# Patient Record
Sex: Male | Born: 1935 | Race: White | Hispanic: No | Smoking: Former smoker
Health system: Southern US, Community
[De-identification: ages and names within clinical notes are randomized; demographics above are authoritative.]

## PROBLEM LIST (undated history)

## (undated) DIAGNOSIS — I313 Pericardial effusion (noninflammatory): Secondary | ICD-10-CM

## (undated) DIAGNOSIS — J309 Allergic rhinitis, unspecified: Secondary | ICD-10-CM

## (undated) DIAGNOSIS — F3289 Other specified depressive episodes: Secondary | ICD-10-CM

## (undated) DIAGNOSIS — E785 Hyperlipidemia, unspecified: Secondary | ICD-10-CM

## (undated) DIAGNOSIS — K5730 Diverticulosis of large intestine without perforation or abscess without bleeding: Secondary | ICD-10-CM

## (undated) DIAGNOSIS — F411 Generalized anxiety disorder: Secondary | ICD-10-CM

## (undated) DIAGNOSIS — I3139 Other pericardial effusion (noninflammatory): Secondary | ICD-10-CM

## (undated) DIAGNOSIS — F329 Major depressive disorder, single episode, unspecified: Secondary | ICD-10-CM

## (undated) DIAGNOSIS — I739 Peripheral vascular disease, unspecified: Secondary | ICD-10-CM

## (undated) DIAGNOSIS — N400 Enlarged prostate without lower urinary tract symptoms: Secondary | ICD-10-CM

## (undated) DIAGNOSIS — M109 Gout, unspecified: Secondary | ICD-10-CM

## (undated) DIAGNOSIS — H269 Unspecified cataract: Secondary | ICD-10-CM

## (undated) DIAGNOSIS — M1990 Unspecified osteoarthritis, unspecified site: Secondary | ICD-10-CM

## (undated) DIAGNOSIS — E119 Type 2 diabetes mellitus without complications: Secondary | ICD-10-CM

## (undated) DIAGNOSIS — I10 Essential (primary) hypertension: Secondary | ICD-10-CM

## (undated) DIAGNOSIS — K219 Gastro-esophageal reflux disease without esophagitis: Secondary | ICD-10-CM

## (undated) HISTORY — DX: Unspecified osteoarthritis, unspecified site: M19.90

## (undated) HISTORY — DX: Type 2 diabetes mellitus without complications: E11.9

## (undated) HISTORY — DX: Generalized anxiety disorder: F41.1

## (undated) HISTORY — DX: Other specified depressive episodes: F32.89

## (undated) HISTORY — DX: Allergic rhinitis, unspecified: J30.9

## (undated) HISTORY — DX: Major depressive disorder, single episode, unspecified: F32.9

## (undated) HISTORY — DX: Gout, unspecified: M10.9

## (undated) HISTORY — DX: Essential (primary) hypertension: I10

## (undated) HISTORY — DX: Hyperlipidemia, unspecified: E78.5

## (undated) HISTORY — DX: Unspecified cataract: H26.9

## (undated) HISTORY — DX: Gastro-esophageal reflux disease without esophagitis: K21.9

## (undated) HISTORY — DX: Benign prostatic hyperplasia without lower urinary tract symptoms: N40.0

## (undated) HISTORY — DX: Diverticulosis of large intestine without perforation or abscess without bleeding: K57.30

## (undated) HISTORY — DX: Peripheral vascular disease, unspecified: I73.9

---
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
## 1998-06-06 ENCOUNTER — Encounter: Payer: Self-pay | Admitting: *Deleted

## 1998-06-06 ENCOUNTER — Ambulatory Visit (HOSPITAL_COMMUNITY): Admission: RE | Admit: 1998-06-06 | Discharge: 1998-06-06 | Payer: Self-pay | Admitting: *Deleted

## 1998-07-09 ENCOUNTER — Encounter: Admission: RE | Admit: 1998-07-09 | Discharge: 1998-09-09 | Payer: Self-pay | Admitting: *Deleted

## 2000-06-10 ENCOUNTER — Encounter: Payer: Self-pay | Admitting: *Deleted

## 2000-06-10 ENCOUNTER — Ambulatory Visit (HOSPITAL_COMMUNITY): Admission: RE | Admit: 2000-06-10 | Discharge: 2000-06-10 | Payer: Self-pay | Admitting: *Deleted

## 2002-11-23 DIAGNOSIS — H269 Unspecified cataract: Secondary | ICD-10-CM

## 2003-06-12 DIAGNOSIS — I739 Peripheral vascular disease, unspecified: Secondary | ICD-10-CM

## 2003-10-12 ENCOUNTER — Encounter: Payer: Self-pay | Admitting: Internal Medicine

## 2003-10-18 ENCOUNTER — Ambulatory Visit (HOSPITAL_COMMUNITY): Admission: RE | Admit: 2003-10-18 | Discharge: 2003-10-18 | Payer: Self-pay | Admitting: Internal Medicine

## 2004-02-07 ENCOUNTER — Emergency Department (HOSPITAL_COMMUNITY): Admission: EM | Admit: 2004-02-07 | Discharge: 2004-02-07 | Payer: Self-pay | Admitting: Emergency Medicine

## 2004-06-13 DIAGNOSIS — F419 Anxiety disorder, unspecified: Secondary | ICD-10-CM

## 2005-10-16 ENCOUNTER — Encounter: Payer: Self-pay | Admitting: Internal Medicine

## 2005-10-16 LAB — CONVERTED CEMR LAB
Albumin: 3.9 g/dL
CO2: 25 meq/L
Chloride: 105 meq/L
Cholesterol: 168 mg/dL
Creatinine, Ser: 1.3 mg/dL
HDL: 50 mg/dL
LDL Cholesterol: 92 mg/dL
Potassium: 4.2 meq/L
Sodium: 143 meq/L
Total Protein: 6.1 g/dL
Triglyceride fasting, serum: 130 mg/dL

## 2006-01-20 DIAGNOSIS — R5381 Other malaise: Secondary | ICD-10-CM | POA: Insufficient documentation

## 2006-01-20 DIAGNOSIS — R5383 Other fatigue: Secondary | ICD-10-CM

## 2007-01-10 ENCOUNTER — Encounter: Payer: Self-pay | Admitting: Internal Medicine

## 2007-01-10 LAB — CONVERTED CEMR LAB
ALT: 18 units/L
AST: 22 units/L
CO2: 22 meq/L
Calcium: 9.3 mg/dL
Glucose, Bld: 128 mg/dL
HDL: 50 mg/dL
Hgb A1c MFr Bld: 5.6 %
LDL Cholesterol: 88 mg/dL
Potassium: 4.1 meq/L
Triglyceride fasting, serum: 119 mg/dL

## 2007-07-11 ENCOUNTER — Encounter: Payer: Self-pay | Admitting: Internal Medicine

## 2007-07-11 LAB — CONVERTED CEMR LAB
Platelets: 229 10*3/uL
WBC: 6.2 10*3/uL

## 2008-01-10 ENCOUNTER — Encounter: Payer: Self-pay | Admitting: Internal Medicine

## 2008-01-10 LAB — CONVERTED CEMR LAB
BUN: 28 mg/dL
Chloride: 109 meq/L
HDL: 54 mg/dL
LDL Cholesterol: 77 mg/dL
Potassium: 4.5 meq/L

## 2008-04-27 ENCOUNTER — Emergency Department (HOSPITAL_COMMUNITY): Admission: EM | Admit: 2008-04-27 | Discharge: 2008-04-27 | Payer: Self-pay | Admitting: Emergency Medicine

## 2008-06-03 ENCOUNTER — Encounter: Payer: Self-pay | Admitting: Internal Medicine

## 2009-01-07 ENCOUNTER — Encounter: Payer: Self-pay | Admitting: Internal Medicine

## 2009-01-07 LAB — CONVERTED CEMR LAB
BUN: 22 mg/dL
CO2: 26 meq/L
Calcium: 9.5 mg/dL
Chloride: 104 meq/L
Creatinine, Ser: 1.43 mg/dL
Glucose, Bld: 102 mg/dL
Hgb A1c MFr Bld: 5.9 %
Potassium: 4 meq/L
Sodium: 144 meq/L

## 2009-02-07 DIAGNOSIS — E119 Type 2 diabetes mellitus without complications: Secondary | ICD-10-CM | POA: Insufficient documentation

## 2009-02-07 DIAGNOSIS — M109 Gout, unspecified: Secondary | ICD-10-CM

## 2009-02-07 DIAGNOSIS — I10 Essential (primary) hypertension: Secondary | ICD-10-CM

## 2009-02-11 ENCOUNTER — Ambulatory Visit: Payer: Self-pay | Admitting: Internal Medicine

## 2009-02-11 DIAGNOSIS — J309 Allergic rhinitis, unspecified: Secondary | ICD-10-CM | POA: Insufficient documentation

## 2009-02-11 DIAGNOSIS — K219 Gastro-esophageal reflux disease without esophagitis: Secondary | ICD-10-CM | POA: Insufficient documentation

## 2009-02-11 DIAGNOSIS — N400 Enlarged prostate without lower urinary tract symptoms: Secondary | ICD-10-CM | POA: Insufficient documentation

## 2009-02-11 DIAGNOSIS — E785 Hyperlipidemia, unspecified: Secondary | ICD-10-CM

## 2009-02-11 DIAGNOSIS — M25519 Pain in unspecified shoulder: Secondary | ICD-10-CM

## 2009-03-19 ENCOUNTER — Encounter: Payer: Self-pay | Admitting: Internal Medicine

## 2009-03-19 DIAGNOSIS — K5730 Diverticulosis of large intestine without perforation or abscess without bleeding: Secondary | ICD-10-CM | POA: Insufficient documentation

## 2009-03-19 DIAGNOSIS — F329 Major depressive disorder, single episode, unspecified: Secondary | ICD-10-CM | POA: Insufficient documentation

## 2009-03-19 DIAGNOSIS — M1990 Unspecified osteoarthritis, unspecified site: Secondary | ICD-10-CM | POA: Insufficient documentation

## 2009-04-18 LAB — HM DIABETES EYE EXAM: HM Diabetic Eye Exam: NORMAL

## 2009-06-11 ENCOUNTER — Ambulatory Visit: Payer: Self-pay | Admitting: Internal Medicine

## 2009-09-17 ENCOUNTER — Ambulatory Visit: Payer: Self-pay | Admitting: Internal Medicine

## 2009-09-17 LAB — CONVERTED CEMR LAB
ALT: 13 units/L (ref 0–53)
BUN: 22 mg/dL (ref 6–23)
Basophils Relative: 0.2 % (ref 0.0–3.0)
Bilirubin, Direct: 0.2 mg/dL (ref 0.0–0.3)
CO2: 27 meq/L (ref 19–32)
Eosinophils Absolute: 0.3 10*3/uL (ref 0.0–0.7)
Eosinophils Relative: 3.2 % (ref 0.0–5.0)
GFR calc non Af Amer: 58.48 mL/min (ref >60)
Glucose, Bld: 107 mg/dL — ABNORMAL HIGH (ref 70–99)
HDL: 47.8 mg/dL (ref >39.00)
LDL Cholesterol: 75 mg/dL (ref 0–99)
Lymphocytes Relative: 16.9 % (ref 12.0–46.0)
Neutrophils Relative %: 72.8 % (ref 43.0–77.0)
Platelets: 202 10*3/uL (ref 150.0–400.0)
Potassium: 4.2 meq/L (ref 3.5–5.1)
RBC: 4.35 M/uL (ref 4.22–5.81)
Sodium: 146 meq/L — ABNORMAL HIGH (ref 135–145)
TSH: 1.47 microintl units/mL (ref 0.35–5.50)
Total Bilirubin: 0.6 mg/dL (ref 0.3–1.2)
VLDL: 14.8 mg/dL (ref 0.0–40.0)
WBC: 8.2 10*3/uL (ref 4.5–10.5)

## 2009-11-05 ENCOUNTER — Ambulatory Visit: Payer: Self-pay | Admitting: Internal Medicine

## 2010-01-06 ENCOUNTER — Telehealth: Payer: Self-pay | Admitting: Internal Medicine

## 2010-01-21 ENCOUNTER — Ambulatory Visit: Payer: Self-pay | Admitting: Internal Medicine

## 2010-03-04 ENCOUNTER — Ambulatory Visit
Admission: RE | Admit: 2010-03-04 | Discharge: 2010-03-04 | Payer: Self-pay | Source: Home / Self Care | Attending: Internal Medicine | Admitting: Internal Medicine

## 2010-03-13 NOTE — Assessment & Plan Note (Signed)
Summary: FLU SHOT Luis Glenn Natale Milch  Nurse Visit   Vitals Entered By: Orlan Leavens RMA (November 05, 2009 1:30 PM)  Allergies: 1)  ! Penicillin  Orders Added: 1)  Flu Vaccine 25yrs + MEDICARE PATIENTS [Q2039] 2)  Administration Flu vaccine - MCR [G0008] Flu Vaccine Consent Questions     Do you have a history of severe allergic reactions to this vaccine? no    Any prior history of allergic reactions to egg and/or gelatin? no    Do you have a sensitivity to the preservative Thimersol? no    Do you have a past history of Guillan-Barre Syndrome? no    Do you currently have an acute febrile illness? no    Have you ever had a severe reaction to latex? no    Vaccine information given and explained to patient? yes    Are you currently pregnant? no    Lot Number:[id number]   Exp Date:08/09/2010   Site Given  Left Deltoid IM  .lbmedflu

## 2010-03-13 NOTE — Letter (Signed)
Summary: [facility]  [facility]   Imported By: Lester Clarcona 03/21/2009 07:52:09  _____________________________________________________________________  External Attachment:    Type:   Image     Comment:   External Document

## 2010-03-13 NOTE — Assessment & Plan Note (Signed)
Summary: FU Natale Milch   Vital Signs:  Patient profile:   77 year old male Height:      74 inches (187.96 cm) Weight:      236.12 pounds (107.33 kg) O2 Sat:      95 % on Room air Temp:     98.0 degrees F (36.67 degrees C) oral Pulse rate:   81 / minute BP sitting:   120 / 62  (left arm) Cuff size:   large  Vitals Entered By: Orlan Leavens (Jun 11, 2009 2:38 PM)  O2 Flow:  Room air CC: cough/ nasal drainage Is Patient Diabetic? No Pain Assessment Patient in pain? no        Primary Care Provider:  Newt Lukes MD  CC:  cough/ nasal drainage.  History of Present Illness: here for followup -   1) c/o nasal congestion and dry cough symptoms worse at night - seasonal with spring allegies - has taken OTC afrin with good relief intially - now seems worse congestion than before - no fever, no sinus pain or pressure  2) DM2 - 100% compliant with meds as rx'd does not regularly check sugar at home - no symptoms hyper or hypo -glycemia -  3) HTN - reports compliance with ongoing medical treatment and no changes in medication dose or frequency. denies adverse side effects related to current therapy.  no CP, HA or vision changes - chroinc swellling in ankles without recent changes-   4) dyslipidemia - reports compliance with ongoing medical treatment and no changes in medication dose or frequency. denies adverse side effects related to current therapy.  not fasting today   Clinical Review Panels:  Immunizations   Last Tetanus Booster:  Historical (04/27/2008)   Last Flu Vaccine:  Historical (11/09/2008)   Last Pneumovax:  Historical (02/09/2002)  Lipid Management   Cholesterol:  147 (01/10/2008)   LDL (bad choesterol):  77 (01/10/2008)   HDL (good cholesterol):  54 (01/10/2008)   Triglycerides:  81 (01/10/2008)  Diabetes Management   HgBA1C:  5.9 (01/07/2009)   Creatinine:  1.43 (01/07/2009)   Last Dilated Eye Exam:  normal (04/18/2009)   Last Foot Exam:  yes  (06/11/2009)   Last Flu Vaccine:  Historical (11/09/2008)   Last Pneumovax:  Historical (02/09/2002)  CBC   WBC:  6.2 (07/11/2007)   Hgb:  13.3 (07/11/2007)   Hct:  38.7 (07/11/2007)   Platelets:  229 (07/11/2007)   MCV  89 (07/11/2007)  Complete Metabolic Panel   Glucose:  102 (01/07/2009)   Sodium:  144 (01/07/2009)   Potassium:  4.0 (01/07/2009)   Chloride:  104 (01/07/2009)   CO2:  26 (01/07/2009)   BUN:  22 (01/07/2009)   Creatinine:  1.43 (01/07/2009)   Albumin:  4.0 (01/10/2007)   Total Protein:  6.3 (01/10/2007)   Calcium:  9.5 (01/07/2009)   Total Bili:  0.5 (01/10/2007)   Alk Phos:  74 (01/10/2007)   SGPT (ALT):  18 (01/10/2007)   SGOT (AST):  22 (01/10/2007)   Current Medications (verified): 1)  Metformin Hcl 500 Mg Tabs (Metformin Hcl) .... Take 1 By Mouth Once Daily 2)  Lotrel 10-20 Mg Caps (Amlodipine Besy-Benazepril Hcl) .... Take 1 By Mouth Qd 3)  Furosemide 40 Mg Tabs (Furosemide) .... Take 1 By Mouth Qd 4)  Allopurinol 300 Mg Tabs (Allopurinol) .... Take 1 By Mouth Qd 5)  Simvastatin 40 Mg Tabs (Simvastatin) .... Take 1 By Mouth Qd 6)  Terazosin Hcl 10 Mg Caps (Terazosin  Hcl) .... Take 1 By Mouth Once Daily 7)  Sb Naproxen Sodium 220 Mg Tabs (Naproxen Sodium) .... Take 2 By Mouth Qd 8)  Lunesta 2 Mg Tabs (Eszopiclone) .... Take 1 At Bedtime As Needed 9)  Multivitamins  Tabs (Multiple Vitamin) .... Once Daily  Allergies (verified): 1)  ! Penicillin  Past History:  Past Medical History: Diabetes mellitus, type II Gout Hypertension Allergic rhinitis GERD Hyperlipidemia Benign prostatic hypertrophy Diverticulosis, colon Depression Osteoarthritis  Md rooster: - optho - hecker  Review of Systems  The patient denies weight loss, vision loss, decreased hearing, hoarseness, headaches, and abdominal pain.    Physical Exam  General:  alert, well-developed, well-nourished, and cooperative to examination.   overweight-appearing.   Eyes:  vision  grossly intact; pupils equal, round and reactive to light.  conjunctiva and lids normal.    Ears:  normal pinnae bilaterally, without erythema, swelling, or tenderness to palpation. TMs clear, without effusion, or cerumen impaction. Hearing grossly normal bilaterally  Mouth:  teeth and gums in good repair; mucous membranes moist, without lesions or ulcers. oropharynx clear without exudate, no erythema. +PND, clear Lungs:  normal respiratory effort, no intercostal retractions or use of accessory muscles; normal breath sounds bilaterally - no crackles and no wheezes.    Heart:  normal rate, regular rhythm, no murmur, and no rub. BLE with 1+ chroinc edema to mid-shins.  Neurologic:  alert & oriented X3 and cranial nerves II-XII symetrically intact.  strength normal in all extremities, sensation intact to light touch, and gait normal. speech fluent without dysarthria or aphasia; follows commands with good comprehension.   Diabetes Management Exam:    Foot Exam (with socks and/or shoes not present):       Sensory-Pinprick/Light touch:          Left medial foot (L-4): normal          Left dorsal foot (L-5): normal          Left lateral foot (S-1): normal          Right medial foot (L-4): normal          Right dorsal foot (L-5): normal          Right lateral foot (S-1): normal       Sensory-Monofilament:          Left foot: normal          Right foot: normal       Inspection:          Left foot: normal          Right foot: normal       Nails:          Left foot: normal          Right foot: normal   Impression & Recommendations:  Problem # 1:  DIABETES MELLITUS, TYPE II (ICD-250.00)  His updated medication list for this problem includes:    Metformin Hcl 500 Mg Tabs (Metformin hcl) .Marland Kitchen... Take 1 by mouth once daily    Lotrel 10-20 Mg Caps (Amlodipine besy-benazepril hcl) .Marland Kitchen... Take 1 by mouth qd  Orders: TLB-A1C / Hgb A1C (Glycohemoglobin) (83036-A1C) TLB-Creatinine, Blood  (82565-CREA)  Labs Reviewed: Creat: 1.43 (01/07/2009)     Last Eye Exam: normal (04/18/2009) Reviewed HgBA1c results: 5.9 (01/07/2009)  5.9 (01/10/2008)  Problem # 2:  HYPERTENSION (ICD-401.9)  His updated medication list for this problem includes:    Lotrel 10-20 Mg Caps (Amlodipine besy-benazepril hcl) .Marland Kitchen... Take 1 by mouth qd  Furosemide 40 Mg Tabs (Furosemide) .Marland Kitchen... Take 1 by mouth qd    Terazosin Hcl 10 Mg Caps (Terazosin hcl) .Marland Kitchen... Take 1 by mouth once daily  Orders: TLB-Creatinine, Blood (82565-CREA)  BP today: 120/62 Prior BP: 148/64 (02/11/2009)  Labs Reviewed: K+: 4.0 (01/07/2009) Creat: : 1.43 (01/07/2009)   Chol: 147 (01/10/2008)   HDL: 54 (01/10/2008)   LDL: 77 (01/10/2008)   TG: 81 (01/10/2008)  Problem # 3:  HYPERLIPIDEMIA (ICD-272.4)  not fasting now - will plan AM apt to check next vist -  prev values 2009 good - cont same  His updated medication list for this problem includes:    Simvastatin 40 Mg Tabs (Simvastatin) .Marland Kitchen... Take 1 by mouth qd  Labs Reviewed: SGOT: 22 (01/10/2007)   SGPT: 18 (01/10/2007)   HDL:54 (01/10/2008), 50 (01/10/2007)  LDL:77 (01/10/2008), 88 (01/10/2007)  Chol:147 (01/10/2008), 162 (01/10/2007)  Trig:81 (01/10/2008), 119 (01/10/2007)  Problem # 4:  ALLERGIC RHINITIS (ICD-477.9)  add meds for same and stop Afrin due to rebound and worsening of congestion His updated medication list for this problem includes:    Fluticasone Propionate 50 Mcg/act Susp (Fluticasone propionate) .Marland Kitchen... 2 sprays each nostril  every morning    Claritin 10 Mg Tabs (Loratadine) .Marland Kitchen... 1 by mouth once daily x 7 days, tehn as needed for allergy symptoms  Discussed use of allergy medications and environmental measures.   Orders: Prescription Created Electronically [phone])  Complete Medication List: 1)  Metformin Hcl 500 Mg Tabs (Metformin hcl) .... Take 1 by mouth once daily 2)  Lotrel 10-20 Mg Caps (Amlodipine besy-benazepril hcl) .... Take 1 by mouth  qd 3)  Furosemide 40 Mg Tabs (Furosemide) .... Take 1 by mouth qd 4)  Allopurinol 300 Mg Tabs (Allopurinol) .... Take 1 by mouth qd 5)  Simvastatin 40 Mg Tabs (Simvastatin) .... Take 1 by mouth qd 6)  Terazosin Hcl 10 Mg Caps (Terazosin hcl) .... Take 1 by mouth once daily 7)  Sb Naproxen Sodium 220 Mg Tabs (Naproxen sodium) .... Take 2 by mouth qd 8)  Zolpidem Tartrate 6.25 Mg Cr-tabs (Zolpidem tartrate) .Marland Kitchen.. 1 by mouth at bedtime as needed for sleep 9)  Multivitamins Tabs (Multiple vitamin) .... Once daily 10)  Fluticasone Propionate 50 Mcg/act Susp (Fluticasone propionate) .... 2 sprays each nostril  every morning 11)  Claritin 10 Mg Tabs (Loratadine) .Marland Kitchen.. 1 by mouth once daily x 7 days, tehn as needed for allergy symptoms  Patient Instructions: 1)  it was good to see you today. 2)  test(s) ordered today - your results will be posted on the phone tree for review in 48-72 hours from the time of test completion; call [phone] and enter your 9 digit MRN (listed above on this page, just below your name); if any changes need to be made or there are abnormal results, you will be contacted directly.  3)  add nasal steroids (generic flonase) and claritin for allergy symptoms - your prescriptions have been electronically submitted to your pharmacy. Please take as directed. Contact our office if you believe you're having problems with the medication(s). 4)  stop afrin 5)  change lunesta to generic extended release ambein - prescription provided 6)  Please schedule a follow-up appointment in 3-4 months, sooner if problems. come in AM fasting so we can check cholesterol next visit Prescriptions: CLARITIN 10 MG TABS (LORATADINE) 1 by mouth once daily x 7 days, tehn as needed for allergy symptoms  #30 x 2   Entered and Authorized by:  Newt Lukes MD   Signed by:   Newt Lukes MD on 06/11/2009   Method used:   Electronically to        [facility] [address] (retail)        [address]       [place], [place]  [address]       Ph: [phone]       Fax: [phone]   RxID:   [id number] FLUTICASONE PROPIONATE 50 MCG/ACT SUSP (FLUTICASONE PROPIONATE) 2 sprays each nostril  every morning  #1 x 5   Entered and Authorized by:   Newt Lukes MD   Signed by:   Newt Lukes MD on 06/11/2009   Method used:   Electronically to        [facility] [id number] (retail)       [address]       [place], [place]  [address]       Ph: [phone]       Fax: [phone]   RxID:   [id number] ZOLPIDEM TARTRATE 6.25 MG CR-TABS (ZOLPIDEM TARTRATE) 1 by mouth at bedtime as needed for sleep  #30 x 5   Entered and Authorized by:   Newt Lukes MD   Signed by:   Newt Lukes MD on 06/11/2009   Method used:   Print then Give to Patient   RxID:   [id number]

## 2010-03-13 NOTE — Letter (Signed)
Summary: [facility]   Imported By: Lester  03/21/2009 07:50:31  _____________________________________________________________________  External Attachment:    Type:   Image     Comment:   External Document

## 2010-03-13 NOTE — Assessment & Plan Note (Signed)
Summary: NEW MEDICARE PT --#--PKG--STC   Vital Signs:  Patient profile:   77 year old male Height:      74 inches (187.96 cm) Weight:      234.4 pounds (106.55 kg) BMI:     30.20 O2 Sat:      95 % on Room air Temp:     97.9 degrees F (36.61 degrees C) oral Pulse rate:   86 / minute BP sitting:   148 / 64  (left arm) Cuff size:   large  Vitals Entered By: Orlan Leavens (February 11, 2009 9:35 AM)  O2 Flow:  Room air CC: New patient Is Patient Diabetic? Yes Did you bring your meter with you today? No Pain Assessment Patient in pain? no        Primary Care Provider:  Newt Lukes MD  CC:  New patient.  History of Present Illness: new pt to me and to our divison - here to est care prev follwed with dr. Rondel Baton  1) DM2 - reports recent a1c 5.7 last month, never over 6 - does not regularly check sugar at home - no symptoms hyperglycemia - ?if he needs meds for this dx  2) HTN - reports compliance with ongoing medical treatment and no changes in medication dose or frequency. denies adverse side effects related to current therapy.  no CP, HA or vision changes - chroinc swellling in ankles without recent changes-   3) dyslipidemia - reports compliance with ongoing medical treatment and no changes in medication dose or frequency. denies adverse side effects related to current therapy.   4) c/o left shoulder pain - onset 04/2008 - pain initially precipiated by fall last spring -  pain is intermittently ptresent, induced by movement but not any consistent activity or motiotn - pain 3/10 when present "bugs me but not severe" - no radiation to chest, arm or hand -  no weakness not improved with ice, heat, position change -  not consitstent enough to try tylenol  Preventive Screening-Counseling & Management  Alcohol-Tobacco     Alcohol drinks/day: 1     Alcohol Counseling: not indicated; use of alcohol is not excessive or problematic     Smoking Status: never  Tobacco Counseling: not indicated; no tobacco use  Caffeine-Diet-Exercise     Caffeine Counseling: not indicated; caffeine use is not excessive or problematic     Does Patient Exercise: no     Exercise Counseling: to improve exercise regimen     Depression Counseling: not indicated; screening negative for depression  Clinical Review Panels:  Prevention   Last Colonoscopy:  Location:  [facility].  Finding: Diverticulosis of sigmoid colon otherwise unremarkable examination (06/10/2000)  Immunizations   Last Tetanus Booster:  Historical (04/27/2008)   Last Flu Vaccine:  Historical (11/09/2008)   Last Pneumovax:  Historical (02/09/2002)   Current Medications (verified): 1)  Metformin Hcl 500 Mg Tabs (Metformin Hcl) .... Take 1 Two Times A Day 2)  Lotrel 10-20 Mg Caps (Amlodipine Besy-Benazepril Hcl) .... Take 1 By Mouth Qd 3)  Furosemide 40 Mg Tabs (Furosemide) .... Take 1 By Mouth Qd 4)  Allopurinol 300 Mg Tabs (Allopurinol) .... Take 1 By Mouth Qd 5)  Simvastatin 40 Mg Tabs (Simvastatin) .... Take 1 By Mouth Qd 6)  Terazosin Hcl 10 Mg Caps (Terazosin Hcl) .... Take 1 By Mouth Once Daily 7)  Sb Naproxen Sodium 220 Mg Tabs (Naproxen Sodium) .... Take 2 By Mouth Qd 8)  Lunesta 2  Mg Tabs (Eszopiclone) .... Take 1 At Bedtime As Needed  Allergies (verified): 1)  ! Penicillin  Past History:  Past Medical History: Diabetes mellitus, type II Gout Hypertension Allergic rhinitis GERD Hyperlipidemia Benign prostatic hypertrophy  Past Surgical History: Tonsillectomy [phone])  Family History: Family History of Alcoholism/Addiction (other relative) Family History Breast cancer 1st degree relative <50 (other relativ) Family History Diabetes 1st degree relative ( parent) Family History Hypertension (parent)  Social History: Never Smoked married, lives with wife - occ alcohol use retiredSmoking Status:  never Does Patient Exercise:  no  Review of Systems       see  HPI above. I have reviewed all other systems and they were negative.   Physical Exam  General:  alert, well-developed, well-nourished, and cooperative to examination.   overweight-appearing.   Eyes:  vision grossly intact; pupils equal, round and reactive to light.  conjunctiva and lids normal.    Ears:  normal pinnae bilaterally, without erythema, swelling, or tenderness to palpation. TMs clear, without effusion, or cerumen impaction. Hearing grossly normal bilaterally  Mouth:  teeth and gums in good repair; mucous membranes moist, without lesions or ulcers. oropharynx clear without exudate, no erythema.  Lungs:  normal respiratory effort, no intercostal retractions or use of accessory muscles; normal breath sounds bilaterally - no crackles and no wheezes.    Heart:  normal rate, regular rhythm, no murmur, and no rub. BLE with 1+ chroinc edema to mid-shins. normal DP pulses and normal cap refill in all 4 extremities    Abdomen:  obese, soft, non-tender, normal bowel sounds, no distention; no masses and no appreciable hepatomegaly or splenomegaly.   Msk:  left shoulder: Full range of motion. full strength with testing rotator cuff. Negative impingement signs. Nontender to palpation but priominate anterior location of humeral head as compared to right. Neurovascularly intact Neurologic:  alert & oriented X3 and cranial nerves II-XII symetrically intact.  strength normal in all extremities, sensation intact to light touch, and gait normal. speech fluent without dysarthria or aphasia; follows commands with good comprehension.  Skin:  no rashes, vesicles, ulcers, or erythema. No nodules or irregularity to palpation.  Psych:  Oriented X3, memory intact for recent and remote, normally interactive, good eye contact, not anxious appearing, not depressed appearing, and not agitated.      Impression & Recommendations:  Problem # 1:  SHOULDER PAIN, LEFT, CHRONIC (ICD-719.41) ?remote fx or injury from fall  3/10 - ER record reviewed - shoulder not imaged at time of fall but other ct head/neck ok - check xray now -  FROM and not reproducible pain today so doubt benefit ot PT or NSAIDs/Tylenol on regular scheduled basis (other than as ongoing) further eval to depend on xray and course of symptoms - reassurance provided today His updated medication list for this problem includes:    Sb Naproxen Sodium 220 Mg Tabs (Naproxen sodium) .Marland Kitchen... Take 2 by mouth qd  Orders: T-Shoulder Left Min 2 Views (73030TC)  Problem # 2:  DIABETES MELLITUS, TYPE II (ICD-250.00) reports well controlled by a1c last mo - ok to reduce metformin to once daily dosing and recheck a1c next vuisit send for records to review from prior PCP His updated medication list for this problem includes:    Metformin Hcl 500 Mg Tabs (Metformin hcl) .Marland Kitchen... Take 1 by mouth once daily    Lotrel 10-20 Mg Caps (Amlodipine besy-benazepril hcl) .Marland Kitchen... Take 1 by mouth qd  Problem # 3:  HYPERTENSION (ICD-401.9) subop BP today  but reports generally well contorlled - no med change for now -  send for and review prior reciods - encouraged compliance and rech next OV 3-6 mos His updated medication list for this problem includes:    Lotrel 10-20 Mg Caps (Amlodipine besy-benazepril hcl) .Marland Kitchen... Take 1 by mouth qd    Furosemide 40 Mg Tabs (Furosemide) .Marland Kitchen... Take 1 by mouth qd    Terazosin Hcl 10 Mg Caps (Terazosin hcl) .Marland Kitchen... Take 1 by mouth once daily  BP today: 148/64  Problem # 4:  HYPERLIPIDEMIA (ICD-272.4) send for and review labs/old records - no med change rec for now His updated medication list for this problem includes:    Simvastatin 40 Mg Tabs (Simvastatin) .Marland Kitchen... Take 1 by mouth qd  Complete Medication List: 1)  Metformin Hcl 500 Mg Tabs (Metformin hcl) .... Take 1 by mouth once daily 2)  Lotrel 10-20 Mg Caps (Amlodipine besy-benazepril hcl) .... Take 1 by mouth qd 3)  Furosemide 40 Mg Tabs (Furosemide) .... Take 1 by mouth qd 4)   Allopurinol 300 Mg Tabs (Allopurinol) .... Take 1 by mouth qd 5)  Simvastatin 40 Mg Tabs (Simvastatin) .... Take 1 by mouth qd 6)  Terazosin Hcl 10 Mg Caps (Terazosin hcl) .... Take 1 by mouth once daily 7)  Sb Naproxen Sodium 220 Mg Tabs (Naproxen sodium) .... Take 2 by mouth qd 8)  Lunesta 2 Mg Tabs (Eszopiclone) .... Take 1 at bedtime as needed  Patient Instructions: 1)  it was good to see you today.  2)  xray of left shoulder ordered today - your results will be called to you in 48-72 hours from the time of test completion 3)  will send for old medical records (labs, notes, etc) from dr. Rondel Baton office to update our files - 4)  reduce your metformin to once daily as discussed  5)  Please schedule a follow-up appointment in 3-6 months, sooner if problems. well recheck labs at that time   Immunization History:  Tetanus/Td Immunization History:    Tetanus/Td:  historical (04/27/2008)  Influenza Immunization History:    Influenza:  historical (11/09/2008)  Pneumovax Immunization History:    Pneumovax:  historical (02/09/2002)

## 2010-03-13 NOTE — Assessment & Plan Note (Signed)
Summary: 6 weeks follow up-lb   Vital Signs:  Patient profile:   77 year old male Height:      74 inches (187.96 cm) Weight:      239.4 pounds (108.82 kg) O2 Sat:      95 % on Room air Temp:     97.4 degrees F (36.33 degrees C) oral Pulse rate:   73 / minute BP sitting:   148 / 70  (left arm) Cuff size:   large  Vitals Entered By: Orlan Leavens RMA (March 04, 2010 1:36 PM)  O2 Flow:  Room air CC: 6 week follow-up Is Patient Diabetic? Yes Did you bring your meter with you today? No Pain Assessment Patient in pain? no        Primary Care Provider:  Newt Lukes MD  CC:  6 week follow-up.  History of Present Illness: here for followup -   1) depression - manifest as irritability and fatigue complicated by exhaustion with care for wife -  started wellbutrin 01/2010 for same -  better sleep, no tearfulness, less overwhelming/unexplained sadness  2) DM2 - advised to stop metformin 09/2009 due to well controlled a1c - but has continued on same with diet mgmt- does not regularly check sugar at home - no symptoms hyper or hypo -glycemia -  3) HTN - reports compliance with ongoing medical treatment and no changes in medication dose or frequency. denies adverse side effects related to current therapy.  no CP, HA or vision changes - chronic swellling in ankles without recent changes-   4) dyslipidemia - reports compliance with ongoing medical treatment and no changes in medication dose or frequency. denies adverse side effects related to current therapy.  fasting today   Clinical Review Panels:  Diabetes Management   HgBA1C:  5.6 (09/17/2009)   Creatinine:  1.3 (09/17/2009)   Last Dilated Eye Exam:  normal (04/18/2009)   Last Foot Exam:  yes (06/11/2009)   Last Flu Vaccine:  Fluvax 3+ (11/05/2009)   Last Pneumovax:  Historical (02/09/2002)  CBC   WBC:  8.2 (09/17/2009)   RBC:  4.35 (09/17/2009)   Hgb:  13.6 (09/17/2009)   Hct:  40.1 (09/17/2009)   Platelets:   202.0 (09/17/2009)   MCV  92.2 (09/17/2009)   MCHC  33.9 (09/17/2009)   RDW  15.1 (09/17/2009)   PMN:  72.8 (09/17/2009)   Lymphs:  16.9 (09/17/2009)   Monos:  6.9 (09/17/2009)   Eosinophils:  3.2 (09/17/2009)   Basophil:  0.2 (09/17/2009)  Complete Metabolic Panel   Glucose:  107 (09/17/2009)   Sodium:  146 (09/17/2009)   Potassium:  4.2 (09/17/2009)   Chloride:  109 (09/17/2009)   CO2:  27 (09/17/2009)   BUN:  22 (09/17/2009)   Creatinine:  1.3 (09/17/2009)   Albumin:  3.9 (09/17/2009)   Total Protein:  6.3 (09/17/2009)   Calcium:  9.5 (09/17/2009)   Total Bili:  0.6 (09/17/2009)   Alk Phos:  84 (09/17/2009)   SGPT (ALT):  13 (09/17/2009)   SGOT (AST):  15 (09/17/2009)   Current Medications (verified): 1)  Lotrel 10-20 Mg Caps (Amlodipine Besy-Benazepril Hcl) .... Take 1 By Mouth Qd 2)  Furosemide 40 Mg Tabs (Furosemide) .... Take 1 By Mouth Qd 3)  Allopurinol 300 Mg Tabs (Allopurinol) .... Take 1 By Mouth Qd 4)  Simvastatin 40 Mg Tabs (Simvastatin) .... Take 1 By Mouth Qd 5)  Terazosin Hcl 10 Mg Caps (Terazosin Hcl) .... Take 1 By Mouth Once Daily 6)  Sb Naproxen Sodium 220 Mg Tabs (Naproxen Sodium) .... Take 2 By Mouth Qd 7)  Zolpidem Tartrate 6.25 Mg Cr-Tabs (Zolpidem Tartrate) .Marland Kitchen.. 1 By Mouth At Bedtime As Needed For Sleep 8)  Multivitamins  Tabs (Multiple Vitamin) .... Once Daily 9)  Metformin Hcl 500 Mg Tabs (Metformin Hcl) .Marland Kitchen.. 1 By Mouth Once Daily 10)  Wellbutrin Xl 150 Mg Xr24h-Tab (Bupropion Hcl) .Marland Kitchen.. 1 By Mouth Once Daily  Allergies (verified): 1)  ! Penicillin  Past History:  Past Medical History: Diabetes mellitus, type II Gout  Hypertension Allergic rhinitis GERD Hyperlipidemia Benign prostatic hypertrophy Diverticulosis, colon Depression Osteoarthritis    Md roster:  optho - hecker  Review of Systems  The patient denies anorexia, weight loss, chest pain, headaches, and abdominal pain.    Physical Exam  General:  alert, well-developed,  well-nourished, and cooperative to examination.   overweight-appearing.   Lungs:  normal respiratory effort, no intercostal retractions or use of accessory muscles; normal breath sounds bilaterally - no crackles and no wheezes.    Heart:  normal rate, regular rhythm, no murmur, and no rub. BLE with 1+ chroinc edema to mid-shins.  Psych:  Oriented X3, memory intact for recent and remote, normally interactive, good eye contact, not anxious appearing, not depressed appearing, and not agitated.      Impression & Recommendations:  Problem # 1:  DEPRESSION (ICD-311)  His updated medication list for this problem includes:    Wellbutrin Xl 150 Mg Xr24h-tab (Bupropion hcl) .Marland Kitchen... 1 by mouth once daily  situational and hx same - remotely on zoloft, started wellbutrin 01/2010 mild constipation SE but mood and irritability much improved - cont same  Complete Medication List: 1)  Lotrel 10-20 Mg Caps (Amlodipine besy-benazepril hcl) .... Take 1 by mouth qd 2)  Furosemide 40 Mg Tabs (Furosemide) .... Take 1 by mouth qd 3)  Allopurinol 300 Mg Tabs (Allopurinol) .... Take 1 by mouth qd 4)  Simvastatin 40 Mg Tabs (Simvastatin) .... Take 1 by mouth qd 5)  Terazosin Hcl 10 Mg Caps (Terazosin hcl) .... Take 1 by mouth once daily 6)  Sb Naproxen Sodium 220 Mg Tabs (Naproxen sodium) .... Take 2 by mouth qd 7)  Zolpidem Tartrate 6.25 Mg Cr-tabs (Zolpidem tartrate) .Marland Kitchen.. 1 by mouth at bedtime as needed for sleep 8)  Multivitamins Tabs (Multiple vitamin) .... Once daily 9)  Metformin Hcl 500 Mg Tabs (Metformin hcl) .Marland Kitchen.. 1 by mouth once daily 10)  Wellbutrin Xl 150 Mg Xr24h-tab (Bupropion hcl) .Marland Kitchen.. 1 by mouth once daily  Patient Instructions: 1)  it was good to see you today. 2)  continue wellbutrin for irritability - use miralax in AM and ducolax in PM to help counterbalance constipation symptoms as needed  3)  Please schedule a follow-up appointment in 3 months to review depression, diabetes and medications,  call sooner if problems.    Orders Added: 1)  Est. Patient Level III [29528]

## 2010-03-13 NOTE — Assessment & Plan Note (Signed)
Summary: 3-4 MO ROV /NWS  #   Vital Signs:  Patient profile:   77 year old male Height:      74 inches (187.96 cm) Weight:      245.8 pounds (111.73 kg) BMI:     31.67 O2 Sat:      97 % on Room air Temp:     96.0 degrees F (35.56 degrees C) oral Pulse rate:   71 / minute BP sitting:   130 / 60  (left arm) Cuff size:   large  Vitals Entered By: Orlan Leavens RMA (January 21, 2010 1:21 PM)  O2 Flow:  Room air CC: 3 month follow-up Is Patient Diabetic? Yes Did you bring your meter with you today? No Pain Assessment Patient in pain? no        Primary Care Provider:  Newt Lukes MD  CC:  3 month follow-up.  History of Present Illness: here for followup -   1) c/o fatigue- onset >6 months ago exhausted with care for wife -  admits to poor sleep but no tearfulness of overwhelming/unexplained sadness no prior hx depression but admits "i've wondered if that could be the problem sometimes" now ready to start med for same due to increase irritability -  2) DM2 - advised to stop metformin 09/2009 due to well controlled a1c - but has continued on same with diet mgmt- does not regularly check sugar at home - no symptoms hyper or hypo -glycemia -  3) HTN - reports compliance with ongoing medical treatment and no changes in medication dose or frequency. denies adverse side effects related to current therapy.  no CP, HA or vision changes - chronic swellling in ankles without recent changes-   4) dyslipidemia - reports compliance with ongoing medical treatment and no changes in medication dose or frequency. denies adverse side effects related to current therapy.  fasting today   Clinical Review Panels:  Lipid Management   Cholesterol:  138 (09/17/2009)   LDL (bad choesterol):  75 (09/17/2009)   HDL (good cholesterol):  47.80 (09/17/2009)   Triglycerides:  81 (01/10/2008)  Diabetes Management   HgBA1C:  5.6 (09/17/2009)   Creatinine:  1.3 (09/17/2009)   Last Dilated  Eye Exam:  normal (04/18/2009)   Last Foot Exam:  yes (06/11/2009)   Last Flu Vaccine:  Fluvax 3+ (11/05/2009)   Last Pneumovax:  Historical (02/09/2002)  CBC   WBC:  8.2 (09/17/2009)   RBC:  4.35 (09/17/2009)   Hgb:  13.6 (09/17/2009)   Hct:  40.1 (09/17/2009)   Platelets:  202.0 (09/17/2009)   MCV  92.2 (09/17/2009)   MCHC  33.9 (09/17/2009)   RDW  15.1 (09/17/2009)   PMN:  72.8 (09/17/2009)   Lymphs:  16.9 (09/17/2009)   Monos:  6.9 (09/17/2009)   Eosinophils:  3.2 (09/17/2009)   Basophil:  0.2 (09/17/2009)  Complete Metabolic Panel   Glucose:  107 (09/17/2009)   Sodium:  146 (09/17/2009)   Potassium:  4.2 (09/17/2009)   Chloride:  109 (09/17/2009)   CO2:  27 (09/17/2009)   BUN:  22 (09/17/2009)   Creatinine:  1.3 (09/17/2009)   Albumin:  3.9 (09/17/2009)   Total Protein:  6.3 (09/17/2009)   Calcium:  9.5 (09/17/2009)   Total Bili:  0.6 (09/17/2009)   Alk Phos:  84 (09/17/2009)   SGPT (ALT):  13 (09/17/2009)   SGOT (AST):  15 (09/17/2009)   Current Medications (verified): 1)  Lotrel 10-20 Mg Caps (Amlodipine Besy-Benazepril Hcl) .... Take 1  By Mouth Qd 2)  Furosemide 40 Mg Tabs (Furosemide) .... Take 1 By Mouth Qd 3)  Allopurinol 300 Mg Tabs (Allopurinol) .... Take 1 By Mouth Qd 4)  Simvastatin 40 Mg Tabs (Simvastatin) .... Take 1 By Mouth Qd 5)  Terazosin Hcl 10 Mg Caps (Terazosin Hcl) .... Take 1 By Mouth Once Daily 6)  Sb Naproxen Sodium 220 Mg Tabs (Naproxen Sodium) .... Take 2 By Mouth Qd 7)  Zolpidem Tartrate 6.25 Mg Cr-Tabs (Zolpidem Tartrate) .Marland Kitchen.. 1 By Mouth At Bedtime As Needed For Sleep 8)  Multivitamins  Tabs (Multiple Vitamin) .... Once Daily  Allergies (verified): 1)  ! Penicillin  Past History:  Past Medical History: Diabetes mellitus, type II Gout  Hypertension Allergic rhinitis GERD Hyperlipidemia Benign prostatic hypertrophy Diverticulosis, colon Depression Osteoarthritis   Md roster:  optho - hecker  Review of Systems  The  patient denies fever, weight loss, chest pain, and headaches.    Physical Exam  General:  alert, well-developed, well-nourished, and cooperative to examination.   overweight-appearing.   Lungs:  normal respiratory effort, no intercostal retractions or use of accessory muscles; normal breath sounds bilaterally - no crackles and no wheezes.    Heart:  normal rate, regular rhythm, no murmur, and no rub. BLE with 1+ chroinc edema to mid-shins.  Psych:  Oriented X3, memory intact for recent and remote, normally interactive, good eye contact, not anxious appearing, mildly depressed appearing, and not agitated.      Impression & Recommendations:  Problem # 1:  DIABETES MELLITUS, TYPE II (ICD-250.00)  His updated medication list for this problem includes:    Lotrel 10-20 Mg Caps (Amlodipine besy-benazepril hcl) .Marland Kitchen... Take 1 by mouth qd    Metformin Hcl 500 Mg Tabs (Metformin hcl) .Marland Kitchen... 1 by mouth once daily  last a1c great - continue same  Labs Reviewed: Creat: 1.3 (09/17/2009)     Last Eye Exam: normal (04/18/2009) Reviewed HgBA1c results: 5.6 (09/17/2009)  5.6 (06/11/2009)  Problem # 2:  DEPRESSION (ICD-311)  situational and hx same - remotely on zoloft start wellbutrin - potential and anticipated risks and benefits discussed - pt understands and agees His updated medication list for this problem includes:    Wellbutrin Xl 150 Mg Xr24h-tab (Bupropion hcl) .Marland Kitchen... 1 by mouth once daily  Orders: Prescription Created Electronically [id number])  Complete Medication List: 1)  Lotrel 10-20 Mg Caps (Amlodipine besy-benazepril hcl) .... Take 1 by mouth qd 2)  Furosemide 40 Mg Tabs (Furosemide) .... Take 1 by mouth qd 3)  Allopurinol 300 Mg Tabs (Allopurinol) .... Take 1 by mouth qd 4)  Simvastatin 40 Mg Tabs (Simvastatin) .... Take 1 by mouth qd 5)  Terazosin Hcl 10 Mg Caps (Terazosin hcl) .... Take 1 by mouth once daily 6)  Sb Naproxen Sodium 220 Mg Tabs (Naproxen sodium) .... Take 2 by mouth  qd 7)  Zolpidem Tartrate 6.25 Mg Cr-tabs (Zolpidem tartrate) .Marland Kitchen.. 1 by mouth at bedtime as needed for sleep 8)  Multivitamins Tabs (Multiple vitamin) .... Once daily 9)  Metformin Hcl 500 Mg Tabs (Metformin hcl) .Marland Kitchen.. 1 by mouth once daily 10)  Wellbutrin Xl 150 Mg Xr24h-tab (Bupropion hcl) .Marland Kitchen.. 1 by mouth once daily  Patient Instructions: 1)  it was good to see you today. 2)  continue metformin 3)  start wellbutrin for irritability 4)  your prescriptions and refills have been electronically submitted to your pharmacy. Please take as directed. Contact our office if you believe you're having problems with the medication(s).  5)  Please schedule a follow-up appointment in 6 weeks to review depression and medictions, call sooner if problems.  Prescriptions: WELLBUTRIN XL 150 MG XR24H-TAB (BUPROPION HCL) 1 by mouth once daily  #30 x 3   Entered and Authorized by:   Newt Lukes MD   Signed by:   Newt Lukes MD on 01/21/2010   Method used:   Electronically to        [facility] # [phone]* (retail)       [address]       [place], [place]  [address]       Ph: [phone]       Fax: [phone]   RxID:   [id number] METFORMIN HCL 500 MG TABS (METFORMIN HCL) 1 by mouth once daily  #30 x 6   Entered and Authorized by:   Newt Lukes MD   Signed by:   Newt Lukes MD on 01/21/2010   Method used:   Electronically to        [facility] # [phone]* (retail)       [address]       [place], [place]  [address]       Ph: [phone]       Fax: [phone]   RxID:   [id number]    Orders Added: 1)  Est. Patient Level IV [83151] 2)  Prescription Created Electronically [phone] [phone]

## 2010-03-13 NOTE — Assessment & Plan Note (Signed)
Summary: 3 mos f/u # / cd   Vital Signs:  Patient profile:   77 year old male Height:      74 inches (187.96 cm) Weight:      236.4 pounds (107.45 kg) O2 Sat:      94 % on Room air Temp:     97.6 degrees F (36.44 degrees C) oral Pulse rate:   68 / minute BP sitting:   158 / 62  (left arm) Cuff size:   regular  Vitals Entered By: Orlan Leavens RMA (September 17, 2009 8:46 AM)  O2 Flow:  Room air CC: 3 month follow-up Is Patient Diabetic? Yes Did you bring your meter with you today? No Pain Assessment Patient in pain? no        Primary Care Freeda Spivey:  Newt Lukes MD  CC:  3 month follow-up.  History of Present Illness: here for followup -   1) c/o fatigue- onset >6 months ago exhausted with care for wife -  admits to poor sleep but no tearfulness of overwhelming/unexplained sadness no prior hx depression but admits "i've wondered if that could be the problem sometimes"  2) DM2 - 100% compliant with meds as rx'd does not regularly check sugar at home - no symptoms hyper or hypo -glycemia - ?if might stop meds since last a1c so good?  3) HTN - reports compliance with ongoing medical treatment and no changes in medication dose or frequency. denies adverse side effects related to current therapy.  no CP, HA or vision changes - chronic swellling in ankles without recent changes-   4) dyslipidemia - reports compliance with ongoing medical treatment and no changes in medication dose or frequency. denies adverse side effects related to current therapy.  fasting today   Clinical Review Panels:  Prevention   Last Colonoscopy:  Location:  [facility].  Finding: Diverticulosis of sigmoid colon otherwise unremarkable examination (06/10/2000)  Lipid Management   Cholesterol:  147 (01/10/2008)   LDL (bad choesterol):  77 (01/10/2008)   HDL (good cholesterol):  54 (01/10/2008)   Triglycerides:  81 (01/10/2008)  Diabetes Management   HgBA1C:  5.6  (06/11/2009)   Creatinine:  1.4 (06/11/2009)   Last Dilated Eye Exam:  normal (04/18/2009)   Last Foot Exam:  yes (06/11/2009)   Last Flu Vaccine:  Historical (11/09/2008)   Last Pneumovax:  Historical (02/09/2002)  Complete Metabolic Panel   Glucose:  102 (01/07/2009)   Sodium:  144 (01/07/2009)   Potassium:  4.0 (01/07/2009)   Chloride:  104 (01/07/2009)   CO2:  26 (01/07/2009)   BUN:  22 (01/07/2009)   Creatinine:  1.4 (06/11/2009)   Albumin:  4.0 (01/10/2007)   Total Protein:  6.3 (01/10/2007)   Calcium:  9.5 (01/07/2009)   Total Bili:  0.5 (01/10/2007)   Alk Phos:  74 (01/10/2007)   SGPT (ALT):  18 (01/10/2007)   SGOT (AST):  22 (01/10/2007)   Current Medications (verified): 1)  Metformin Hcl 500 Mg Tabs (Metformin Hcl) .... Take 1 By Mouth Once Daily 2)  Lotrel 10-20 Mg Caps (Amlodipine Besy-Benazepril Hcl) .... Take 1 By Mouth Qd 3)  Furosemide 40 Mg Tabs (Furosemide) .... Take 1 By Mouth Qd 4)  Allopurinol 300 Mg Tabs (Allopurinol) .... Take 1 By Mouth Qd 5)  Simvastatin 40 Mg Tabs (Simvastatin) .... Take 1 By Mouth Qd 6)  Terazosin Hcl 10 Mg Caps (Terazosin Hcl) .... Take 1 By Mouth Once Daily 7)  Sb Naproxen Sodium 220  Mg Tabs (Naproxen Sodium) .... Take 2 By Mouth Qd 8)  Zolpidem Tartrate 6.25 Mg Cr-Tabs (Zolpidem Tartrate) .Marland Kitchen.. 1 By Mouth At Bedtime As Needed For Sleep 9)  Multivitamins  Tabs (Multiple Vitamin) .... Once Daily  Allergies (verified): 1)  ! Penicillin  Past History:  Past Medical History: Diabetes mellitus, type II Gout  Hypertension Allergic rhinitis GERD Hyperlipidemia Benign prostatic hypertrophy Diverticulosis, colon Depression Osteoarthritis  Md roster:  optho - hecker  Review of Systems  The patient denies weight loss, chest pain, syncope, headaches, and abdominal pain.    Physical Exam  General:  alert, well-developed, well-nourished, and cooperative to examination.   overweight-appearing.   Lungs:  normal respiratory  effort, no intercostal retractions or use of accessory muscles; normal breath sounds bilaterally - no crackles and no wheezes.    Heart:  normal rate, regular rhythm, no murmur, and no rub. BLE with 1+ chroinc edema to mid-shins.  Psych:  Oriented X3, memory intact for recent and remote, normally interactive, good eye contact, not anxious appearing, not depressed appearing, and not agitated.      Impression & Recommendations:  Problem # 1:  FATIGUE (ICD-780.79) situation depression likely with wofe's medical fraility and caring for her needs clinically, no depression requiring med tx at thsi time support offered - screening for poss underlying med illness with labs Orders: TLB-CBC Platelet - w/Differential (85025-CBCD) TLB-TSH (Thyroid Stimulating Hormone) (84443-TSH) TLB-Hepatic/Liver Function Pnl (80076-HEPATIC) TLB-BMP (Basic Metabolic Panel-BMET) (80048-METABOL)  Problem # 2:  DIABETES MELLITUS, TYPE II (ICD-250.00)  last a1c great - if still <6, will stop low dose metformin and try diet control His updated medication list for this problem includes:    Metformin Hcl 500 Mg Tabs (Metformin hcl) .Marland Kitchen... Take 1 by mouth once daily    Lotrel 10-20 Mg Caps (Amlodipine besy-benazepril hcl) .Marland Kitchen... Take 1 by mouth qd  Orders: TLB-BMP (Basic Metabolic Panel-BMET) (80048-METABOL) TLB-A1C / Hgb A1C (Glycohemoglobin) (83036-A1C)  Labs Reviewed: Creat: 1.4 (06/11/2009)     Last Eye Exam: normal (04/18/2009) Reviewed HgBA1c results: 5.6 (06/11/2009)  5.9 (01/07/2009)  Problem # 3:  HYPERTENSION (ICD-401.9)  elevated today but generally well controlled prev - asked pt to monitor BP at home and advise if running high - no med changes rec today His updated medication list for this problem includes:    Lotrel 10-20 Mg Caps (Amlodipine besy-benazepril hcl) .Marland Kitchen... Take 1 by mouth qd    Furosemide 40 Mg Tabs (Furosemide) .Marland Kitchen... Take 1 by mouth qd    Terazosin Hcl 10 Mg Caps (Terazosin hcl)  .Marland Kitchen... Take 1 by mouth once daily  BP today: 158/62 Prior BP: 120/62 (06/11/2009)  Labs Reviewed: K+: 4.0 (01/07/2009) Creat: : 1.4 (06/11/2009)   Chol: 147 (01/10/2008)   HDL: 54 (01/10/2008)   LDL: 77 (01/10/2008)   TG: 81 (01/10/2008)  Problem # 4:  HYPERLIPIDEMIA (ICD-272.4)  His updated medication list for this problem includes:    Simvastatin 40 Mg Tabs (Simvastatin) .Marland Kitchen... Take 1 by mouth qd  Orders: TLB-Lipid Panel (80061-LIPID)  Labs Reviewed: SGOT: 22 (01/10/2007)   SGPT: 18 (01/10/2007)   HDL:54 (01/10/2008), 50 (01/10/2007)  LDL:77 (01/10/2008), 88 (01/10/2007)  Chol:147 (01/10/2008), 162 (01/10/2007)  Trig:81 (01/10/2008), 119 (01/10/2007)  Complete Medication List: 1)  Metformin Hcl 500 Mg Tabs (Metformin hcl) .... Take 1 by mouth once daily 2)  Lotrel 10-20 Mg Caps (Amlodipine besy-benazepril hcl) .... Take 1 by mouth qd 3)  Furosemide 40 Mg Tabs (Furosemide) .... Take 1 by mouth qd 4)  Allopurinol 300 Mg Tabs (Allopurinol) .... Take 1 by mouth qd 5)  Simvastatin 40 Mg Tabs (Simvastatin) .... Take 1 by mouth qd 6)  Terazosin Hcl 10 Mg Caps (Terazosin hcl) .... Take 1 by mouth once daily 7)  Sb Naproxen Sodium 220 Mg Tabs (Naproxen sodium) .... Take 2 by mouth qd 8)  Zolpidem Tartrate 6.25 Mg Cr-tabs (Zolpidem tartrate) .Marland Kitchen.. 1 by mouth at bedtime as needed for sleep 9)  Multivitamins Tabs (Multiple vitamin) .... Once daily  Patient Instructions: 1)  it was good to see you today. 2)  test(s) ordered today - your results will be posted on the phone tree for review in 48-72 hours from the time of test completion; call [phone] and enter your 9 digit MRN (listed above on this page, just below your name); if any medicine changes need to be made or there are abnormal results, you will be contacted directly. 3)  no medication changes at this time - but will consider stopping metformin based on the lab results 4)  let us know if your fatigue and possible depression gets  any worse, please call for discussion on possible med treatment 5)  it is important that you work on losing weight, especially if we stop metformin - monitor your diet and consume fewer calories such as less carbohydrates (sugar) and less fat. you also need to increase your physical activity level - start by walking for 10-20 minutes 3 times per week and work up to 30 minutes 4-5 times each week.  6)  Please schedule a follow-up appointment in 3-4 months, sooner if problems.

## 2010-03-13 NOTE — Progress Notes (Signed)
Summary: Luis Glenn  Phone Note Refill Request Message from:  Fax from Pharmacy on January 06, 2010 11:10 AM  Refills Requested: Medication #1:  ZOLPIDEM TARTRATE 6.25 MG CR-TABS 1 by mouth at bedtime as needed for sleep # 30   Last Refilled: 12/02/2009 [phone] Last ov 10/23/09 Is this ok to refill?  Next Appointment Scheduled: 01/20/10 Initial call taken by: Orlan Leavens RMA,  January 06, 2010 11:11 AM  Follow-up for Phone Call        ok to fill as prev rx'd - thanks Follow-up by: Newt Lukes MD,  January 06, 2010 1:06 PM  Additional Follow-up for Phone Call Additional follow up Details #1::        [phone]. Updated EMR Additional Follow-up by: Orlan Leavens RMA,  January 06, 2010 1:11 PM    Prescriptions: ZOLPIDEM TARTRATE 6.25 MG CR-TABS (ZOLPIDEM TARTRATE) 1 by mouth at bedtime as needed for sleep  #30 x 5   Entered by:   Orlan Leavens RMA   Authorized by:   Newt Lukes MD   Signed by:   Orlan Leavens RMA on 01/06/2010   Method used:   Historical   RxID:   [id number]

## 2010-04-30 ENCOUNTER — Encounter: Payer: Self-pay | Admitting: *Deleted

## 2010-05-05 ENCOUNTER — Other Ambulatory Visit: Payer: Self-pay | Admitting: *Deleted

## 2010-05-05 ENCOUNTER — Telehealth: Payer: Self-pay | Admitting: *Deleted

## 2010-05-05 MED ORDER — SIMVASTATIN 40 MG PO TABS: 40.0000 mg | ORAL_TABLET | Freq: Every day | ORAL | Status: DC

## 2010-05-05 MED ORDER — TERAZOSIN HCL 10 MG PO CAPS: 10.0000 mg | ORAL_CAPSULE | Freq: Every day | ORAL | Status: DC

## 2010-05-05 NOTE — Telephone Encounter (Signed)
Faxed script to [phone] on his Simvastatin...05/05/10@1 :09pm/LMB

## 2010-05-05 NOTE — Telephone Encounter (Signed)
Sent 90 day to [facility]...05/05/10@10 :16am/LMB

## 2010-05-14 ENCOUNTER — Other Ambulatory Visit: Payer: Self-pay | Admitting: *Deleted

## 2010-05-14 MED ORDER — FUROSEMIDE 40 MG PO TABS: 40.0000 mg | ORAL_TABLET | Freq: Every day | ORAL | Status: DC

## 2010-05-14 MED ORDER — ALLOPURINOL 300 MG PO TABS: 300.0000 mg | ORAL_TABLET | Freq: Every day | ORAL | Status: DC

## 2010-05-14 NOTE — Telephone Encounter (Signed)
Sent refill to [facility]. EPIC updated..05/14/10@1 :19pm

## 2010-05-22 ENCOUNTER — Other Ambulatory Visit: Payer: Self-pay | Admitting: Internal Medicine

## 2010-06-06 ENCOUNTER — Encounter: Payer: Self-pay | Admitting: Internal Medicine

## 2010-06-06 ENCOUNTER — Ambulatory Visit (INDEPENDENT_AMBULATORY_CARE_PROVIDER_SITE_OTHER): Payer: Medicare Other | Admitting: Internal Medicine

## 2010-06-06 ENCOUNTER — Other Ambulatory Visit (INDEPENDENT_AMBULATORY_CARE_PROVIDER_SITE_OTHER): Payer: Medicare Other

## 2010-06-06 DIAGNOSIS — E119 Type 2 diabetes mellitus without complications: Secondary | ICD-10-CM

## 2010-06-06 DIAGNOSIS — F329 Major depressive disorder, single episode, unspecified: Secondary | ICD-10-CM

## 2010-06-06 DIAGNOSIS — F3289 Other specified depressive episodes: Secondary | ICD-10-CM

## 2010-06-06 DIAGNOSIS — I10 Essential (primary) hypertension: Secondary | ICD-10-CM

## 2010-06-06 DIAGNOSIS — E785 Hyperlipidemia, unspecified: Secondary | ICD-10-CM

## 2010-06-06 LAB — HEMOGLOBIN A1C: Hgb A1c MFr Bld: 5.5 % (ref 4.6–6.5)

## 2010-06-06 NOTE — Assessment & Plan Note (Signed)
Does not regularly check sugars - remains on low dose metformin  Check a1c today

## 2010-06-06 NOTE — Assessment & Plan Note (Signed)
Improved symptoms - ready to wean off wellbutrin - instructed on taper process Pt will call id recurrent depression or irritability symptoms

## 2010-06-06 NOTE — Patient Instructions (Signed)
It was good to see you today. Test(s) ordered today. Your results will be called to you after review (48-72hours after test completion). If any changes need to be made, you will be notified at that time. Taper off wellbutrin as discussed - call if recurrent depression symptoms  Medications reviewed, no other changes at this time.

## 2010-06-06 NOTE — Progress Notes (Signed)
  Subjective:    Patient ID: Luis Glenn, male    DOB: 02/19/1927, 77 y.o.   MRN: [id number]  HPI   here for followup - reviewed chronic medical issues:  depression - manifest as irritability and fatigue complicated by exhaustion with care for wife -  Remote tx zoloft not well tolerated started wellbutrin 01/2010 for same -  better sleep, no tearfulness, less overwhelming/unexplained sadness Ready to stop same due to constipation Se and feeling better  DM2 - advised to stop metformin 09/2009 due to well controlled a1c - but has continued on same with diet mgmt-  does not regularly check sugar at home - no symptoms hyper or hypo -glycemia -  HTN - reports compliance with ongoing medical treatment and no changes in medication dose or frequency. denies adverse side effects related to current therapy.  no CP, HA or vision changes - chronic swellling in ankles without recent changes-   dyslipidemia - reports compliance with ongoing medical treatment and no changes in medication dose or frequency. denies adverse side effects related to current therapy.  fasting today  Past Medical History  Diagnosis Date  . GOUT   . PERIPHERAL VASCULAR DISEASE   . DIVERTICULOSIS, COLON   . BENIGN PROSTATIC HYPERTROPHY   . DEPRESSION   . DIABETES MELLITUS, TYPE II   . GERD   . HYPERLIPIDEMIA   . HYPERTENSION   . CATARACTS   . OSTEOARTHRITIS   . ANXIETY   . ALLERGIC RHINITIS      Review of Systems  Constitutional: Negative for fever.  Respiratory: Negative for cough and shortness of breath.   Cardiovascular: Negative for chest pain and palpitations.  Neurological: Negative for syncope and headaches.       Objective:   Physical Exam  Constitutional: He is oriented to person, place, and time. He appears well-developed and well-nourished. No distress.  Cardiovascular: Normal rate, regular rhythm and normal heart sounds.   Pulmonary/Chest: Effort normal and breath sounds normal. No  respiratory distress. He has no wheezes.  Neurological: He is alert and oriented to person, place, and time. No cranial nerve deficit. Coordination normal.  Psychiatric: He has a normal mood and affect. His behavior is normal. Thought content normal.  BP 142/70  Pulse 95  Temp(Src) 97.9 F (36.6 C) (Oral)  Wt 234 lb 1.9 oz (106.196 kg)  SpO2 95% Lab Results  Component Value Date   WBC 8.2 09/17/2009   HGB 13.6 09/17/2009   HCT 40.1 09/17/2009   PLT 202.0 09/17/2009   CHOL 138 09/17/2009   TRIG 74.0 09/17/2009   HDL 47.80 09/17/2009   ALT 13 09/17/2009   AST 15 09/17/2009   NA 146* 09/17/2009   K 4.2 09/17/2009   CL 109 09/17/2009   CREATININE 1.3 09/17/2009   BUN 22 09/17/2009   CO2 27 09/17/2009   TSH 1.47 09/17/2009   HGBA1C 5.6 09/17/2009        Assessment & Plan:  See problem list. Medications and labs reviewed today.

## 2010-06-08 ENCOUNTER — Telehealth: Payer: Self-pay | Admitting: Internal Medicine

## 2010-06-08 NOTE — Telephone Encounter (Signed)
Lab Results  Component Value Date   HGBA1C 5.5 06/06/2010   Please call patient - normal or stable test results. No medication changes recommended. Thanks.

## 2010-06-08 NOTE — Assessment & Plan Note (Signed)
On statin - The current medical regimen is effective;  continue present plan and medications.  

## 2010-06-08 NOTE — Assessment & Plan Note (Signed)
The current medical regimen is effective;  continue present plan and medications. BP Readings from Last 3 Encounters:  06/06/10 142/70  03/04/10 148/70  01/21/10 130/60

## 2010-06-09 NOTE — Telephone Encounter (Signed)
Pt Notified with  Lab results.Marland KitchenMarland Kitchen4/30/12@12 :04pm/LMB

## 2010-06-27 NOTE — Procedures (Signed)
[facility]  Patient:    Luis Glenn, Luis Glenn                       MRN: [id number] Proc. Date: 06/10/00 Adm. Date:  [id number] Attending:  Sabino Gasser                           Procedure Report  PROCEDURE:  Colonoscopy.  INDICATION FOR PROCEDURE:  Rectal bleeding.  ANESTHESIA:  Demerol 120 mg, Versed 10 mg.  DESCRIPTION OF PROCEDURE:  With the patient mildly sedated in the left lateral decubitus position, the Olympus videoscopic colonoscope was inserted in the rectum and passed under direct vision to the hepatic flexure; however, once we got this distance we could not advance further without causing excessive discomfort of the patient and therefore it was elected to withdraw the colonoscope taking circumferential views of the colonic mucosa as best we could. The patients vital signs and pulse oximeter remained stable. The patient tolerated the procedure well without apparent complications.  FINDINGS:  Diverticulosis of sigmoid colon otherwise unremarkable examination.  PLAN:   Barium enema to complete this examination. DD:  06/10/00 TD:  06/10/00 Job: [id number]

## 2010-07-08 ENCOUNTER — Other Ambulatory Visit: Payer: Self-pay | Admitting: *Deleted

## 2010-07-08 MED ORDER — ZOLPIDEM TARTRATE ER 6.25 MG PO TBCR: 6.2500 mg | EXTENDED_RELEASE_TABLET | Freq: Every evening | ORAL | Status: DC | PRN

## 2010-07-08 NOTE — Telephone Encounter (Signed)
Faxed script back to [facility] @ [phone].Marland KitchenMarland Kitchen5/29/12@3 :49pm/LMB

## 2010-07-17 ENCOUNTER — Other Ambulatory Visit: Payer: Self-pay | Admitting: *Deleted

## 2010-07-17 MED ORDER — AMLODIPINE BESY-BENAZEPRIL HCL 10-20 MG PO CAPS: 1.0000 | ORAL_CAPSULE | Freq: Every day | ORAL | Status: DC

## 2010-07-17 NOTE — Telephone Encounter (Signed)
R'cd fax from [facility] for refill of Lotrel

## 2010-09-02 ENCOUNTER — Other Ambulatory Visit: Payer: Self-pay | Admitting: Internal Medicine

## 2010-09-02 ENCOUNTER — Other Ambulatory Visit: Payer: Self-pay | Admitting: *Deleted

## 2010-09-02 MED ORDER — ZOLPIDEM TARTRATE ER 6.25 MG PO TBCR: 6.2500 mg | EXTENDED_RELEASE_TABLET | Freq: Every evening | ORAL | Status: DC | PRN

## 2010-09-02 NOTE — Telephone Encounter (Signed)
Faxed script back to [facility] @ [phone],,,,09/02/10@1 :57pm/LMB

## 2010-09-03 ENCOUNTER — Encounter: Payer: Self-pay | Admitting: Internal Medicine

## 2010-09-03 ENCOUNTER — Ambulatory Visit (INDEPENDENT_AMBULATORY_CARE_PROVIDER_SITE_OTHER): Payer: Medicare Other | Admitting: Internal Medicine

## 2010-09-03 ENCOUNTER — Other Ambulatory Visit (INDEPENDENT_AMBULATORY_CARE_PROVIDER_SITE_OTHER): Payer: Medicare Other

## 2010-09-03 VITALS — BP 142/62 | HR 75 | Temp 98.3°F | Ht 74.0 in | Wt 237.0 lb

## 2010-09-03 DIAGNOSIS — E119 Type 2 diabetes mellitus without complications: Secondary | ICD-10-CM

## 2010-09-03 DIAGNOSIS — F3289 Other specified depressive episodes: Secondary | ICD-10-CM

## 2010-09-03 DIAGNOSIS — F329 Major depressive disorder, single episode, unspecified: Secondary | ICD-10-CM

## 2010-09-03 DIAGNOSIS — I10 Essential (primary) hypertension: Secondary | ICD-10-CM

## 2010-09-03 DIAGNOSIS — E785 Hyperlipidemia, unspecified: Secondary | ICD-10-CM

## 2010-09-03 LAB — HEMOGLOBIN A1C: Hgb A1c MFr Bld: 5.5 % (ref 4.6–6.5)

## 2010-09-03 NOTE — Patient Instructions (Addendum)
It was good to see you today. Test(s) ordered today. Your results will be called to you after review (48-72hours after test completion). If any changes need to be made, you will be notified at that time. Consider new mattress as discussed - call if worsening back arthritis or other pain symptoms  Medications reviewed, no other changes at this time. Please schedule followup in 4 months, call sooner if problems. Will check a1c for diabetes as well as cholesterol next visit

## 2010-09-03 NOTE — Assessment & Plan Note (Signed)
Does not regularly check sugars - remains on low dose metformin  Check a1c today

## 2010-09-03 NOTE — Progress Notes (Signed)
Subjective:    Patient ID: Luis Glenn, male    DOB: 23-Oct-1927, 77 y.o.   MRN: [id number]  HPI here for followup - reviewed chronic medical issues:  Depression hx - manifest as irritability and fatigue complicated by exhaustion with care for wife -  Remote tx zoloft not well tolerated; tx\ wellbutrin 01/2010-05/2010 for same -  better sleep, no tearfulness, less overwhelming/unexplained sadness D/c'd same due to constipation Se and feeling better - no recurrence of depression at this time off med  DM2 - advised to stop metformin 09/2009 due to well controlled a1c - but has continued on same with diet mgmt-  does not regularly check sugar at home - no symptoms hyper or hypo -glycemia -  HTN - reports compliance with ongoing medical treatment and no changes in medication dose or frequency. denies adverse side effects related to current therapy.  no CP, HA or vision changes - chronic swellling in ankles without recent changes-   dyslipidemia - on simva for years -reports compliance with ongoing medical treatment and no changes in medication dose or frequency. denies adverse side effects related to current therapy.     Past Medical History  Diagnosis Date  . GOUT   . PERIPHERAL VASCULAR DISEASE   . DIVERTICULOSIS, COLON   . BENIGN PROSTATIC HYPERTROPHY   . DEPRESSION   . DIABETES MELLITUS, TYPE II   . GERD   . HYPERLIPIDEMIA   . HYPERTENSION   . CATARACTS   . OSTEOARTHRITIS   . ANXIETY   . ALLERGIC RHINITIS      Review of Systems  Constitutional: Negative for fever.  Respiratory: Negative for cough and shortness of breath.   Cardiovascular: Negative for chest pain and palpitations.  Neurological: Negative for syncope and headaches.       Objective:   Physical Exam  BP 142/62  Pulse 75  Temp(Src) 98.3 F (36.8 C) (Oral)  Ht 6\' 2"  (1.88 m)  Wt 237 lb (107.502 kg)  BMI 30.43 kg/m2  SpO2 97% Wt Readings from Last 3 Encounters:  09/03/10 237 lb (107.502 kg)    06/06/10 234 lb 1.9 oz (106.196 kg)  03/04/10 239 lb 6.4 oz (108.591 kg)   Constitutional: overweight; oriented to person, place, and time. appears well-developed and well-nourished. No distress.  Wife at side Neck: Normal range of motion. Neck supple. No JVD present. No thyromegaly present.  Cardiovascular: Normal rate, regular rhythm and normal heart sounds.  No murmur heard. 1+ BLE edema, R>L (chronic) Pulmonary/Chest: Effort normal and breath sounds normal. No respiratory distress. no wheezes.  Neurological: he is alert and oriented x3. No cranial nerve deficit. Coordination and gait normal; MAE well, no dysarthria and language fluent Skin: Skin is warm and dry.  No erythema or ulceration. SKs present Psychiatric: he has a normal mood and affect. behavior is normal. Judgment and thought content normal.     Lab Results  Component Value Date   WBC 8.2 09/17/2009   HGB 13.6 09/17/2009   HCT 40.1 09/17/2009   PLT 202.0 09/17/2009   CHOL 138 09/17/2009   TRIG 74.0 09/17/2009   HDL 47.80 09/17/2009   ALT 13 09/17/2009   AST 15 09/17/2009   NA 146* 09/17/2009   K 4.2 09/17/2009   CL 109 09/17/2009   CREATININE 1.3 09/17/2009   BUN 22 09/17/2009   CO2 27 09/17/2009   TSH 1.47 09/17/2009   HGBA1C 5.5 06/06/2010        Assessment & Plan:  See  problem list. Medications and labs reviewed today.

## 2010-09-04 NOTE — Assessment & Plan Note (Signed)
Stable symptoms off wellbutrin - tx 01/2010-05/2010 with same Remote tx sertraline trial not well tol Pt will call if recurrent depression or irritability symptoms

## 2010-09-04 NOTE — Assessment & Plan Note (Signed)
On simvastatin - last lipids at goal The current medical regimen is effective;  continue present plan and medications. 

## 2010-09-04 NOTE — Assessment & Plan Note (Signed)
The current medical regimen is reasonably effective;  continue present plan and medications.  BP Readings from Last 3 Encounters:  09/03/10 142/62  06/06/10 142/70  03/04/10 148/70

## 2010-11-01 ENCOUNTER — Other Ambulatory Visit: Payer: Self-pay | Admitting: Internal Medicine

## 2010-11-03 ENCOUNTER — Other Ambulatory Visit: Payer: Self-pay | Admitting: *Deleted

## 2010-11-03 MED ORDER — ZOLPIDEM TARTRATE ER 6.25 MG PO TBCR: 6.2500 mg | EXTENDED_RELEASE_TABLET | Freq: Every evening | ORAL | Status: DC | PRN

## 2010-11-03 NOTE — Telephone Encounter (Signed)
Faxed script back to [facility] @ [phone].Marland KitchenMarland Kitchen9/24/12@1 :09pm/LMB

## 2010-11-11 ENCOUNTER — Other Ambulatory Visit: Payer: Self-pay | Admitting: Internal Medicine

## 2010-11-12 ENCOUNTER — Ambulatory Visit (INDEPENDENT_AMBULATORY_CARE_PROVIDER_SITE_OTHER): Payer: Medicare Other | Admitting: Internal Medicine

## 2010-11-12 ENCOUNTER — Encounter: Payer: Self-pay | Admitting: Internal Medicine

## 2010-11-12 VITALS — BP 152/60 | HR 79 | Temp 98.6°F | Wt 238.8 lb

## 2010-11-12 DIAGNOSIS — Z23 Encounter for immunization: Secondary | ICD-10-CM

## 2010-11-12 DIAGNOSIS — F329 Major depressive disorder, single episode, unspecified: Secondary | ICD-10-CM

## 2010-11-12 MED ORDER — SERTRALINE HCL 25 MG PO TABS: 25.0000 mg | ORAL_TABLET | Freq: Every day | ORAL | Status: DC

## 2010-11-12 NOTE — Assessment & Plan Note (Signed)
Does not regularly check sugars - remains on low dose metformin due to pt pref though suspect could be "diet controlled" Check a1c today Lab Results  Component Value Date   HGBA1C 5.5 09/03/2010    

## 2010-11-12 NOTE — Assessment & Plan Note (Signed)
On simvastatin - last lipids at goal The current medical regimen is effective;  continue present plan and medications. Check lipids now

## 2010-11-12 NOTE — Progress Notes (Signed)
Subjective:    Patient ID: Luis Glenn, male    DOB: [birth year], 77 y.o.   MRN: [id number]  HPI here for depression: Depression hx - manifest as irritability and fatigue complicated by exhaustion with care for wife -  Remote tx zoloft; tx wellbutrin 01/2010-05/2010 for same -  better sleep, no tearfulness, less overwhelming/unexplained sadness D/c'd wellbutrin due to constipation side effects and feeling better - Now recurrence of depression as noted by family (dtr who is nurse) -  "short fuse", tearful - no HI or SI  Also reviewed chronic medical issues: DM2 - advised to stop metformin 09/2009 due to well controlled a1c - but has continued on same with diet mgmt-  does not regularly check sugar at home - no symptoms hyper or hypo -glycemia -  HTN - reports compliance with ongoing medical treatment and no changes in medication dose or frequency. denies adverse side effects related to current therapy.  no CP, HA or vision changes - chronic swellling in ankles without recent changes-   dyslipidemia - on simva for years -reports compliance with ongoing medical treatment and no changes in medication dose or frequency. denies adverse side effects related to current therapy.     Past Medical History  Diagnosis Date  . GOUT   . PERIPHERAL VASCULAR DISEASE   . DIVERTICULOSIS, COLON   . BENIGN PROSTATIC HYPERTROPHY   . DEPRESSION   . DIABETES MELLITUS, TYPE II   . GERD   . HYPERLIPIDEMIA   . HYPERTENSION   . CATARACTS   . OSTEOARTHRITIS   . ANXIETY   . ALLERGIC RHINITIS      Review of Systems  Constitutional: Negative for fever.  Respiratory: Negative for cough and shortness of breath.   Cardiovascular: Negative for chest pain and palpitations.  Neurological: Negative for syncope and headaches.       Objective:   Physical Exam BP 152/60  Pulse 79  Temp(Src) 98.6 F (37 C) (Oral)  Wt 238 lb 12.8 oz (108.319 kg)  SpO2 93% Wt Readings from Last 3 Encounters:  11/12/10  238 lb 12.8 oz (108.319 kg)  09/03/10 237 lb (107.502 kg)  06/06/10 234 lb 1.9 oz (106.196 kg)   Constitutional: overweight; oriented to person, place, and time. appears well-developed and well-nourished. No distress.   Neck: Normal range of motion. Neck supple. No JVD present. No thyromegaly present.  Cardiovascular: Normal rate, regular rhythm and normal heart sounds.  No murmur heard. 1+ BLE edema, R>L (chronic) Pulmonary/Chest: Effort normal and breath sounds normal. No respiratory distress. no wheezes.  Neurological: he is alert and oriented x3. No cranial nerve deficit. Coordination and gait normal; MAE well, no dysarthria and language fluent Skin: Skin is warm and dry.  No erythema or ulceration. SKs present Psychiatric: he has depressed mood and affect. behavior is normal. Judgment and thought content normal.    Lab Results  Component Value Date   WBC 8.2 09/17/2009   HGB 13.6 09/17/2009   HCT 40.1 09/17/2009   PLT 202.0 09/17/2009   CHOL 138 09/17/2009   TRIG 74.0 09/17/2009   HDL 47.80 09/17/2009   ALT 13 09/17/2009   AST 15 09/17/2009   NA 146* 09/17/2009   K 4.2 09/17/2009   CL 109 09/17/2009   CREATININE 1.3 09/17/2009   BUN 22 09/17/2009   CO2 27 09/17/2009   TSH 1.47 09/17/2009   HGBA1C 5.5 09/03/2010        Assessment & Plan:  See problem list. Medications and  labs reviewed today.

## 2010-11-12 NOTE — Assessment & Plan Note (Signed)
Hx same wellbutrin - tx 01/2010-05/2010 with same Remote tx sertraline - would like to try again now - erx done Pt will call if worsening depression or irritability symptoms or new SI/SI follow up 4-6 wk

## 2010-11-12 NOTE — Assessment & Plan Note (Signed)
The current medical regimen is reasonably effective;  continue present plan and medications.  BP Readings from Last 3 Encounters:  11/12/10 152/60  09/03/10 142/62  06/06/10 142/70

## 2010-11-12 NOTE — Patient Instructions (Addendum)
It was good to see you today. Start sertraline for depression symptoms - Your prescription(s) have been submitted to your pharmacy. Please take as directed and contact our office if you believe you are having problem(s) with the medication(s). Call if symptoms worse after starting this medication as we discussed Keep appt Nov as sched - will check diabetes mellitus and cholesterol labs that visit

## 2010-12-22 ENCOUNTER — Other Ambulatory Visit (INDEPENDENT_AMBULATORY_CARE_PROVIDER_SITE_OTHER): Payer: Medicare Other

## 2010-12-22 ENCOUNTER — Ambulatory Visit (INDEPENDENT_AMBULATORY_CARE_PROVIDER_SITE_OTHER): Payer: Medicare Other | Admitting: Internal Medicine

## 2010-12-22 ENCOUNTER — Encounter: Payer: Self-pay | Admitting: Internal Medicine

## 2010-12-22 VITALS — BP 142/72 | HR 75 | Temp 97.7°F | Resp 16 | Wt 229.5 lb

## 2010-12-22 DIAGNOSIS — I10 Essential (primary) hypertension: Secondary | ICD-10-CM

## 2010-12-22 DIAGNOSIS — F329 Major depressive disorder, single episode, unspecified: Secondary | ICD-10-CM

## 2010-12-22 DIAGNOSIS — E785 Hyperlipidemia, unspecified: Secondary | ICD-10-CM

## 2010-12-22 DIAGNOSIS — E119 Type 2 diabetes mellitus without complications: Secondary | ICD-10-CM

## 2010-12-22 MED ORDER — SERTRALINE HCL 50 MG PO TABS: 50.0000 mg | ORAL_TABLET | Freq: Every day | ORAL | Status: DC

## 2010-12-22 NOTE — Assessment & Plan Note (Signed)
On simvastatin - last lipids at goal The current medical regimen is effective;  continue present plan and medications.

## 2010-12-22 NOTE — Assessment & Plan Note (Signed)
Hx same wellbutrin - tx 01/2010-05/2010 with same Remote tx sertraline - resumed same 11/2010 - increase dose now Pt will call if worsening depression or irritability symptoms or new SI/SI

## 2010-12-22 NOTE — Patient Instructions (Signed)
It was good to see you today. Increase dose sertraline for depression symptoms - Your prescription(s) have been submitted to your pharmacy. Please take as directed and contact our office if you believe you are having problem(s) with the medication(s). Test(s) ordered today. Your results will be called to you after review (48-72hours after test completion). If any changes need to be made, you will be notified at that time. Please schedule followup in 4-6 months for diabetes mellitus check, call sooner if problems.

## 2010-12-22 NOTE — Assessment & Plan Note (Signed)
The current medical regimen is reasonably effective;  continue present plan and medications.  BP Readings from Last 3 Encounters:  12/22/10 142/72  11/12/10 152/60  09/03/10 142/62

## 2010-12-22 NOTE — Assessment & Plan Note (Signed)
Does not regularly check sugars - remains on low dose metformin due to pt pref though suspect could be "diet controlled" Check a1c today Lab Results  Component Value Date   HGBA1C 5.5 09/03/2010

## 2010-12-22 NOTE — Progress Notes (Signed)
Subjective:    Patient ID: Luis Glenn, male    DOB: 1927-07-04, 77 y.o.   MRN: [id number]  HPI here for followup- reviewed chronic medical issues today:  depression: Depression hx - manifest as irritability and fatigue complicated by exhaustion with care for wife -  Remote tx zoloft; tx wellbutrin 01/2010-05/2010 for same -  better sleep, no tearfulness, less overwhelming/unexplained sadness D/c'd wellbutrin due to constipation side effects and feeling better - recurrence of depression 11/2010 as noted by family (dtr who is nurse) -  "short fuse", tearful - no HI or SI - resumed sertraline low dose 11/2010: symptoms   DM2 - advised to stop metformin 09/2009 due to well controlled a1c - but has continued on same with diet mgmt-  does not regularly check sugar at home - no symptoms hyper or hypo -glycemia -  HTN - reports compliance with ongoing medical treatment and no changes in medication dose or frequency. denies adverse side effects related to current therapy.  no CP, HA or vision changes - chronic swellling in ankles without recent changes-   dyslipidemia - on simva for years -reports compliance with ongoing medical treatment and no changes in medication dose or frequency. denies adverse side effects related to current therapy.     Past Medical History  Diagnosis Date  . GOUT   . PERIPHERAL VASCULAR DISEASE   . DIVERTICULOSIS, COLON   . BENIGN PROSTATIC HYPERTROPHY   . DEPRESSION   . DIABETES MELLITUS, TYPE II   . GERD   . HYPERLIPIDEMIA   . HYPERTENSION   . CATARACTS   . OSTEOARTHRITIS   . ANXIETY   . ALLERGIC RHINITIS      Review of Systems  Constitutional: Negative for fever.  Respiratory: Negative for cough and shortness of breath.   Cardiovascular: Negative for chest pain and palpitations.  Neurological: Negative for syncope and headaches.       Objective:   Physical Exam BP 142/72  Pulse 75  Temp(Src) 97.7 F (36.5 C) (Oral)  Resp 16  Wt 229 lb 8  oz (104.101 kg)  SpO2 98% Wt Readings from Last 3 Encounters:  12/22/10 229 lb 8 oz (104.101 kg)  11/12/10 238 lb 12.8 oz (108.319 kg)  09/03/10 237 lb (107.502 kg)   Constitutional: overweight; appears well-developed and well-nourished. No distress.  Spouse at side Neck: Normal range of motion. Neck supple. No JVD present. No thyromegaly present.  Cardiovascular: Normal rate, regular rhythm and normal heart sounds.  No murmur heard. 1+ BLE edema, R>L (chronic) Pulmonary/Chest: Effort normal and breath sounds normal. No respiratory distress. no wheezes.  Neurological: he is alert and oriented x3. No cranial nerve deficit. Coordination and gait normal; MAE well, no dysarthria and language fluent Skin: Skin is warm and dry.  No erythema or ulceration. SKs present Psychiatric: he has minimally depressed mood and affect. behavior is normal. Judgment and thought content normal.    Lab Results  Component Value Date   WBC 8.2 09/17/2009   HGB 13.6 09/17/2009   HCT 40.1 09/17/2009   PLT 202.0 09/17/2009   CHOL 138 09/17/2009   TRIG 74.0 09/17/2009   HDL 47.80 09/17/2009   ALT 13 09/17/2009   AST 15 09/17/2009   NA 146* 09/17/2009   K 4.2 09/17/2009   CL 109 09/17/2009   CREATININE 1.3 09/17/2009   BUN 22 09/17/2009   CO2 27 09/17/2009   TSH 1.47 09/17/2009   HGBA1C 5.5 09/03/2010  Assessment & Plan:  See problem list. Medications and labs reviewed today.

## 2010-12-23 LAB — LIPID PANEL
Cholesterol: 127 mg/dL (ref 0–200)
HDL: 61.4 mg/dL (ref >39.00)
LDL Cholesterol: 48 mg/dL (ref 0–99)
Triglycerides: 86 mg/dL (ref 0.0–149.0)
VLDL: 17.2 mg/dL (ref 0.0–40.0)

## 2010-12-23 LAB — HEMOGLOBIN A1C: Hgb A1c MFr Bld: 5.3 % (ref 4.6–6.5)

## 2011-01-05 ENCOUNTER — Other Ambulatory Visit: Payer: Self-pay | Admitting: *Deleted

## 2011-01-05 MED ORDER — ZOLPIDEM TARTRATE ER 6.25 MG PO TBCR: 6.2500 mg | EXTENDED_RELEASE_TABLET | Freq: Every evening | ORAL | Status: DC | PRN

## 2011-01-05 NOTE — Telephone Encounter (Signed)
Faxed script back to [phone]...01/05/11@4 :47pm/LMB

## 2011-01-12 ENCOUNTER — Other Ambulatory Visit: Payer: Self-pay | Admitting: Internal Medicine

## 2011-01-29 ENCOUNTER — Other Ambulatory Visit: Payer: Self-pay | Admitting: *Deleted

## 2011-01-29 MED ORDER — ZOLPIDEM TARTRATE ER 6.25 MG PO TBCR: 6.2500 mg | EXTENDED_RELEASE_TABLET | Freq: Every evening | ORAL | Status: DC | PRN

## 2011-01-29 NOTE — Telephone Encounter (Signed)
[facility]....01/29/11@2 :56pm/LMB

## 2011-03-02 ENCOUNTER — Other Ambulatory Visit: Payer: Self-pay | Admitting: Internal Medicine

## 2011-04-01 ENCOUNTER — Other Ambulatory Visit: Payer: Self-pay | Admitting: *Deleted

## 2011-04-01 MED ORDER — ZOLPIDEM TARTRATE ER 6.25 MG PO TBCR: 6.2500 mg | EXTENDED_RELEASE_TABLET | Freq: Every evening | ORAL | Status: DC | PRN

## 2011-04-01 NOTE — Telephone Encounter (Signed)
[facility]... 04/01/11@1 :34pm/LMB

## 2011-04-17 ENCOUNTER — Encounter: Payer: Self-pay | Admitting: Internal Medicine

## 2011-04-17 ENCOUNTER — Ambulatory Visit (INDEPENDENT_AMBULATORY_CARE_PROVIDER_SITE_OTHER)
Admission: RE | Admit: 2011-04-17 | Discharge: 2011-04-17 | Disposition: A | Discharge status: Home / Self Care | Payer: Medicare Other | Source: Ambulatory Visit | Attending: Internal Medicine | Admitting: Internal Medicine

## 2011-04-17 ENCOUNTER — Ambulatory Visit (INDEPENDENT_AMBULATORY_CARE_PROVIDER_SITE_OTHER): Payer: Medicare Other | Admitting: Internal Medicine

## 2011-04-17 VITALS — BP 144/72 | HR 80 | Temp 97.0°F | Wt 230.6 lb

## 2011-04-17 DIAGNOSIS — M545 Low back pain, unspecified: Secondary | ICD-10-CM

## 2011-04-17 DIAGNOSIS — M47817 Spondylosis without myelopathy or radiculopathy, lumbosacral region: Secondary | ICD-10-CM | POA: Diagnosis not present

## 2011-04-17 DIAGNOSIS — M5137 Other intervertebral disc degeneration, lumbosacral region: Secondary | ICD-10-CM

## 2011-04-17 DIAGNOSIS — M5136 Other intervertebral disc degeneration, lumbar region: Secondary | ICD-10-CM

## 2011-04-17 DIAGNOSIS — M51379 Other intervertebral disc degeneration, lumbosacral region without mention of lumbar back pain or lower extremity pain: Secondary | ICD-10-CM

## 2011-04-17 DIAGNOSIS — M4000 Postural kyphosis, site unspecified: Secondary | ICD-10-CM | POA: Diagnosis not present

## 2011-04-17 MED ORDER — BACLOFEN 10 MG PO TABS: 5.0000 mg | ORAL_TABLET | Freq: Three times a day (TID) | ORAL | Status: AC | PRN

## 2011-04-17 MED ORDER — PREDNISONE (PAK) 10 MG PO TABS: 10.0000 mg | ORAL_TABLET | ORAL | Status: AC

## 2011-04-17 NOTE — Progress Notes (Signed)
Subjective:    Patient ID: Luis Glenn, male    DOB: [birth year], 78 y.o.   MRN: [id number]  Back Pain This is a recurrent problem. The current episode started yesterday. The problem occurs constantly. The problem has been gradually improving since onset. The pain is present in the lumbar spine. The quality of the pain is described as aching. The pain does not radiate. The pain is moderate. The pain is worse during the day. The symptoms are aggravated by bending, standing, twisting and sitting. Stiffness is present all day. Pertinent negatives include no abdominal pain, bladder incontinence, bowel incontinence, chest pain, dysuria, fever, headaches, leg pain, numbness, pelvic pain, tingling, weakness or weight loss. Risk factors include sedentary lifestyle. He has tried NSAIDs, muscle relaxant and heat for the symptoms. The treatment provided moderate relief.    Past Medical History  Diagnosis Date  . GOUT   . PERIPHERAL VASCULAR DISEASE   . DIVERTICULOSIS, COLON   . BENIGN PROSTATIC HYPERTROPHY   . DEPRESSION   . DIABETES MELLITUS, TYPE II   . GERD   . HYPERLIPIDEMIA   . HYPERTENSION   . CATARACTS   . OSTEOARTHRITIS   . ANXIETY   . ALLERGIC RHINITIS     Review of Systems  Constitutional: Negative for fever and weight loss.  Cardiovascular: Negative for chest pain.  Gastrointestinal: Negative for abdominal pain and bowel incontinence.  Genitourinary: Negative for bladder incontinence, dysuria and pelvic pain.  Musculoskeletal: Positive for back pain.  Neurological: Negative for tingling, weakness, numbness and headaches.       Objective:   Physical Exam BP 144/72  Pulse 80  Temp(Src) 97 F (36.1 C) (Oral)  Wt 230 lb 9.6 oz (104.599 kg)  SpO2 95% Wt Readings from Last 3 Encounters:  04/17/11 230 lb 9.6 oz (104.599 kg)  12/22/10 229 lb 8 oz (104.101 kg)  11/12/10 238 lb 12.8 oz (108.319 kg)   Constitutional:  He appears well-developed and well-nourished. No distress.    Neck: Normal range of motion. Neck supple. No JVD present. No thyromegaly present.  Cardiovascular: Normal rate, regular rhythm and normal heart sounds.  No murmur heard. no BLE edema Pulmonary/Chest: Effort normal and breath sounds normal. No respiratory distress. no wheezes.  Musculoskeletal: Back: full range of motion of thoracic and lumbar spine. Non tender to palpation. Negative straight leg raise. DTR's are symmetrically intact. Sensation intact in all dermatomes of the lower extremities. Full strength to manual muscle testing. patient is able to heel toe walk without difficulty and ambulates with antalgic gait. Skin: Skin is warm and dry.  No erythema or ulceration.  Psychiatric: he has a normal mood and affect. behavior is normal. Judgment and thought content normal.   Lab Results  Component Value Date   WBC 8.2 09/17/2009   HGB 13.6 09/17/2009   HCT 40.1 09/17/2009   PLT 202.0 09/17/2009   GLUCOSE 107* 09/17/2009   CHOL 127 12/22/2010   TRIG 86.0 12/22/2010   HDL 61.40 12/22/2010   LDLCALC 48 12/22/2010   ALT 13 09/17/2009   AST 15 09/17/2009   NA 146* 09/17/2009   K 4.2 09/17/2009   CL 109 09/17/2009   CREATININE 1.3 09/17/2009   BUN 22 09/17/2009   CO2 27 09/17/2009   TSH 1.47 09/17/2009   HGBA1C 5.3 12/22/2010      Assessment & Plan:  low back pain - suspect lumbar DDD with acute exac/muscle strain No radiculopathy or deficit - Slightly improved in last 24h with conservative  self care (NSAIDs) Check xray rule out lytic lesion or slip tx with pred pak x 6d and muscle relaxer as needed

## 2011-04-17 NOTE — Patient Instructions (Signed)
It was good to see you today. Test(s) ordered today. Your results will be called to you after review (48-72hours after test completion). If any changes need to be made, you will be notified at that time. Use prednisone taper x 6 days and baclofen muscle relaxer at night as needed for spasm - Your prescription(s) have been submitted to your pharmacy. Please take as directed and contact our office if you believe you are having problem(s) with the medication(s). Back Pain, Adult Back pain is very common. The pain often gets better over time. The cause of back pain is usually not dangerous. Most people can learn to manage their back pain on their own.   HOME CARE    Stay active. Start with short walks on flat ground if you can. Try to walk farther each day.   Do not sit, drive, or stand in one place for more than 30 minutes. Do not stay in bed.   Do not avoid exercise or work. Activity can help your back heal faster.   Be careful when you bend or lift an object. Bend at your knees, keep the object close to you, and do not twist.   Sleep on a firm mattress. Lie on your side, and bend your knees. If you lie on your back, put a pillow under your knees.   Only take medicines as told by your doctor.   Put ice on the injured area.   Put ice in a plastic bag.   Place a towel between your skin and the bag.   Leave the ice on for 15 to 20 minutes, 3 to 4 times a day for the first 2 to 3 days. After that, you can switch between ice and heat packs.   Ask your doctor about back exercises or massage.   Avoid feeling anxious or stressed. Find good ways to deal with stress, such as exercise.  GET HELP RIGHT AWAY IF:    Your pain does not go away with rest or medicine.   Your pain does not go away in 1 week.   You have new problems.   You do not feel well.   The pain spreads into your legs.   You cannot control when you poop (bowel movement) or pee (urinate).   Your arms or legs feel weak or  lose feeling (numbness).   You feel sick to your stomach (nauseous) or throw up (vomit).   You have belly (abdominal) pain.   You feel like you may pass out (faint).  MAKE SURE YOU:    Understand these instructions.   Will watch your condition.   Will get help right away if you are not doing well or get worse.  Document Released: 07/15/2007 Document Revised: 01/15/2011 Document Reviewed: 06/16/2010 [facility] Patient Information 2012 [place], [place].

## 2011-04-28 ENCOUNTER — Other Ambulatory Visit: Payer: Self-pay | Admitting: Internal Medicine

## 2011-05-08 ENCOUNTER — Other Ambulatory Visit: Payer: Self-pay | Admitting: Internal Medicine

## 2011-06-02 ENCOUNTER — Other Ambulatory Visit: Payer: Self-pay | Admitting: *Deleted

## 2011-06-03 MED ORDER — ZOLPIDEM TARTRATE ER 6.25 MG PO TBCR: 6.2500 mg | EXTENDED_RELEASE_TABLET | Freq: Every evening | ORAL | Status: DC | PRN

## 2011-06-03 NOTE — Telephone Encounter (Signed)
[facility] spoke [facility]/pharmacist ok # [phone] 1with 1 additional..Marland Kitchen4/24/13@10 :29am/LMB

## 2011-06-24 ENCOUNTER — Encounter: Payer: Self-pay | Admitting: Internal Medicine

## 2011-06-24 ENCOUNTER — Other Ambulatory Visit (INDEPENDENT_AMBULATORY_CARE_PROVIDER_SITE_OTHER): Payer: Medicare Other

## 2011-06-24 ENCOUNTER — Ambulatory Visit (INDEPENDENT_AMBULATORY_CARE_PROVIDER_SITE_OTHER): Payer: Medicare Other | Admitting: Internal Medicine

## 2011-06-24 VITALS — BP 148/60 | HR 76 | Temp 97.7°F | Ht 74.0 in | Wt 215.0 lb

## 2011-06-24 DIAGNOSIS — E119 Type 2 diabetes mellitus without complications: Secondary | ICD-10-CM

## 2011-06-24 DIAGNOSIS — K589 Irritable bowel syndrome without diarrhea: Secondary | ICD-10-CM | POA: Diagnosis not present

## 2011-06-24 DIAGNOSIS — N400 Enlarged prostate without lower urinary tract symptoms: Secondary | ICD-10-CM

## 2011-06-24 DIAGNOSIS — I10 Essential (primary) hypertension: Secondary | ICD-10-CM | POA: Diagnosis not present

## 2011-06-24 MED ORDER — TAMSULOSIN HCL 0.4 MG PO CAPS: 0.4000 mg | ORAL_CAPSULE | Freq: Every day | ORAL | Status: DC

## 2011-06-24 NOTE — Assessment & Plan Note (Signed)
Add flomax for symptoms

## 2011-06-24 NOTE — Assessment & Plan Note (Signed)
Does not regularly check sugars - remains on low dose metformin due to pt pref though suspect could be "diet controlled" Check a1c today Lab Results  Component Value Date   HGBA1C 5.3 12/22/2010

## 2011-06-24 NOTE — Assessment & Plan Note (Signed)
The current medical regimen is reasonably effective;  continue present plan and medications.  BP Readings from Last 3 Encounters:  06/24/11 148/60  04/17/11 144/72  12/22/10 142/72

## 2011-06-24 NOTE — Assessment & Plan Note (Signed)
Chronic symptoms - worse with stress Ok to try probiotics - pt to call if worse of unimproved

## 2011-06-24 NOTE — Progress Notes (Signed)
Subjective:    Patient ID: Luis Glenn, male    DOB: April 30, 1927, 78 y.o.   MRN: [id number]  HPI here for followup- reviewed chronic medical issues today:  BPH - increase hesitation, no dysuria or blood, no incontinence or pain  depression hx - manifest as irritability and fatigue complicated by exhaustion with care for wife -  Remote tx zoloft; tx wellbutrin 01/2010-05/2010 for same -  better sleep, no tearfulness, less overwhelming/unexplained sadness D/c'd wellbutrin due to constipation side effects and feeling better - recurrence of depression 11/2010 as noted by family (dtr who is nurse) -  "short fuse", tearful - no HI or SI - resumed sertraline low dose 11/2010: symptoms improved  DM2 - advised to stop metformin 09/2009 due to well controlled a1c - but has continued on same with diet mgmt-  does not regularly check sugar at home - no symptoms hyper or hypo -glycemia -  HTN - reports compliance with ongoing medical treatment and no changes in medication dose or frequency. denies adverse side effects related to current therapy.  no chest pain, headache or vision changes - chronic swellling in ankles without recent changes-   dyslipidemia - on simva for years -reports compliance with ongoing medical treatment and no changes in medication dose or frequency. denies adverse side effects related to current therapy.     Past Medical History  Diagnosis Date  . GOUT   . PERIPHERAL VASCULAR DISEASE   . DIVERTICULOSIS, COLON   . BENIGN PROSTATIC HYPERTROPHY   . DEPRESSION   . DIABETES MELLITUS, TYPE II   . GERD   . HYPERLIPIDEMIA   . HYPERTENSION   . CATARACTS   . OSTEOARTHRITIS   . ANXIETY   . ALLERGIC RHINITIS      Review of Systems  Constitutional: Negative for fever.  Respiratory: Negative for cough and shortness of breath.   Cardiovascular: Negative for chest pain and palpitations.  Neurological: Negative for syncope and headaches.       Objective:   Physical  Exam BP 148/60  Pulse 76  Temp(Src) 97.7 F (36.5 C) (Oral)  Ht 6\' 2"  (1.88 m)  Wt 215 lb (97.523 kg)  BMI 27.60 kg/m2  SpO2 97% Wt Readings from Last 3 Encounters:  06/24/11 215 lb (97.523 kg)  04/17/11 230 lb 9.6 oz (104.599 kg)  12/22/10 229 lb 8 oz (104.101 kg)   Constitutional: overweight; appears well-developed and well-nourished. No distress.  Spouse at side Neck: Normal range of motion. Neck supple. No JVD present. No thyromegaly present.  Cardiovascular: Normal rate, regular rhythm and normal heart sounds.  No murmur heard. 1+ BLE edema, R>L (chronic) Pulmonary/Chest: Effort normal and breath sounds normal. No respiratory distress. no wheezes. Skin: Skin is warm and dry.  No erythema or ulceration. SKs present Psychiatric: he has dysphoric mood and affect. behavior is normal. Judgment and thought content normal.    Lab Results  Component Value Date   WBC 8.2 09/17/2009   HGB 13.6 09/17/2009   HCT 40.1 09/17/2009   PLT 202.0 09/17/2009   CHOL 127 12/22/2010   TRIG 86.0 12/22/2010   HDL 61.40 12/22/2010   ALT 13 09/17/2009   AST 15 09/17/2009   NA 146* 09/17/2009   K 4.2 09/17/2009   CL 109 09/17/2009   CREATININE 1.3 09/17/2009   BUN 22 09/17/2009   CO2 27 09/17/2009   TSH 1.47 09/17/2009   HGBA1C 5.3 12/22/2010        Assessment & Plan:  See  problem list. Medications and labs reviewed today.

## 2011-06-24 NOTE — Patient Instructions (Signed)
It was good to see you today. Test(s) ordered today. Your results will be called to you after review (48-72hours after test completion). If any changes need to be made, you will be notified at that time. Ok to try probiotic such as Libr[employer], academic (any brand ok) Also start flomax for prostate - Your prescription(s) have been submitted to your pharmacy. Please take as directed and contact our office if you believe you are having problem(s) with the medication(s). Other Medications reviewed, no changes at this time.  Please schedule followup in 3-4 months, call sooner if problems.

## 2011-06-30 DIAGNOSIS — I739 Peripheral vascular disease, unspecified: Secondary | ICD-10-CM | POA: Diagnosis not present

## 2011-06-30 DIAGNOSIS — D2370 Other benign neoplasm of skin of unspecified lower limb, including hip: Secondary | ICD-10-CM | POA: Diagnosis not present

## 2011-06-30 DIAGNOSIS — E1159 Type 2 diabetes mellitus with other circulatory complications: Secondary | ICD-10-CM | POA: Diagnosis not present

## 2011-06-30 DIAGNOSIS — L608 Other nail disorders: Secondary | ICD-10-CM | POA: Diagnosis not present

## 2011-07-14 ENCOUNTER — Other Ambulatory Visit: Payer: Self-pay | Admitting: Internal Medicine

## 2011-07-14 NOTE — Telephone Encounter (Signed)
Done erx 

## 2011-07-31 ENCOUNTER — Other Ambulatory Visit: Payer: Self-pay | Admitting: Internal Medicine

## 2011-08-07 ENCOUNTER — Other Ambulatory Visit: Payer: Self-pay | Admitting: *Deleted

## 2011-08-07 MED ORDER — ZOLPIDEM TARTRATE ER 6.25 MG PO TBCR: 6.2500 mg | EXTENDED_RELEASE_TABLET | Freq: Every evening | ORAL | Status: DC | PRN

## 2011-08-07 NOTE — Telephone Encounter (Signed)
Rx faxed to pharmacy  

## 2011-09-07 ENCOUNTER — Other Ambulatory Visit: Payer: Self-pay | Admitting: Internal Medicine

## 2011-09-23 ENCOUNTER — Ambulatory Visit (INDEPENDENT_AMBULATORY_CARE_PROVIDER_SITE_OTHER): Payer: Medicare Other | Admitting: Internal Medicine

## 2011-09-23 ENCOUNTER — Encounter: Payer: Self-pay | Admitting: Internal Medicine

## 2011-09-23 ENCOUNTER — Other Ambulatory Visit (INDEPENDENT_AMBULATORY_CARE_PROVIDER_SITE_OTHER): Payer: Medicare Other

## 2011-09-23 VITALS — BP 142/60 | HR 64 | Temp 97.1°F | Ht 74.0 in | Wt 215.0 lb

## 2011-09-23 DIAGNOSIS — E119 Type 2 diabetes mellitus without complications: Secondary | ICD-10-CM

## 2011-09-23 DIAGNOSIS — R0789 Other chest pain: Secondary | ICD-10-CM | POA: Diagnosis not present

## 2011-09-23 DIAGNOSIS — I10 Essential (primary) hypertension: Secondary | ICD-10-CM | POA: Diagnosis not present

## 2011-09-23 DIAGNOSIS — I4891 Unspecified atrial fibrillation: Secondary | ICD-10-CM

## 2011-09-23 DIAGNOSIS — E785 Hyperlipidemia, unspecified: Secondary | ICD-10-CM | POA: Diagnosis not present

## 2011-09-23 LAB — BASIC METABOLIC PANEL
CO2: 24 mEq/L (ref 19–32)
Calcium: 9.5 mg/dL (ref 8.4–10.5)
Chloride: 106 mEq/L (ref 96–112)
Creatinine, Ser: 1.2 mg/dL (ref 0.4–1.5)
Sodium: 138 mEq/L (ref 135–145)

## 2011-09-23 LAB — CBC WITH DIFFERENTIAL/PLATELET
Basophils Relative: 0.3 % (ref 0.0–3.0)
Eosinophils Relative: 3 % (ref 0.0–5.0)
Hemoglobin: 12.9 g/dL — ABNORMAL LOW (ref 13.0–17.0)
Lymphocytes Relative: 22.2 % (ref 12.0–46.0)
MCHC: 33.1 g/dL (ref 30.0–36.0)
Neutro Abs: 4.6 10*3/uL (ref 1.4–7.7)
Neutrophils Relative %: 66 % (ref 43.0–77.0)
RBC: 4.24 Mil/uL (ref 4.22–5.81)
WBC: 7 10*3/uL (ref 4.5–10.5)

## 2011-09-23 LAB — HEPATIC FUNCTION PANEL
ALT: 18 U/L (ref 0–53)
AST: 25 U/L (ref 0–37)
Bilirubin, Direct: 0.2 mg/dL (ref 0.0–0.3)
Total Bilirubin: 0.7 mg/dL (ref 0.3–1.2)
Total Protein: 6.7 g/dL (ref 6.0–8.3)

## 2011-09-23 LAB — LIPID PANEL: Cholesterol: 129 mg/dL (ref 0–200)

## 2011-09-23 NOTE — Patient Instructions (Signed)
It was good to see you today. Test(s) ordered today. Your results will be called to you after review (48-72hours after test completion). If any changes need to be made, you will be notified at that time. we'll make referral for nuclear stress test. Our office will contact you regarding appointment(s) once made. Medications reviewed, no changes at this time. Please schedule followup in 4-6 months, call sooner if problems.

## 2011-09-23 NOTE — Assessment & Plan Note (Signed)
On simvastatin - last lipids at goal The current medical regimen is effective;  continue present plan and medications. 

## 2011-09-23 NOTE — Assessment & Plan Note (Signed)
Does not regularly check sugars - remains on low dose metformin due to pt pref though suspect could be "diet controlled" Check a1c today Lab Results  Component Value Date   HGBA1C 5.3 06/24/2011

## 2011-09-23 NOTE — Assessment & Plan Note (Signed)
The current medical regimen is reasonably effective;  continue present plan and medications.  BP Readings from Last 3 Encounters:  09/23/11 142/60  06/24/11 148/60  04/17/11 144/72

## 2011-09-23 NOTE — Progress Notes (Signed)
Subjective:    Patient ID: Luis Glenn, male    DOB: 06/05/1927, 78 y.o.   MRN: [id number]  HPI here for followup- reviewed chronic medical issues today:  BPH - increase hesitation, no dysuria or blood, no incontinence or pain  depression - manifest as irritability and fatigue complicated by exhaustion with care for wife -  Remote tx zoloft and tx wellbutrin 01/2010-05/2010 for same -  better sleep, no tearfulness, less overwhelming/unexplained sadness D/c'd wellbutrin due to constipation side effects and feeling better - recurrence of depression 11/2010 as noted by family (dtr who is nurse) -  "short fuse", tearful - no HI or SI - resumed sertraline low dose 11/2010: symptoms improved/stable  DM2 - advised to stop metformin 09/2009 due to well controlled a1c - but has continued on same with diet mgmt-  does not regularly check sugar at home - no symptoms hyper or hypo -glycemia -  HTN - reports compliance with ongoing medical treatment and no changes in medication dose or frequency. denies adverse side effects related to current therapy.  no chest pain, headache or vision changes - chronic swellling in ankles without recent changes-   dyslipidemia - on simva for years -reports compliance with ongoing medical treatment and no changes in medication dose or frequency. denies adverse side effects related to current therapy.     Past Medical History  Diagnosis Date  . GOUT   . PERIPHERAL VASCULAR DISEASE   . DIVERTICULOSIS, COLON   . BENIGN PROSTATIC HYPERTROPHY   . DEPRESSION   . DIABETES MELLITUS, TYPE II   . GERD   . HYPERLIPIDEMIA   . HYPERTENSION   . CATARACTS   . OSTEOARTHRITIS   . ANXIETY   . ALLERGIC RHINITIS      Review of Systems  Constitutional: Negative for fever.  Respiratory: Negative for cough and shortness of breath.   Cardiovascular: Positive for chest pain (R ant chest and shoulder, none at this time but sharp and exertional, lasted 1 month before spont  resolution). Negative for palpitations.  Neurological: Negative for syncope and headaches.       Objective:   Physical Exam BP 142/60  Pulse 64  Temp 97.1 F (36.2 C) (Oral)  Ht 6\' 2"  (1.88 m)  Wt 215 lb (97.523 kg)  BMI 27.60 kg/m2  SpO2 98% Wt Readings from Last 3 Encounters:  09/23/11 215 lb (97.523 kg)  06/24/11 215 lb (97.523 kg)  04/17/11 230 lb 9.6 oz (104.599 kg)   Constitutional: overweight; appears well-developed and well-nourished. No distress.  Neck: Normal range of motion. Neck supple. No JVD present. No thyromegaly present.  Cardiovascular: Normal rate, irregular rhythm and normal heart sounds.  No murmur heard. 1+ BLE edema, R>L (chronic) Pulmonary/Chest: Effort normal and breath sounds normal. No respiratory distress. no wheezes. MSkel - R shoulder with FROM, good strength to manual testing -  Skin: Skin is warm and dry.  No erythema or ulceration. SKs present Psychiatric: he has dysphoric mood and affect. behavior is normal. Judgment and thought content normal.    Lab Results  Component Value Date   WBC 8.2 09/17/2009   HGB 13.6 09/17/2009   HCT 40.1 09/17/2009   PLT 202.0 09/17/2009   CHOL 127 12/22/2010   TRIG 86.0 12/22/2010   HDL 61.40 12/22/2010   ALT 13 09/17/2009   AST 15 09/17/2009   NA 146* 09/17/2009   K 4.2 09/17/2009   CL 109 09/17/2009   CREATININE 1.3 09/17/2009   BUN 22  09/17/2009   CO2 27 09/17/2009   TSH 1.47 09/17/2009   HGBA1C 5.3 06/24/2011        Assessment & Plan:  See problem list. Medications and labs reviewed today.  chest pain,  atypocal - R side but nuerous CRF - ECG now>> new A fib (pt reports not new though i have no prior comparison on this EMR) Refer for stress test, non treadmill due to osteoarthritis  Check labs

## 2011-09-24 DIAGNOSIS — I4891 Unspecified atrial fibrillation: Secondary | ICD-10-CM | POA: Insufficient documentation

## 2011-09-28 ENCOUNTER — Telehealth: Payer: Self-pay | Admitting: *Deleted

## 2011-09-28 NOTE — Telephone Encounter (Signed)
Patient canceled myoview schedule for 09/30/11 @ 8:15. Per patient (has a scheduling conflict, patient cannot come in for morning appointments. Patient did not reschedule at that time. Dr. Felicity Coyer has been notified.

## 2011-09-30 ENCOUNTER — Encounter (HOSPITAL_COMMUNITY): Payer: Medicare Other

## 2011-10-07 ENCOUNTER — Other Ambulatory Visit: Payer: Self-pay | Admitting: *Deleted

## 2011-10-07 MED ORDER — ZOLPIDEM TARTRATE ER 6.25 MG PO TBCR: 6.2500 mg | EXTENDED_RELEASE_TABLET | Freq: Every evening | ORAL | Status: DC | PRN

## 2011-10-07 NOTE — Telephone Encounter (Signed)
[facility].../LMB 

## 2011-10-26 ENCOUNTER — Other Ambulatory Visit: Payer: Self-pay | Admitting: Internal Medicine

## 2011-11-02 ENCOUNTER — Ambulatory Visit (INDEPENDENT_AMBULATORY_CARE_PROVIDER_SITE_OTHER): Payer: Medicare Other | Admitting: General Practice

## 2011-11-02 DIAGNOSIS — Z23 Encounter for immunization: Secondary | ICD-10-CM | POA: Diagnosis not present

## 2011-11-06 ENCOUNTER — Other Ambulatory Visit: Payer: Self-pay | Admitting: Internal Medicine

## 2011-11-19 ENCOUNTER — Other Ambulatory Visit: Payer: Self-pay | Admitting: Internal Medicine

## 2011-12-07 ENCOUNTER — Other Ambulatory Visit: Payer: Self-pay | Admitting: *Deleted

## 2011-12-07 MED ORDER — ZOLPIDEM TARTRATE ER 6.25 MG PO TBCR: 6.2500 mg | EXTENDED_RELEASE_TABLET | Freq: Every evening | ORAL | Status: DC | PRN

## 2011-12-07 NOTE — Telephone Encounter (Signed)
Request for zolpidem er 6.25mg . Last filled 11/06/11. Is this ok?...Raechel Chute

## 2011-12-07 NOTE — Telephone Encounter (Signed)
[facility].../lmb 

## 2011-12-10 DIAGNOSIS — L84 Corns and callosities: Secondary | ICD-10-CM | POA: Diagnosis not present

## 2011-12-10 DIAGNOSIS — E1159 Type 2 diabetes mellitus with other circulatory complications: Secondary | ICD-10-CM | POA: Diagnosis not present

## 2011-12-10 DIAGNOSIS — I739 Peripheral vascular disease, unspecified: Secondary | ICD-10-CM | POA: Diagnosis not present

## 2011-12-10 DIAGNOSIS — L608 Other nail disorders: Secondary | ICD-10-CM | POA: Diagnosis not present

## 2012-01-22 ENCOUNTER — Other Ambulatory Visit: Payer: Self-pay | Admitting: Internal Medicine

## 2012-02-08 ENCOUNTER — Other Ambulatory Visit: Payer: Self-pay | Admitting: *Deleted

## 2012-02-08 MED ORDER — ZOLPIDEM TARTRATE ER 6.25 MG PO TBCR: 6.2500 mg | EXTENDED_RELEASE_TABLET | Freq: Every evening | ORAL | Status: DC | PRN

## 2012-02-08 NOTE — Telephone Encounter (Signed)
R'cd fax from [facility] for refill of Zolpidem-last written 12/07/2011 #30 with 1 refill.-Please advise in VAL's absence.

## 2012-02-15 ENCOUNTER — Other Ambulatory Visit: Payer: Self-pay | Admitting: Internal Medicine

## 2012-02-18 DIAGNOSIS — L84 Corns and callosities: Secondary | ICD-10-CM | POA: Diagnosis not present

## 2012-02-18 DIAGNOSIS — I739 Peripheral vascular disease, unspecified: Secondary | ICD-10-CM | POA: Diagnosis not present

## 2012-02-18 DIAGNOSIS — L608 Other nail disorders: Secondary | ICD-10-CM | POA: Diagnosis not present

## 2012-02-18 DIAGNOSIS — E1159 Type 2 diabetes mellitus with other circulatory complications: Secondary | ICD-10-CM | POA: Diagnosis not present

## 2012-03-07 ENCOUNTER — Other Ambulatory Visit: Payer: Self-pay | Admitting: Internal Medicine

## 2012-03-07 ENCOUNTER — Telehealth: Payer: Self-pay | Admitting: *Deleted

## 2012-03-07 NOTE — Telephone Encounter (Signed)
I am not his doctor 

## 2012-03-07 NOTE — Telephone Encounter (Signed)
Called [facility] and spoke to [place] told her Dr. Yetta Barre is not pt's doctor denied refill request. Okey Regal verbalized understanding.

## 2012-03-07 NOTE — Telephone Encounter (Signed)
Received fax from [facility] requesting refill for Zolpidem ER 6.25 mg tab. Please advise

## 2012-03-09 ENCOUNTER — Other Ambulatory Visit: Payer: Self-pay | Admitting: *Deleted

## 2012-03-09 MED ORDER — ZOLPIDEM TARTRATE ER 6.25 MG PO TBCR: 6.2500 mg | EXTENDED_RELEASE_TABLET | Freq: Every evening | ORAL | Status: DC | PRN

## 2012-03-09 NOTE — Telephone Encounter (Signed)
[facility].../lmb 

## 2012-03-23 ENCOUNTER — Ambulatory Visit: Payer: Medicare Other | Admitting: Internal Medicine

## 2012-03-30 ENCOUNTER — Ambulatory Visit (INDEPENDENT_AMBULATORY_CARE_PROVIDER_SITE_OTHER): Payer: Medicare Other | Admitting: Internal Medicine

## 2012-03-30 ENCOUNTER — Other Ambulatory Visit (INDEPENDENT_AMBULATORY_CARE_PROVIDER_SITE_OTHER): Payer: Medicare Other

## 2012-03-30 ENCOUNTER — Encounter: Payer: Self-pay | Admitting: Internal Medicine

## 2012-03-30 VITALS — BP 140/78 | HR 82 | Temp 97.0°F | Wt 217.8 lb

## 2012-03-30 DIAGNOSIS — E119 Type 2 diabetes mellitus without complications: Secondary | ICD-10-CM

## 2012-03-30 DIAGNOSIS — I10 Essential (primary) hypertension: Secondary | ICD-10-CM | POA: Diagnosis not present

## 2012-03-30 DIAGNOSIS — F3289 Other specified depressive episodes: Secondary | ICD-10-CM

## 2012-03-30 DIAGNOSIS — F329 Major depressive disorder, single episode, unspecified: Secondary | ICD-10-CM | POA: Diagnosis not present

## 2012-03-30 LAB — HEMOGLOBIN A1C: Hgb A1c MFr Bld: 5 % (ref 4.6–6.5)

## 2012-03-30 MED ORDER — SERTRALINE HCL 100 MG PO TABS: 100.0000 mg | ORAL_TABLET | Freq: Every day | ORAL | Status: DC

## 2012-03-30 NOTE — Assessment & Plan Note (Signed)
Hx same wellbutrin - tx 01/2010-05/2010 with same Remote tx sertraline - resumed same 11/2010 - last increase dose 12/2010 Max dose now due to increasing fatigue and apathy symptoms  Verified no SI/HI Pt agrees to call if worsening depression or irritability symptoms

## 2012-03-30 NOTE — Patient Instructions (Signed)
It was good to see you today. We have reviewed your prior records including labs and tests today Medications reviewed, increase dose sertraline to 100mg  daily for depression - no other changes at this time. Your prescription(s) have been submitted to your pharmacy. Please take as directed and contact our office if you believe you are having problem(s) with the medication(s). Test(s) ordered today. Your results will be released to MyChart (or called to you) after review, usually within 72hours after test completion. If any changes need to be made, you will be notified at that same time. Please schedule followup in 6 months, call sooner if problems.

## 2012-03-30 NOTE — Assessment & Plan Note (Signed)
The current medical regimen is reasonably effective;  continue present plan and medications.  BP Readings from Last 3 Encounters:  03/30/12 152/68  09/23/11 142/60  06/24/11 148/60

## 2012-03-30 NOTE — Assessment & Plan Note (Signed)
Does not regularly check sugars - remains on low dose metformin due to pt pref though suspect could be "diet controlled" On ACEI and statin Check a1c today Lab Results  Component Value Date   HGBA1C 5.1 09/23/2011

## 2012-03-30 NOTE — Progress Notes (Signed)
  Subjective:    Patient ID: Luis Glenn, male    DOB: 02-02-1928, 78 y.o.   MRN: [id number]  HPI here for followup- reviewed chronic medical issues today:   Past Medical History  Diagnosis Date  . GOUT   . PERIPHERAL VASCULAR DISEASE   . DIVERTICULOSIS, COLON   . BENIGN PROSTATIC HYPERTROPHY   . DEPRESSION   . DIABETES MELLITUS, TYPE II   . GERD   . HYPERLIPIDEMIA   . HYPERTENSION   . CATARACTS   . OSTEOARTHRITIS   . ANXIETY   . ALLERGIC RHINITIS      Review of Systems  Constitutional: Positive for fatigue. Negative for fever and unexpected weight change.  Respiratory: Negative for cough and shortness of breath.   Cardiovascular: Negative for chest pain and palpitations.  Neurological: Negative for syncope and headaches.       Objective:   Physical Exam BP 140/78  Pulse 82  Temp(Src) 97 F (36.1 C) (Oral)  Wt 217 lb 12.8 oz (98.793 kg)  BMI 27.95 kg/m2  SpO2 99% Wt Readings from Last 3 Encounters:  03/30/12 217 lb 12.8 oz (98.793 kg)  09/23/11 215 lb (97.523 kg)  06/24/11 215 lb (97.523 kg)   Constitutional: overweight; appears well-developed and well-nourished. No distress.  Neck: Normal range of motion. Neck supple. No JVD present. No thyromegaly present.  Cardiovascular: Normal rate, irregular rhythm and normal heart sounds.  No murmur heard. 1+ BLE edema, R>L (chronic) Pulmonary/Chest: Effort normal and breath sounds normal. No respiratory distress. no wheezes. MSkel - R shoulder with FROM, good strength to manual testing -  Skin: Skin is warm and dry.  No erythema or ulceration. SKs present Psychiatric: he has dysphoric mood and affect. behavior is normal. Judgment and thought content normal.    Lab Results  Component Value Date   WBC 7.0 09/23/2011   HGB 12.9* 09/23/2011   HCT 38.9* 09/23/2011   PLT 206.0 09/23/2011   CHOL 129 09/23/2011   TRIG 96.0 09/23/2011   HDL 57.20 09/23/2011   ALT 18 09/23/2011   AST 25 09/23/2011   NA 138 09/23/2011   K 4.0  09/23/2011   CL 106 09/23/2011   CREATININE 1.2 09/23/2011   BUN 31* 09/23/2011   CO2 24 09/23/2011   TSH 1.47 09/17/2009   HGBA1C 5.1 09/23/2011        Assessment & Plan:  See problem list. Medications and labs reviewed today.

## 2012-05-02 DIAGNOSIS — E1159 Type 2 diabetes mellitus with other circulatory complications: Secondary | ICD-10-CM | POA: Diagnosis not present

## 2012-05-02 DIAGNOSIS — L84 Corns and callosities: Secondary | ICD-10-CM | POA: Diagnosis not present

## 2012-05-02 DIAGNOSIS — I739 Peripheral vascular disease, unspecified: Secondary | ICD-10-CM | POA: Diagnosis not present

## 2012-05-02 DIAGNOSIS — L608 Other nail disorders: Secondary | ICD-10-CM | POA: Diagnosis not present

## 2012-07-05 ENCOUNTER — Other Ambulatory Visit: Payer: Self-pay | Admitting: Internal Medicine

## 2012-07-22 ENCOUNTER — Other Ambulatory Visit: Payer: Self-pay | Admitting: Internal Medicine

## 2012-07-25 DIAGNOSIS — I739 Peripheral vascular disease, unspecified: Secondary | ICD-10-CM | POA: Diagnosis not present

## 2012-07-25 DIAGNOSIS — L84 Corns and callosities: Secondary | ICD-10-CM | POA: Diagnosis not present

## 2012-07-25 DIAGNOSIS — E1159 Type 2 diabetes mellitus with other circulatory complications: Secondary | ICD-10-CM | POA: Diagnosis not present

## 2012-07-25 DIAGNOSIS — L608 Other nail disorders: Secondary | ICD-10-CM | POA: Diagnosis not present

## 2012-07-27 ENCOUNTER — Other Ambulatory Visit: Payer: Self-pay | Admitting: Internal Medicine

## 2012-08-04 ENCOUNTER — Other Ambulatory Visit: Payer: Self-pay | Admitting: Internal Medicine

## 2012-08-04 ENCOUNTER — Other Ambulatory Visit: Payer: Self-pay | Admitting: *Deleted

## 2012-08-04 NOTE — Telephone Encounter (Signed)
Received PA for Tamsulosin. Form has been completed and fax back to insurance...lmb

## 2012-08-05 NOTE — Telephone Encounter (Signed)
Received Pa back this am it states drug is a covered benefit does not need a PA. [facility]...lmb

## 2012-08-15 ENCOUNTER — Other Ambulatory Visit: Payer: Self-pay | Admitting: Internal Medicine

## 2012-09-04 ENCOUNTER — Other Ambulatory Visit: Payer: Self-pay | Admitting: Internal Medicine

## 2012-09-28 ENCOUNTER — Encounter: Payer: Self-pay | Admitting: Internal Medicine

## 2012-09-28 ENCOUNTER — Ambulatory Visit (INDEPENDENT_AMBULATORY_CARE_PROVIDER_SITE_OTHER): Payer: Medicare Other | Admitting: Internal Medicine

## 2012-09-28 ENCOUNTER — Other Ambulatory Visit (INDEPENDENT_AMBULATORY_CARE_PROVIDER_SITE_OTHER): Payer: Medicare Other

## 2012-09-28 VITALS — BP 160/72 | HR 65 | Temp 97.9°F | Wt 209.1 lb

## 2012-09-28 DIAGNOSIS — E119 Type 2 diabetes mellitus without complications: Secondary | ICD-10-CM | POA: Diagnosis not present

## 2012-09-28 DIAGNOSIS — E785 Hyperlipidemia, unspecified: Secondary | ICD-10-CM | POA: Diagnosis not present

## 2012-09-28 DIAGNOSIS — I10 Essential (primary) hypertension: Secondary | ICD-10-CM

## 2012-09-28 DIAGNOSIS — F329 Major depressive disorder, single episode, unspecified: Secondary | ICD-10-CM

## 2012-09-28 LAB — HEMOGLOBIN A1C: Hgb A1c MFr Bld: 5.2 % (ref 4.6–6.5)

## 2012-09-28 LAB — BASIC METABOLIC PANEL
CO2: 26 mEq/L (ref 19–32)
Chloride: 108 mEq/L (ref 96–112)
Glucose, Bld: 98 mg/dL (ref 70–99)
Potassium: 4.2 mEq/L (ref 3.5–5.1)
Sodium: 140 mEq/L (ref 135–145)

## 2012-09-28 LAB — LIPID PANEL: Total CHOL/HDL Ratio: 2

## 2012-09-28 MED ORDER — BUPROPION HCL ER (XL) 150 MG PO TB24: 150.0000 mg | ORAL_TABLET | Freq: Every day | ORAL | Status: DC

## 2012-09-28 MED ORDER — FLUTICASONE PROPIONATE 50 MCG/ACT NA SUSP: 2.0000 | Freq: Every day | NASAL | Status: DC

## 2012-09-28 NOTE — Assessment & Plan Note (Signed)
Chronic, exacerbated by declining health of spouse with dementia and caregiver status Hx same wellbutrin - tx 01/2010-05/2010 with same Remote tx sertraline - resumed same 11/2010 - last increase dose 12/2010 Increased to max dose 03/2012 due to increasing fatigue and apathy symptoms  Add Wellbutrin to SSRI ongoing - ex done Verified no plans for SI/HI, but pt does admit to fleeting thoughts of "not being here" -  Discussed same openly, he agrees to call if increasing ideation or plan develops - support provided Pt agrees to call if worsening depression or irritability symptoms

## 2012-09-28 NOTE — Progress Notes (Signed)
  Subjective:    Patient ID: Luis Glenn, male    DOB: February 17, 1927, 79 y.o.   MRN: [id number]  HPI here for followup- reviewed chronic medical issues today:   Past Medical History  Diagnosis Date  . GOUT   . PERIPHERAL VASCULAR DISEASE   . DIVERTICULOSIS, COLON   . BENIGN PROSTATIC HYPERTROPHY   . DEPRESSION   . DIABETES MELLITUS, TYPE II   . GERD   . HYPERLIPIDEMIA   . HYPERTENSION   . CATARACTS   . OSTEOARTHRITIS   . ANXIETY   . ALLERGIC RHINITIS      Review of Systems  Constitutional: Positive for fatigue. Negative for fever and unexpected weight change.  Respiratory: Negative for cough and shortness of breath.   Cardiovascular: Negative for chest pain and palpitations.  Neurological: Negative for syncope and headaches.       Objective:   Physical Exam BP 160/72  Pulse 65  Temp(Src) 97.9 F (36.6 C) (Oral)  Wt 209 lb 1.9 oz (94.856 kg)  BMI 26.84 kg/m2  SpO2 98% Wt Readings from Last 3 Encounters:  09/28/12 209 lb 1.9 oz (94.856 kg)  03/30/12 217 lb 12.8 oz (98.793 kg)  09/23/11 215 lb (97.523 kg)   Constitutional: overweight; appears well-developed and well-nourished. No distress.  Neck: Normal range of motion. Neck supple. No JVD present. No thyromegaly present.  Cardiovascular: Normal rate, irregular rhythm and normal heart sounds.  No murmur heard. 1+ BLE edema, R>L (chronic) Pulmonary/Chest: Effort normal and breath sounds normal. No respiratory distress. no wheezes. Skin: Skin is warm and dry.  No erythema or ulceration. SKs present Psychiatric: he has dysphoric mood and affect. behavior is normal. Judgment and thought content normal.    Lab Results  Component Value Date   WBC 7.0 09/23/2011   HGB 12.9* 09/23/2011   HCT 38.9* 09/23/2011   PLT 206.0 09/23/2011   CHOL 129 09/23/2011   TRIG 96.0 09/23/2011   HDL 57.20 09/23/2011   ALT 18 09/23/2011   AST 25 09/23/2011   NA 138 09/23/2011   K 4.0 09/23/2011   CL 106 09/23/2011   CREATININE 1.2 09/23/2011    BUN 31* 09/23/2011   CO2 24 09/23/2011   TSH 1.47 09/17/2009   HGBA1C 5.0 03/30/2012        Assessment & Plan:  See problem list. Medications and labs reviewed today.

## 2012-09-28 NOTE — Assessment & Plan Note (Signed)
On simvastatin - last lipids at goal, check annually The current medical regimen is effective;  continue present plan and medications. 

## 2012-09-28 NOTE — Progress Notes (Signed)
Subjective:    Patient ID: Luis Glenn, male    DOB: 11-12-1927, 79 y.o.   MRN: [id number]  HPI    Presents today for follow up of chronic medical conditions. He has also been having problems with his nasal congestion at night.  Nasal congestion-  disrupting his sleep. He has tried nasal strips with variable success.  Depression- continues to experience fatigue and low mood. Currently on sertraline.   Diabetes Mellitus II- Well controlled diet and on low dose metformin. Last A1C was 5 in 03/2012. Denies symptoms or problems with med.   HTN- reasonable control on medications. No symptoms or issues with meds.  Hyperlipidemia- Controlled on Zocor. Last lipid check 2013.  BPH- Still experiencing urinary symptoms on terazosin and flomax.   Past Medical History  Diagnosis Date  . GOUT   . PERIPHERAL VASCULAR DISEASE   . DIVERTICULOSIS, COLON   . BENIGN PROSTATIC HYPERTROPHY   . DEPRESSION   . DIABETES MELLITUS, TYPE II   . GERD   . HYPERLIPIDEMIA   . HYPERTENSION   . CATARACTS   . OSTEOARTHRITIS   . ANXIETY   . ALLERGIC RHINITIS    Family History  Problem Relation Age of Onset  . Diabetes Mother   . Hypertension Mother   . Diabetes Father   . Hypertension Father   . Alcohol abuse Other   . Breast cancer Other    History  Substance Use Topics  . Smoking status: Former Games developer  . Smokeless tobacco: Not on file     Comment: Married, lives with wife. Retired  . Alcohol Use: Yes     Comment: occassional   Review of Systems  Constitutional:       Weight loss of 8 lbs since 03/2012, reports eating less  HENT: Positive for rhinorrhea.   Respiratory: Negative for cough and shortness of breath.   Cardiovascular: Negative for chest pain and palpitations.       Chronic leg swelling  Gastrointestinal:       Bowel irregularities- consistent with hx IBS  Musculoskeletal:       Joint pain in knees R>L-consistent with OA  Skin: Negative for rash.  Neurological: Negative for  dizziness and syncope.       Objective:   Physical Exam  Constitutional: He is oriented to person, place, and time. He appears well-developed and well-nourished.  HENT:  Head: Normocephalic and atraumatic.  Eyes: Conjunctivae and EOM are normal.  Neck: Normal range of motion. Neck supple. No thyromegaly present.  Cardiovascular: Normal rate and regular rhythm.   Pulmonary/Chest: Breath sounds normal. He has no wheezes. He has no rales.  Lymphadenopathy:    He has no cervical adenopathy.  Neurological: He is alert and oriented to person, place, and time.  Skin: Skin is warm and dry.  Psychiatric:  Dysphoric mood    Lab Results  Component Value Date   WBC 7.0 09/23/2011   HGB 12.9* 09/23/2011   HCT 38.9* 09/23/2011   PLT 206.0 09/23/2011   GLUCOSE 93 09/23/2011   CHOL 129 09/23/2011   TRIG 96.0 09/23/2011   HDL 57.20 09/23/2011   LDLCALC 53 09/23/2011   ALT 18 09/23/2011   AST 25 09/23/2011   NA 138 09/23/2011   K 4.0 09/23/2011   CL 106 09/23/2011   CREATININE 1.2 09/23/2011   BUN 31* 09/23/2011   CO2 24 09/23/2011   TSH 1.47 09/17/2009   HGBA1C 5.0 03/30/2012        Assessment &  Plan:  1. Nasal congestion- Likely due to allergic rhinitis. Antihistamine would cause worsening of BPH. Rx flonase.  2. Depression- Currently reports suicidal ideation but denies HI. Patient denies intent and plan, stating he must be around for his wife. Agrees to call if ideation becomes worse or he has intent. Today, will add wellbutrin to current treatment. Check screening labs today, considering weight loss.  3. DM II- Has been under excellent control, so will check A1C and d/c metformin.   4. Htn- moderately controlled. Patient agrees to check BP at home.   5. Hyperlipidemia- check lipid panel today, continue statin  6. BPH- currently at max therapy. Declines referral to urology.  Luis Glenn, Student-PA  See problem list. Medications and labs reviewed today. I have personally reviewed  this case with PA student. I also personally examined this patient. I agree with history and findings as documented above. I reviewed, discussed and approve of the assessment and plan as listed above. Rene Paci, MD

## 2012-09-28 NOTE — Assessment & Plan Note (Addendum)
Does not regularly check sugars -  stop metformin now and watch for "diet controlled" On ACEI and statin Check a1c today Lab Results  Component Value Date   HGBA1C 5.0 03/30/2012

## 2012-09-28 NOTE — Assessment & Plan Note (Signed)
The current medical regimen is reasonably effective;  continue present plan and medications.  BP Readings from Last 3 Encounters:  09/28/12 160/72  03/30/12 140/78  09/23/11 142/60

## 2012-09-28 NOTE — Patient Instructions (Addendum)
It was good to see you today. We have reviewed your prior records including labs and tests today Stop metformin resume Wellbutrin 150mg  daily with ongoing sertraline for depression -  Start flonase for allergic rhinitis symptoms -no other changes at this time. Your prescription(s) have been submitted to your pharmacy. Please take as directed and contact our office if you believe you are having problem(s) with the medication(s). Test(s) ordered today. Your results will be released to MyChart (or called to you) after review, usually within 72hours after test completion. If any changes need to be made, you will be notified at that same time. Please schedule followup in 6 months, call sooner if problems.

## 2012-10-25 ENCOUNTER — Other Ambulatory Visit: Payer: Self-pay | Admitting: Internal Medicine

## 2012-11-13 ENCOUNTER — Other Ambulatory Visit: Payer: Self-pay | Admitting: Internal Medicine

## 2012-11-16 ENCOUNTER — Ambulatory Visit: Payer: Medicare Other | Admitting: *Deleted

## 2012-11-18 DIAGNOSIS — L84 Corns and callosities: Secondary | ICD-10-CM | POA: Diagnosis not present

## 2012-11-18 DIAGNOSIS — L608 Other nail disorders: Secondary | ICD-10-CM | POA: Diagnosis not present

## 2012-11-18 DIAGNOSIS — E1159 Type 2 diabetes mellitus with other circulatory complications: Secondary | ICD-10-CM | POA: Diagnosis not present

## 2012-11-18 DIAGNOSIS — I739 Peripheral vascular disease, unspecified: Secondary | ICD-10-CM | POA: Diagnosis not present

## 2012-12-15 ENCOUNTER — Other Ambulatory Visit: Payer: Self-pay

## 2012-12-30 ENCOUNTER — Other Ambulatory Visit (INDEPENDENT_AMBULATORY_CARE_PROVIDER_SITE_OTHER): Payer: Medicare Other

## 2012-12-30 ENCOUNTER — Encounter: Payer: Self-pay | Admitting: Internal Medicine

## 2012-12-30 ENCOUNTER — Ambulatory Visit (INDEPENDENT_AMBULATORY_CARE_PROVIDER_SITE_OTHER)
Admission: RE | Admit: 2012-12-30 | Discharge: 2012-12-30 | Disposition: A | Discharge status: Home / Self Care | Payer: Medicare Other | Source: Ambulatory Visit | Attending: Internal Medicine | Admitting: Internal Medicine

## 2012-12-30 ENCOUNTER — Ambulatory Visit (INDEPENDENT_AMBULATORY_CARE_PROVIDER_SITE_OTHER): Payer: Medicare Other | Admitting: Internal Medicine

## 2012-12-30 VITALS — BP 158/68 | HR 81 | Temp 97.2°F | Wt 224.6 lb

## 2012-12-30 DIAGNOSIS — R0609 Other forms of dyspnea: Secondary | ICD-10-CM

## 2012-12-30 DIAGNOSIS — F329 Major depressive disorder, single episode, unspecified: Secondary | ICD-10-CM | POA: Diagnosis not present

## 2012-12-30 DIAGNOSIS — I509 Heart failure, unspecified: Secondary | ICD-10-CM | POA: Diagnosis not present

## 2012-12-30 DIAGNOSIS — M25561 Pain in right knee: Secondary | ICD-10-CM

## 2012-12-30 DIAGNOSIS — M25569 Pain in unspecified knee: Secondary | ICD-10-CM

## 2012-12-30 DIAGNOSIS — J90 Pleural effusion, not elsewhere classified: Secondary | ICD-10-CM | POA: Diagnosis not present

## 2012-12-30 LAB — CBC WITH DIFFERENTIAL/PLATELET
Basophils Absolute: 0 10*3/uL (ref 0.0–0.1)
Basophils Relative: 0.3 % (ref 0.0–3.0)
Eosinophils Relative: 3.4 % (ref 0.0–5.0)
HCT: 36.5 % — ABNORMAL LOW (ref 39.0–52.0)
Hemoglobin: 12.1 g/dL — ABNORMAL LOW (ref 13.0–17.0)
Lymphs Abs: 0.8 10*3/uL (ref 0.7–4.0)
MCHC: 33.2 g/dL (ref 30.0–36.0)
MCV: 87.1 fl (ref 78.0–100.0)
Monocytes Absolute: 0.5 10*3/uL (ref 0.1–1.0)
Monocytes Relative: 6.5 % (ref 3.0–12.0)
Neutrophils Relative %: 78.5 % — ABNORMAL HIGH (ref 43.0–77.0)
Platelets: 251 10*3/uL (ref 150.0–400.0)
RBC: 4.19 Mil/uL — ABNORMAL LOW (ref 4.22–5.81)
WBC: 7 10*3/uL (ref 4.5–10.5)

## 2012-12-30 LAB — BASIC METABOLIC PANEL
BUN: 21 mg/dL (ref 6–23)
CO2: 26 mEq/L (ref 19–32)
Calcium: 9.3 mg/dL (ref 8.4–10.5)
GFR: 47.44 mL/min — ABNORMAL LOW (ref >60.00)
Glucose, Bld: 107 mg/dL — ABNORMAL HIGH (ref 70–99)
Potassium: 4.1 mEq/L (ref 3.5–5.1)
Sodium: 141 mEq/L (ref 135–145)

## 2012-12-30 LAB — HEPATIC FUNCTION PANEL
AST: 24 U/L (ref 0–37)
Albumin: 3.8 g/dL (ref 3.5–5.2)

## 2012-12-30 LAB — BRAIN NATRIURETIC PEPTIDE: Pro B Natriuretic peptide (BNP): 446 pg/mL — ABNORMAL HIGH (ref 0.0–100.0)

## 2012-12-30 MED ORDER — FUROSEMIDE 40 MG PO TABS: 40.0000 mg | ORAL_TABLET | Freq: Two times a day (BID) | ORAL | Status: DC

## 2012-12-30 MED ORDER — FLUTICASONE PROPIONATE 50 MCG/ACT NA SUSP: 2.0000 | Freq: Every day | NASAL | Status: DC

## 2012-12-30 MED ORDER — POTASSIUM CHLORIDE CRYS ER 20 MEQ PO TBCR: 20.0000 meq | EXTENDED_RELEASE_TABLET | Freq: Every day | ORAL | Status: DC

## 2012-12-30 MED ORDER — ALPRAZOLAM 0.5 MG PO TABS: 0.5000 mg | ORAL_TABLET | Freq: Three times a day (TID) | ORAL | Status: DC | PRN

## 2012-12-30 MED ORDER — BUPROPION HCL 75 MG PO TABS: 75.0000 mg | ORAL_TABLET | Freq: Every day | ORAL | Status: DC

## 2012-12-30 NOTE — Progress Notes (Signed)
Pre-visit discussion using our clinic review tool. No additional management support is needed unless otherwise documented below in the visit note.  

## 2012-12-30 NOTE — Assessment & Plan Note (Signed)
Chronic, exacerbated by declining health of spouse with dementia and caregiver status Hx same wellbutrin - tx 01/2010-05/2010 with same Remote tx sertraline - resumed same 11/2010 - last increase dose 12/2010 Increased to max dose 03/2012 due to increasing fatigue and apathy symptoms  Add Wellbutrin to SSRI 09/2012, but unimproved so will DC same Use low dose xanax as needed for anxiety/irritability symptoms Verified no plans for SI/HI, but pt does admit to fleeting thoughts of "not being here" -  Discussed same openly, he agrees to call if increasing ideation or plan develops - support provided Pt agrees to call if worsening depression or irritability symptoms

## 2012-12-30 NOTE — Patient Instructions (Addendum)
It was good to see you today.  We have reviewed your prior records including labs and tests today  Test(s) ordered today -labs and chest xray. Your results will be released to MyChart (or called to you) after review, usually within 72hours after test completion. If any changes need to be made, you will be notified at that same time.  Medications reviewed and updated:  increase lasix to 40mg  2x/day Start potassium once daily while on lasix 2x/day Decrease wellbutrin to 75mg  daily x 2 weeks, then every other day for 2 weeks then stop Xanax as needed for irritablity symptoms  Your prescription(s) have been submitted to your pharmacy. Please take as directed and contact our office if you believe you are having problem(s) with the medication(s).  we'll make referral to ortho for your knee. Our office will contact you regarding appointment(s) once made.  Please schedule followup in 2 weeks, call sooner if problems. Heart Failure Heart failure means your heart has trouble pumping blood. This makes it hard for your body to work well. Heart failure is usually a long-term (chronic) condition. You must take good care of yourself and follow your doctor's treatment plan. HOME CARE  Take your heart medicine as told by your doctor.  Do not stop taking medicine unless your doctor tells you to.  Do not skip any dose of medicine.  Refill your medicines before they run out.  Take other medicines only as told by your doctor or pharmacist.  Stay active if told by your doctor. The elderly and people with severe heart failure should talk with a doctor about physical activity.  Eat heart healthy foods. Choose foods that are without trans fat and are low in saturated fat, cholesterol, and salt (sodium). This includes fresh or frozen fruits and vegetables, fish, lean meats, fat-free or low-fat dairy foods, whole grains, and high-fiber foods. Lentils and dried peas and beans (legumes) are also good  choices.  Limit salt if told by your doctor.  Cook in a healthy way. Roast, grill, broil, bake, poach, steam, or stir-fry foods.  Limit fluids as told by your doctor.  Weigh yourself every morning. Do this after you pee (urinate) and before you eat breakfast. Write down your weight to give to your doctor.  Take your blood pressure and write it down if your doctor tell you to.  Ask your doctor how to check your pulse. Check your pulse as told.  Lose weight if told by your doctor.  Stop smoking or chewing tobacco. Do not use gum or patches that help you quit without your doctor's approval.  Schedule and go to doctor visits as told.  Nonpregnant women should have no more than 1 drink a day. Men should have no more than 2 drinks a day. Talk to your doctor about drinking alcohol.  Stop illegal drug use.  Stay current with shots (immunizations).  Manage your health conditions as told by your doctor.  Learn to manage your stress.  Rest when you are tired.  If it is really hot outside:  Avoid intense activities.  Use air conditioning or fans, or get in a cooler place.  Avoid caffeine and alcohol.  Wear loose-fitting, lightweight, and light-colored clothing.  If it is really cold outside:  Avoid intense activities.  Layer your clothing.  Wear mittens or gloves, a hat, and a scarf when going outside.  Avoid alcohol.  Learn about heart failure and get support as needed.  Get help to maintain or improve  your quality of life and your ability to care for yourself as needed. GET HELP IF:   You gain 03 lb/1.4 kg or more in 1 day or 05 lb/2.3 kg in a week.  You are more short of breath than usual.  You cannot do your normal activities.  You tire easily.  You cough more than normal, especially with activity.  You have any or more puffiness (swelling) in areas such as your hands, feet, ankles, or belly (abdomen).  You cannot sleep because it is hard to  breathe.  You feel like your heart is beating fast (palpitations).  You get dizzy or lightheaded when you stand up. GET HELP RIGHT AWAY IF:   You have trouble breathing.  There is a change in mental status, such as becoming less alert or not being able to focus.  You have chest pain or discomfort.  You faint. MAKE SURE YOU:   Understand these instructions.  Will watch your condition.  Will get help right away if you are not doing well or get worse. Document Released: 11/05/2007 Document Revised: 05/23/2012 Document Reviewed: 08/27/2011 [facility] Patient Information 2014 [place], [place].

## 2012-12-30 NOTE — Progress Notes (Signed)
Subjective:    Patient ID: Luis Glenn, male    DOB: 1928-01-18, 79 y.o.   MRN: [id number]  Shortness of Breath This is a new problem. The current episode started 1 to 4 weeks ago. The problem occurs daily. The problem has been gradually worsening. Duration: only during duration of exertion. Pertinent negatives include no abdominal pain, chest pain, fever, headaches, leg pain, orthopnea, PND, rash, rhinorrhea, sore throat, sputum production, syncope, vomiting or wheezing. Leg swelling: unchanged. The symptoms are aggravated by emotional upset and any activity. The patient has no known risk factors for DVT/PE. He has tried rest for the symptoms. The treatment provided moderate relief. There is no history of CAD, COPD, a heart failure or pneumonia.   Also concern for continued depression and irritability - unimproved with addition of bupropion as added 09/2012  Also R>L knee pain -? Shot. Denies injury or swelling   Past Medical History  Diagnosis Date  . GOUT   . PERIPHERAL VASCULAR DISEASE   . DIVERTICULOSIS, COLON   . BENIGN PROSTATIC HYPERTROPHY   . DEPRESSION   . DIABETES MELLITUS, TYPE II   . GERD   . HYPERLIPIDEMIA   . HYPERTENSION   . CATARACTS   . OSTEOARTHRITIS   . ANXIETY   . ALLERGIC RHINITIS    Family History  Problem Relation Age of Onset  . Diabetes Mother   . Hypertension Mother   . Diabetes Father   . Hypertension Father   . Alcohol abuse Other   . Breast cancer Other    History  Substance Use Topics  . Smoking status: Former Games developer  . Smokeless tobacco: Not on file     Comment: Married, lives with wife. Retired  . Alcohol Use: Yes     Comment: occassional    Review of Systems  Constitutional: Positive for fatigue. Negative for fever, chills and appetite change.  HENT: Negative for rhinorrhea and sore throat.   Respiratory: Positive for shortness of breath. Negative for sputum production, chest tightness and wheezing. Cough: occ.   Cardiovascular:  Negative for chest pain, palpitations, orthopnea, syncope and PND. Leg swelling: unchanged.  Gastrointestinal: Negative for nausea, vomiting and abdominal pain.  Skin: Negative for pallor, rash and wound.  Neurological: Negative for light-headedness, numbness and headaches.  Psychiatric/Behavioral: Positive for dysphoric mood and agitation. Negative for suicidal ideas, hallucinations, confusion, sleep disturbance, self-injury and decreased concentration. The patient is nervous/anxious. The patient is not hyperactive.   All other systems reviewed and are negative.       Objective:   Physical Exam BP 158/68  Pulse 81  Temp(Src) 97.2 F (36.2 C) (Oral)  Wt 224 lb 9.6 oz (101.878 kg)  SpO2 94% Wt Readings from Last 3 Encounters:  12/30/12 224 lb 9.6 oz (101.878 kg)  09/28/12 209 lb 1.9 oz (94.856 kg)  03/30/12 217 lb 12.8 oz (98.793 kg)   Constitutional: he appears fatigued, but well-developed and well-nourished. No distress.  Neck: Normal range of motion. Neck supple. No JVD present. No thyromegaly present.  Cardiovascular: Normal rate, regular rhythm and normal heart sounds.  No murmur heard. increase in chronic BLE edema 2+ pitting to knees. Pulmonary/Chest: Effort normal at rest; breath sounds diminished B bases and crackles R>L base. No respiratory distress. he has no wheezes.  Musculoskeletal: R>L knee - boggy synovitis - tender to palpation over joint line; FROM and ligamentous function intact. Normal range of motion, no joint effusions. No gross deformities.  Skin: Skin is warm and dry. No  rash noted. No erythema.  Psychiatric: he has a mildly irritable mood and agitation but cordial and polite mood and affect. behavior is normal. Judgment and thought content normal.  Lab Results  Component Value Date   WBC 7.0 09/23/2011   HGB 12.9* 09/23/2011   HCT 38.9* 09/23/2011   PLT 206.0 09/23/2011   GLUCOSE 98 09/28/2012   CHOL 135 09/28/2012   TRIG 106.0 09/28/2012   HDL 66.80 09/28/2012    LDLCALC 47 09/28/2012   ALT 18 09/23/2011   AST 25 09/23/2011   NA 140 09/28/2012   K 4.2 09/28/2012   CL 108 09/28/2012   CREATININE 1.3 09/28/2012   BUN 27* 09/28/2012   CO2 26 09/28/2012   TSH 1.28 09/28/2012   HGBA1C 5.2 09/28/2012       Assessment & Plan:    DOE x 6 weeks Weight gain and increase dep edema  Concerning for new CHF  Check CXR and labs Pt declines echo as prev declined stress testing (primary caregiver for demented wife) Increase lasix: 40 bid and add kcl 20 qd  R knee pain - refer to ortho  Also See problem list. Medications and labs reviewed today.   Time spent with pt today 45 minutes, greater than 50% time spent counseling patient on SOB and potential CHF, depression and medication review. Also review of prior records     ADDENDUM: CXR with R effusion and CM Labs with elevated BNP -  These consistent with CHF  infomred pt of same - still declines echo or other eval at present. Continue lasix 40 bid with Kcl and will recheck in 2 weeks, pt agrees to call sooner if worse or not improving

## 2013-01-03 ENCOUNTER — Other Ambulatory Visit: Payer: Self-pay | Admitting: Internal Medicine

## 2013-01-16 ENCOUNTER — Ambulatory Visit (INDEPENDENT_AMBULATORY_CARE_PROVIDER_SITE_OTHER): Payer: Medicare Other | Admitting: Internal Medicine

## 2013-01-16 ENCOUNTER — Encounter: Payer: Self-pay | Admitting: Internal Medicine

## 2013-01-16 VITALS — BP 136/66 | HR 88 | Temp 97.7°F | Resp 16 | Wt 207.0 lb

## 2013-01-16 DIAGNOSIS — I509 Heart failure, unspecified: Secondary | ICD-10-CM | POA: Diagnosis not present

## 2013-01-16 DIAGNOSIS — R0609 Other forms of dyspnea: Secondary | ICD-10-CM

## 2013-01-16 DIAGNOSIS — I5033 Acute on chronic diastolic (congestive) heart failure: Secondary | ICD-10-CM

## 2013-01-16 DIAGNOSIS — I10 Essential (primary) hypertension: Secondary | ICD-10-CM | POA: Diagnosis not present

## 2013-01-16 DIAGNOSIS — R0683 Snoring: Secondary | ICD-10-CM

## 2013-01-16 DIAGNOSIS — F329 Major depressive disorder, single episode, unspecified: Secondary | ICD-10-CM | POA: Diagnosis not present

## 2013-01-16 NOTE — Assessment & Plan Note (Signed)
Acute exacerbation 12/2012, improved with increase diuresis Down 17# in last 2 weeks Check lytes now Continues to decline cardiac eval, but less DOE and edema

## 2013-01-16 NOTE — Progress Notes (Signed)
  Subjective:    Patient ID: Luis Glenn, male    DOB: 11-Feb-1927, 79 y.o.   MRN: [id number]  HPI  Here for two-week followup: CHF, depression, right knee pain  Reports dtr (RN) visiting for holiday concerned for possible OSA   Past Medical History  Diagnosis Date  . GOUT   . PERIPHERAL VASCULAR DISEASE   . DIVERTICULOSIS, COLON   . BENIGN PROSTATIC HYPERTROPHY   . DEPRESSION   . DIABETES MELLITUS, TYPE II   . GERD   . HYPERLIPIDEMIA   . HYPERTENSION   . CATARACTS   . OSTEOARTHRITIS   . ANXIETY   . ALLERGIC RHINITIS     Review of Systems  Constitutional: Positive for fatigue. Negative for fever.  Respiratory: Negative for cough and shortness of breath.   Cardiovascular: Negative for chest pain and palpitations. Leg swelling: chronic, but improved.       Objective:   Physical Exam BP 136/66  Pulse 88  Temp(Src) 97.7 F (36.5 C) (Oral)  Resp 16  Wt 207 lb 0.6 oz (93.913 kg)  SpO2 95% Wt Readings from Last 3 Encounters:  01/16/13 207 lb 0.6 oz (93.913 kg)  12/30/12 224 lb 9.6 oz (101.878 kg)  09/28/12 209 lb 1.9 oz (94.856 kg)   Constitutional: he appears fatigued, but well-developed and well-nourished. No distress.  Neck: Normal range of motion. Neck supple. No JVD present. No thyromegaly present.  Cardiovascular: Normal rate, regular rhythm and normal heart sounds.  No murmur heard. return to chronic BLE edema -1+ ankles to mid calf. Pulmonary/Chest: Effort normal at rest; breath sounds diminished B bases but no respiratory distress. he has no wheezes and no crackles.  Musculoskeletal: R>L knee - boggy synovitis - tender to palpation over joint line; FROM and ligamentous function intact. Normal range of motion, no joint effusions. No gross deformities.  Psychiatric: he has a mildly dysphoric mood but cordial and polite mood and affect. behavior is normal. Judgment and thought content normal.  Lab Results  Component Value Date   WBC 7.0 12/30/2012   HGB 12.1*  12/30/2012   HCT 36.5* 12/30/2012   PLT 251.0 12/30/2012   GLUCOSE 107* 12/30/2012   CHOL 135 09/28/2012   TRIG 106.0 09/28/2012   HDL 66.80 09/28/2012   LDLCALC 47 09/28/2012   ALT 17 12/30/2012   AST 24 12/30/2012   NA 141 12/30/2012   K 4.1 12/30/2012   CL 107 12/30/2012   CREATININE 1.5 12/30/2012   BUN 21 12/30/2012   CO2 26 12/30/2012   TSH 2.36 12/30/2012   HGBA1C 5.2 09/28/2012       Assessment & Plan:   Snoring - send for sleep eval by pulm at family request  Also see problem list. Medications and labs reviewed today.

## 2013-01-16 NOTE — Assessment & Plan Note (Signed)
The current medical regimen is reasonably effective;  continue present plan and medications.  BP Readings from Last 3 Encounters:  01/16/13 136/66  12/30/12 158/68  09/28/12 160/72

## 2013-01-16 NOTE — Patient Instructions (Addendum)
It was good to see you today.  Test(s) ordered today. Your results will be released to MyChart (or called to you) after review, usually within 72hours after test completion. If any changes need to be made, you will be notified at that same time.  we'll make referral to sleep medicine for evaluation of possible sleep apnea. Our office will contact you regarding appointment(s) once made.  Medications reviewed and updated, continue Lasix twice daily. Refills provided  If you decide to proceed with cardiac evaluation for new congestive heart failure, please let me know and I will refer you for ultrasound  Followup in 3 months for general review, call sooner if problems Heart Failure Heart failure means your heart has trouble pumping blood. This makes it hard for your body to work well. Heart failure is usually a long-term (chronic) condition. You must take good care of yourself and follow your doctor's treatment plan. HOME CARE  Take your heart medicine as told by your doctor.  Do not stop taking medicine unless your doctor tells you to.  Do not skip any dose of medicine.  Refill your medicines before they run out.  Take other medicines only as told by your doctor or pharmacist.  Stay active if told by your doctor. The elderly and people with severe heart failure should talk with a doctor about physical activity.  Eat heart healthy foods. Choose foods that are without trans fat and are low in saturated fat, cholesterol, and salt (sodium). This includes fresh or frozen fruits and vegetables, fish, lean meats, fat-free or low-fat dairy foods, whole grains, and high-fiber foods. Lentils and dried peas and beans (legumes) are also good choices.  Limit salt if told by your doctor.  Cook in a healthy way. Roast, grill, broil, bake, poach, steam, or stir-fry foods.  Limit fluids as told by your doctor.  Weigh yourself every morning. Do this after you pee (urinate) and before you eat  breakfast. Write down your weight to give to your doctor.  Take your blood pressure and write it down if your doctor tell you to.  Ask your doctor how to check your pulse. Check your pulse as told.  Lose weight if told by your doctor.  Stop smoking or chewing tobacco. Do not use gum or patches that help you quit without your doctor's approval.  Schedule and go to doctor visits as told.  Nonpregnant women should have no more than 1 drink a day. Men should have no more than 2 drinks a day. Talk to your doctor about drinking alcohol.  Stop illegal drug use.  Stay current with shots (immunizations).  Manage your health conditions as told by your doctor.  Learn to manage your stress.  Rest when you are tired.  If it is really hot outside:  Avoid intense activities.  Use air conditioning or fans, or get in a cooler place.  Avoid caffeine and alcohol.  Wear loose-fitting, lightweight, and light-colored clothing.  If it is really cold outside:  Avoid intense activities.  Layer your clothing.  Wear mittens or gloves, a hat, and a scarf when going outside.  Avoid alcohol.  Learn about heart failure and get support as needed.  Get help to maintain or improve your quality of life and your ability to care for yourself as needed. GET HELP IF:   You gain 03 lb/1.4 kg or more in 1 day or 05 lb/2.3 kg in a week.  You are more short of breath than usual.  You cannot do your normal activities.  You tire easily.  You cough more than normal, especially with activity.  You have any or more puffiness (swelling) in areas such as your hands, feet, ankles, or belly (abdomen).  You cannot sleep because it is hard to breathe.  You feel like your heart is beating fast (palpitations).  You get dizzy or lightheaded when you stand up. GET HELP RIGHT AWAY IF:   You have trouble breathing.  There is a change in mental status, such as becoming less alert or not being able to  focus.  You have chest pain or discomfort.  You faint. MAKE SURE YOU:   Understand these instructions.  Will watch your condition.  Will get help right away if you are not doing well or get worse. Document Released: 11/05/2007 Document Revised: 05/23/2012 Document Reviewed: 08/27/2011 [facility] Patient Information 2014 [place], [place].

## 2013-01-16 NOTE — Assessment & Plan Note (Signed)
Chronic, exacerbated by declining health of spouse with dementia and caregiver status Hx same wellbutrin - tx 01/2010-05/2010 with same Remote tx sertraline - resumed same 11/2010 - last increase dose 12/2010 Increased to max dose 03/2012 due to increasing fatigue and apathy symptoms  Add Wellbutrin to SSRI 09/2012, but unimproved so will DC'd same 12/2012 Use low dose xanax as needed for anxiety/irritability symptoms Verified no plans for SI/HI Pt agrees to call if worsening depression or irritability symptoms

## 2013-01-17 ENCOUNTER — Other Ambulatory Visit (INDEPENDENT_AMBULATORY_CARE_PROVIDER_SITE_OTHER): Payer: Medicare Other

## 2013-01-17 DIAGNOSIS — I10 Essential (primary) hypertension: Secondary | ICD-10-CM

## 2013-01-17 DIAGNOSIS — I509 Heart failure, unspecified: Secondary | ICD-10-CM

## 2013-01-17 DIAGNOSIS — R0683 Snoring: Secondary | ICD-10-CM

## 2013-01-17 DIAGNOSIS — R0609 Other forms of dyspnea: Secondary | ICD-10-CM

## 2013-01-17 LAB — BASIC METABOLIC PANEL
CO2: 27 mEq/L (ref 19–32)
Calcium: 9.1 mg/dL (ref 8.4–10.5)
Creatinine, Ser: 1.7 mg/dL — ABNORMAL HIGH (ref 0.4–1.5)
Glucose, Bld: 128 mg/dL — ABNORMAL HIGH (ref 70–99)
Sodium: 136 mEq/L (ref 135–145)

## 2013-01-18 ENCOUNTER — Encounter: Payer: Self-pay | Admitting: Internal Medicine

## 2013-02-16 DIAGNOSIS — E1159 Type 2 diabetes mellitus with other circulatory complications: Secondary | ICD-10-CM | POA: Diagnosis not present

## 2013-02-16 DIAGNOSIS — L84 Corns and callosities: Secondary | ICD-10-CM | POA: Diagnosis not present

## 2013-02-16 DIAGNOSIS — L608 Other nail disorders: Secondary | ICD-10-CM | POA: Diagnosis not present

## 2013-02-16 DIAGNOSIS — I739 Peripheral vascular disease, unspecified: Secondary | ICD-10-CM | POA: Diagnosis not present

## 2013-02-20 ENCOUNTER — Other Ambulatory Visit: Payer: Self-pay | Admitting: Internal Medicine

## 2013-02-20 NOTE — Telephone Encounter (Signed)
Last filled 11/13/12 #90

## 2013-02-24 ENCOUNTER — Encounter: Payer: Self-pay | Admitting: Pulmonary Disease

## 2013-02-24 ENCOUNTER — Ambulatory Visit (INDEPENDENT_AMBULATORY_CARE_PROVIDER_SITE_OTHER): Payer: Medicare Other | Admitting: Pulmonary Disease

## 2013-02-24 VITALS — BP 118/72 | HR 78 | Temp 97.9°F | Ht 74.0 in | Wt 207.8 lb

## 2013-02-24 DIAGNOSIS — G4733 Obstructive sleep apnea (adult) (pediatric): Secondary | ICD-10-CM | POA: Diagnosis not present

## 2013-02-24 NOTE — Progress Notes (Signed)
Subjective:    Patient ID: Luis Glenn, male    DOB: 1927/04/29, 79 y.o.   MRN: [id number]  HPI The patient is an 80 year old male who I've been asked to see for possible obstructive sleep apnea. He has been noted to have loud snoring, and his daughter who is a nurse recently told him that he had witnessed apnea during the night. Patient states that he awakens 3-4 times a night, and is extremely tired in the mornings whenever he arises even if he sleeps through the night. He blames part of this on having to care for his ill wife, and also because of chronic pain associated with arthritis. He has definite sleepiness in the afternoons with inactivity, but also extreme fatigue. He denies any issues with sleepiness in the evenings or while driving. The patient states that his weight is down 20 pounds over the last 2 years, and his Epworth score today is 9.     Sleep Questionnaire What time do you typically go to bed?( Between what hours) 10-11pm 10-11pm at 1129 on 02/24/13 by Virl Cagey, CMA How long does it take you to fall asleep? varies minutes-hours varies minutes-hours at 1129 on 02/24/13 by Virl Cagey, CMA How many times during the night do you wake up? 26 caregiver for wife at 1129 on 02/24/13 by Virl Cagey, CMA What time do you get out of bed to start your day? No Value 9-10 at 1129 on 02/24/13 by Virl Cagey, CMA Do you drive or operate heavy machinery in your occupation? No No at 1129 on 02/24/13 by Virl Cagey, CMA How much has your weight changed (up or down) over the past two years? (In pounds) 20 lb (9.072 kg) 20 lb (9.072 kg) at 1129 on 02/24/13 by Virl Cagey, CMA Have you ever had a sleep study before? No No at 1129 on 02/24/13 by Virl Cagey, CMA Do you currently use CPAP? No No at 1129 on 02/24/13 by Virl Cagey, CMA Do you wear oxygen at any time? No No at 1129 on 02/24/13 by Virl Cagey, CMA   Review of Systems   Constitutional: Positive for unexpected weight change. Negative for fever.  HENT: Positive for congestion and sneezing. Negative for dental problem, ear pain, nosebleeds, postnasal drip, rhinorrhea, sinus pressure, sore throat and trouble swallowing.   Eyes: Negative for redness and itching.  Respiratory: Positive for cough and shortness of breath. Negative for chest tightness and wheezing.   Cardiovascular: Positive for palpitations. Negative for leg swelling.  Gastrointestinal: Negative for nausea and vomiting.  Genitourinary: Negative for dysuria.  Musculoskeletal: Positive for joint swelling.  Skin: Negative for rash.  Neurological: Negative for headaches.  Hematological: Does not bruise/bleed easily.  Psychiatric/Behavioral: Positive for dysphoric mood. The patient is not nervous/anxious.        Objective:   Physical Exam Constitutional:  Overweight male, no acute distress  HENT:  Nares narrowed bilat, edematous mucosa  Oropharynx without exudate, palate and uvula are normal  Eyes:  Perrla, eomi, no scleral icterus  Neck:  No JVD, no TMG  Cardiovascular:  Normal rate, slightly irregular rhythm, no rubs or gallops.  No murmurs        Pulses difficult to palpate  Pulmonary :  Normal breath sounds, no stridor or respiratory distress   No rales, rhonchi, or wheezing  Abdominal:  Soft, nondistended, bowel sounds present.  No tenderness noted.   Musculoskeletal:  3+ lower extremity edema  noted.  Lymph Nodes:  No cervical lymphadenopathy noted  Skin:  No cyanosis noted  Neurologic:  Appears sleepy, but appropriate, moves all 4 extremities without obvious deficit.         Assessment & Plan:

## 2013-02-24 NOTE — Patient Instructions (Signed)
Will schedule for a sleep study once you call and give [place] a date. Will arrange for followup once the results are available.

## 2013-02-24 NOTE — Assessment & Plan Note (Signed)
The patient's history is suggestive of obstructive sleep apnea, and I have had a long discussion with him about the pathophysiology. I have also discussed with him its impact to his quality of life and potentially his cardiovascular health. At this point, I would recommend that he have a sleep study, and the patient is agreeable. Will arrange for followup once the results are available.

## 2013-03-03 ENCOUNTER — Other Ambulatory Visit: Payer: Self-pay | Admitting: Internal Medicine

## 2013-03-07 ENCOUNTER — Other Ambulatory Visit: Payer: Self-pay | Admitting: Internal Medicine

## 2013-03-07 NOTE — Telephone Encounter (Signed)
Faxed script back to [facility]...Johny Chess

## 2013-03-29 ENCOUNTER — Ambulatory Visit (INDEPENDENT_AMBULATORY_CARE_PROVIDER_SITE_OTHER): Payer: Medicare Other | Admitting: Internal Medicine

## 2013-03-29 ENCOUNTER — Other Ambulatory Visit (INDEPENDENT_AMBULATORY_CARE_PROVIDER_SITE_OTHER): Payer: Medicare Other

## 2013-03-29 ENCOUNTER — Encounter: Payer: Self-pay | Admitting: *Deleted

## 2013-03-29 ENCOUNTER — Encounter: Payer: Self-pay | Admitting: Internal Medicine

## 2013-03-29 VITALS — BP 132/62 | HR 79 | Temp 97.7°F | Wt 206.4 lb

## 2013-03-29 DIAGNOSIS — I10 Essential (primary) hypertension: Secondary | ICD-10-CM

## 2013-03-29 DIAGNOSIS — G4733 Obstructive sleep apnea (adult) (pediatric): Secondary | ICD-10-CM | POA: Diagnosis not present

## 2013-03-29 DIAGNOSIS — E119 Type 2 diabetes mellitus without complications: Secondary | ICD-10-CM

## 2013-03-29 DIAGNOSIS — M1990 Unspecified osteoarthritis, unspecified site: Secondary | ICD-10-CM

## 2013-03-29 DIAGNOSIS — Z79899 Other long term (current) drug therapy: Secondary | ICD-10-CM | POA: Diagnosis not present

## 2013-03-29 LAB — BASIC METABOLIC PANEL
BUN: 27 mg/dL — AB (ref 6–23)
CALCIUM: 9.3 mg/dL (ref 8.4–10.5)
CHLORIDE: 104 meq/L (ref 96–112)
CO2: 26 mEq/L (ref 19–32)
CREATININE: 1.5 mg/dL (ref 0.4–1.5)
GFR: 48.53 mL/min — ABNORMAL LOW (ref >60.00)
GLUCOSE: 130 mg/dL — AB (ref 70–99)
Potassium: 4 mEq/L (ref 3.5–5.1)
Sodium: 139 mEq/L (ref 135–145)

## 2013-03-29 LAB — HEMOGLOBIN A1C: Hgb A1c MFr Bld: 5.2 % (ref 4.6–6.5)

## 2013-03-29 MED ORDER — OXYCODONE HCL 5 MG PO TABS: 5.0000 mg | ORAL_TABLET | Freq: Four times a day (QID) | ORAL | Status: DC | PRN

## 2013-03-29 NOTE — Assessment & Plan Note (Signed)
Concerns for same reviewed Evaluation by sleep specialist Dr. Braulio Conte January 2015, referred for sleep study the patient has not yet scheduled same Reviewed barriers to scheduling, patient agrees to reconsider and plans to schedule for March 2015 with family support is available to assist with care of (demented) wife overnight during patient's sleep study

## 2013-03-29 NOTE — Assessment & Plan Note (Signed)
Does not regularly check sugars -  DC'd low dose metformin late summer 2014 - now "diet controlled" On ACEI and statin Check a1c today Lab Results  Component Value Date   HGBA1C 5.2 09/28/2012

## 2013-03-29 NOTE — Progress Notes (Signed)
Subjective:    Patient ID: Luis Glenn, male    DOB: 1927-05-25, 80 y.o.   MRN: [id number]  HPI  Here for followup - reviewed chronic medical issues and interval medical events   Past Medical History  Diagnosis Date  . GOUT   . PERIPHERAL VASCULAR DISEASE   . DIVERTICULOSIS, COLON   . BENIGN PROSTATIC HYPERTROPHY   . DEPRESSION   . DIABETES MELLITUS, TYPE II   . GERD   . HYPERLIPIDEMIA   . HYPERTENSION   . CATARACTS   . OSTEOARTHRITIS   . ANXIETY   . ALLERGIC RHINITIS     Review of Systems  Constitutional: Positive for fatigue. Negative for fever.  Respiratory: Negative for cough and shortness of breath.   Cardiovascular: Negative for chest pain and palpitations. Leg swelling: chronic, but improved.  Musculoskeletal: Positive for arthralgias (R knee) and back pain. Negative for joint swelling and myalgias.       Objective:   Physical Exam BP 132/62  Pulse 79  Temp(Src) 97.7 F (36.5 C) (Oral)  Wt 206 lb 6.4 oz (93.622 kg)  SpO2 95% Wt Readings from Last 3 Encounters:  03/29/13 206 lb 6.4 oz (93.622 kg)  02/24/13 207 lb 12.8 oz (94.257 kg)  01/16/13 207 lb 0.6 oz (93.913 kg)   Constitutional: he appears fatigued, but well-developed and well-nourished. No distress.  Neck: Normal range of motion. Neck supple. No JVD present. No thyromegaly present.  Cardiovascular: Normal rate, regular rhythm and normal heart sounds.  No murmur heard. return to chronic BLE edema 1+ ankles to mid calf. Pulmonary/Chest: Effort normal at rest; breath sounds diminished B bases but no respiratory distress. he has no wheezes and no crackles.  Psychiatric: he has a mildly dysphoric mood but cordial and polite mood and affect. behavior is normal. Judgment and thought content normal.  Lab Results  Component Value Date   WBC 7.0 12/30/2012   HGB 12.1* 12/30/2012   HCT 36.5* 12/30/2012   PLT 251.0 12/30/2012   GLUCOSE 128* 01/17/2013   CHOL 135 09/28/2012   TRIG 106.0 09/28/2012   HDL  66.80 09/28/2012   LDLCALC 47 09/28/2012   ALT 17 12/30/2012   AST 24 12/30/2012   NA 136 01/17/2013   K 4.0 01/17/2013   CL 101 01/17/2013   CREATININE 1.7* 01/17/2013   BUN 22 01/17/2013   CO2 27 01/17/2013   TSH 2.36 12/30/2012   HGBA1C 5.2 09/28/2012       Assessment & Plan:   Problem List Items Addressed This Visit   DIABETES MELLITUS, TYPE II - Primary      Does not regularly check sugars -  DC'd low dose metformin late summer 2014 - now "diet controlled" On ACEI and statin Check a1c today Lab Results  Component Value Date   HGBA1C 5.2 09/28/2012      Relevant Orders      Hemoglobin A1c      Basic metabolic panel   HYPERTENSION      The current medical regimen is reasonably effective;  continue present plan and medications.  BP Readings from Last 3 Encounters:  03/29/13 132/62  02/24/13 118/72  01/16/13 136/66      Relevant Orders      Basic metabolic panel   OSA (obstructive sleep apnea)     Concerns for same reviewed Evaluation by sleep specialist Dr. Braulio Conte January 2015, referred for sleep study the patient has not yet scheduled same Reviewed barriers to scheduling, patient agrees to reconsider  and plans to schedule for March 2015 with family support is available to assist with care of (demented) wife overnight during patient's sleep study    OSTEOARTHRITIS     Uses prn oxy 5mg  for OA pains (knee, back, hip, neck) declines injection or specialist eval No new symptoms or effusions on exam    Relevant Medications      oxyCODONE (Oxy IR/ROXICODONE) immediate release tablet

## 2013-03-29 NOTE — Progress Notes (Signed)
Pre-visit discussion using our clinic review tool. No additional management support is needed unless otherwise documented below in the visit note.  

## 2013-03-29 NOTE — Assessment & Plan Note (Signed)
The current medical regimen is reasonably effective;  continue present plan and medications.  BP Readings from Last 3 Encounters:  03/29/13 132/62  02/24/13 118/72  01/16/13 136/66

## 2013-03-29 NOTE — Assessment & Plan Note (Signed)
Uses prn oxy 5mg  for OA pains (knee, back, hip, neck) declines injection or specialist eval No new symptoms or effusions on exam

## 2013-03-29 NOTE — Patient Instructions (Signed)
It was good to see you today.  We have reviewed your prior records including labs and tests today  Medications reviewed and updated, no changes recommended at this time. Prescription for oxycodone provided for you to use as needed for arthritis and knee pain symptoms  Test(s) ordered today. Your results will be released to [place] (or called to you) after review, usually within 72hours after test completion. If any changes need to be made, you will be notified at that same time.  followup with sleep study as discussed in March 2015. Please call if problems scheduling same  Please schedule followup in 6 months, call sooner if problems.

## 2013-04-03 ENCOUNTER — Other Ambulatory Visit: Payer: Self-pay | Admitting: Internal Medicine

## 2013-04-21 ENCOUNTER — Other Ambulatory Visit: Payer: Self-pay | Admitting: Internal Medicine

## 2013-04-24 ENCOUNTER — Encounter: Payer: Self-pay | Admitting: Internal Medicine

## 2013-04-26 ENCOUNTER — Other Ambulatory Visit: Payer: Self-pay | Admitting: Internal Medicine

## 2013-05-03 ENCOUNTER — Other Ambulatory Visit: Payer: Self-pay | Admitting: Internal Medicine

## 2013-05-03 NOTE — Telephone Encounter (Signed)
[facility]...Luis Glenn

## 2013-05-08 ENCOUNTER — Other Ambulatory Visit: Payer: Self-pay | Admitting: Internal Medicine

## 2013-05-09 ENCOUNTER — Other Ambulatory Visit: Payer: Self-pay | Admitting: Internal Medicine

## 2013-05-09 NOTE — Telephone Encounter (Signed)
[facility],.../lmb

## 2013-05-11 DIAGNOSIS — L608 Other nail disorders: Secondary | ICD-10-CM | POA: Diagnosis not present

## 2013-05-11 DIAGNOSIS — I739 Peripheral vascular disease, unspecified: Secondary | ICD-10-CM | POA: Diagnosis not present

## 2013-05-11 DIAGNOSIS — E1159 Type 2 diabetes mellitus with other circulatory complications: Secondary | ICD-10-CM | POA: Diagnosis not present

## 2013-05-11 DIAGNOSIS — L84 Corns and callosities: Secondary | ICD-10-CM | POA: Diagnosis not present

## 2013-06-01 ENCOUNTER — Other Ambulatory Visit: Payer: Self-pay | Admitting: Internal Medicine

## 2013-06-02 MED ORDER — OXYCODONE HCL 5 MG PO TABS: 5.0000 mg | ORAL_TABLET | Freq: Four times a day (QID) | ORAL | Status: DC | PRN

## 2013-06-02 NOTE — Telephone Encounter (Signed)
Ok to print and i will sign for pick up Monday

## 2013-06-02 NOTE — Telephone Encounter (Signed)
Printed rx will call on Monday for pick-up once md sign.../lmb 

## 2013-06-04 ENCOUNTER — Telehealth: Payer: Self-pay | Admitting: Internal Medicine

## 2013-06-05 MED ORDER — OXYCODONE HCL 5 MG PO TABS: 5.0000 mg | ORAL_TABLET | Freq: Four times a day (QID) | ORAL | Status: DC | PRN

## 2013-06-05 MED ORDER — ALPRAZOLAM 0.5 MG PO TABS: 0.5000 mg | ORAL_TABLET | Freq: Three times a day (TID) | ORAL | Status: DC | PRN

## 2013-07-26 ENCOUNTER — Telehealth: Payer: Self-pay

## 2013-07-26 MED ORDER — OXYCODONE HCL 5 MG PO TABS: 5.0000 mg | ORAL_TABLET | Freq: Four times a day (QID) | ORAL | Status: DC | PRN

## 2013-07-26 NOTE — Telephone Encounter (Signed)
Notified pt rx ready for pick-up.../lmb 

## 2013-07-26 NOTE — Telephone Encounter (Signed)
Pt requesting refilll on oxycodne.  Pt was last seen on 2/18/ 2015, pt has an appt for 09/27/2013. Please advise

## 2013-07-26 NOTE — Telephone Encounter (Signed)
Ok, done 

## 2013-08-21 ENCOUNTER — Other Ambulatory Visit: Payer: Self-pay | Admitting: Internal Medicine

## 2013-09-08 ENCOUNTER — Other Ambulatory Visit: Payer: Self-pay | Admitting: Internal Medicine

## 2013-09-12 ENCOUNTER — Other Ambulatory Visit: Payer: Self-pay | Admitting: Internal Medicine

## 2013-09-14 ENCOUNTER — Encounter: Payer: Self-pay | Admitting: Internal Medicine

## 2013-09-14 ENCOUNTER — Ambulatory Visit (INDEPENDENT_AMBULATORY_CARE_PROVIDER_SITE_OTHER): Payer: Medicare Other | Admitting: Internal Medicine

## 2013-09-14 VITALS — BP 138/50 | HR 55 | Temp 97.9°F | Ht 74.0 in | Wt 198.5 lb

## 2013-09-14 DIAGNOSIS — M1990 Unspecified osteoarthritis, unspecified site: Secondary | ICD-10-CM | POA: Diagnosis not present

## 2013-09-14 DIAGNOSIS — E785 Hyperlipidemia, unspecified: Secondary | ICD-10-CM | POA: Diagnosis not present

## 2013-09-14 DIAGNOSIS — I509 Heart failure, unspecified: Secondary | ICD-10-CM | POA: Diagnosis not present

## 2013-09-14 DIAGNOSIS — E119 Type 2 diabetes mellitus without complications: Secondary | ICD-10-CM

## 2013-09-14 MED ORDER — OXYCODONE HCL 5 MG PO TABS: 5.0000 mg | ORAL_TABLET | Freq: Four times a day (QID) | ORAL | Status: DC | PRN

## 2013-09-14 NOTE — Assessment & Plan Note (Signed)
Uses prn oxy 5mg  for OA pains (knee, back, hip, neck) - refill today Check plain film c-spine and knees and refer to sports med for consideration of IA injection  No new symptoms or effusions on exam

## 2013-09-14 NOTE — Assessment & Plan Note (Signed)
On simvastatin - last lipids at goal, check annually The current medical regimen is effective;  continue present plan and medications.

## 2013-09-14 NOTE — Patient Instructions (Signed)
It was good to see you today. Thanks for talking with me about your loss  We have reviewed your prior records including labs and tests today  Test(s) ordered today - labs and xray. Your results will be released to [place] (or called to you) after review, usually within 72hours after test completion. If any changes need to be made, you will be notified at that same time.  Medications reviewed and updated, no changes recommended at this time.  Prescription for compression hose given to you today -  Also application for handicap tag  we'll make referral to Dr Tamala Julian for your knees. Our office will contact you regarding appointment(s) once made.  Please schedule followup in 3-6 months, call sooner if problems.

## 2013-09-14 NOTE — Assessment & Plan Note (Signed)
Does not regularly check sugars -  DC'd low dose metformin late summer 2014 - now "diet controlled" On ACEI and statin Check a1c today Lab Results  Component Value Date   HGBA1C 5.2 03/29/2013

## 2013-09-14 NOTE — Progress Notes (Signed)
Subjective:    Patient ID: Luis Glenn, male    DOB: 04-10-1927, 80 y.o.   MRN: [id number]  HPI  Patient here for follow up Reviewed chronic medical issues and interval medical events  Past Medical History  Diagnosis Date  . GOUT   . PERIPHERAL VASCULAR DISEASE   . DIVERTICULOSIS, COLON   . BENIGN PROSTATIC HYPERTROPHY   . DEPRESSION   . DIABETES MELLITUS, TYPE II   . GERD   . HYPERLIPIDEMIA   . HYPERTENSION   . CATARACTS   . OSTEOARTHRITIS   . ANXIETY   . ALLERGIC RHINITIS     Review of Systems  Constitutional: Negative for fatigue and unexpected weight change.  Respiratory: Negative for cough and shortness of breath.   Cardiovascular: Positive for leg swelling (chronic, unchanged). Negative for chest pain and palpitations.  Musculoskeletal: Positive for arthralgias, back pain, gait problem and neck pain. Negative for joint swelling.  Psychiatric/Behavioral: Positive for sleep disturbance and dysphoric mood (grief since death of spouse July 12, 2013 - awaiting group support/counseling via hospice services, declines individual counseling). Negative for suicidal ideas, behavioral problems, self-injury and decreased concentration.       Objective:   Physical Exam  BP 138/50  Pulse 55  Temp(Src) 97.9 F (36.6 C) (Oral)  Ht 6\' 2"  (1.88 m)  Wt 198 lb 8 oz (90.039 kg)  BMI 25.48 kg/m2  SpO2 98% Wt Readings from Last 3 Encounters:  09/14/13 198 lb 8 oz (90.039 kg)  03/29/13 206 lb 6.4 oz (93.622 kg)  02/24/13 207 lb 12.8 oz (94.257 kg)   Constitutional: he appears well-developed and well-nourished. No distress.  Neck: Normal range of motion. Neck supple. No JVD present. No thyromegaly present.  Cardiovascular: Normal rate, regular rhythm and normal heart sounds.  No murmur heard. chronic 1+ pitting BLE edema to below knee with venous insuff skin changes B shins. Pulmonary/Chest: Effort normal and breath sounds normal. No respiratory distress. he has no wheezes. Skin:  venous insuff with "crackling and scaling BLE but no erythema or weeping  Psychiatric: he has an appropriately dysphoric, occ tearful mood and affect (1st OV since spouse passed 07/12/13). His behavior is normal. Judgment and thought content normal.   Lab Results  Component Value Date   WBC 7.0 12/30/2012   HGB 12.1* 12/30/2012   HCT 36.5* 12/30/2012   PLT 251.0 12/30/2012   GLUCOSE 130* 03/29/2013   CHOL 135 09/28/2012   TRIG 106.0 09/28/2012   HDL 66.80 09/28/2012   LDLCALC 47 09/28/2012   ALT 17 12/30/2012   AST 24 12/30/2012   NA 139 03/29/2013   K 4.0 03/29/2013   CL 104 03/29/2013   CREATININE 1.5 03/29/2013   BUN 27* 03/29/2013   CO2 26 03/29/2013   TSH 2.36 12/30/2012   HGBA1C 5.2 03/29/2013    Dg Chest 2 View  12/30/2012   CLINICAL DATA:  Shortness of breath and cough.  Former smoker  EXAM: CHEST  2 VIEW  COMPARISON:  None.  FINDINGS: There is moderate to marked cardiac enlargement. There is a small right pleural effusion. No interstitial edema. No airspace consolidation. The visualized osseous structures appear intact.  IMPRESSION: 1. Cardiac enlargement. 2. Small right effusion.   Electronically Signed   By: Kerby Moors M.D.   On: 12/30/2012 16:30       Assessment & Plan:   Problem List Items Addressed This Visit   CHF (congestive heart failure) - Primary     Acute exacerbation 12/2012, improved  with increase diuresis Declines cardiac eval (stress, echo, consult) Check lytes now rx compression hose for peripheral edema    Relevant Orders      DME Other see comment   DIABETES MELLITUS, TYPE II      Does not regularly check sugars -  DC'd low dose metformin late summer 2014 - now "diet controlled" On ACEI and statin Check a1c today Lab Results  Component Value Date   HGBA1C 5.2 03/29/2013      Relevant Orders      Hemoglobin A1c      Lipid panel      Basic metabolic panel      DME Other see comment   HYPERLIPIDEMIA     On simvastatin - last lipids at goal,  check annually The current medical regimen is effective;  continue present plan and medications.    OSTEOARTHRITIS     Uses prn oxy 5mg  for OA pains (knee, back, hip, neck) - refill today Check plain film c-spine and knees and refer to sports med for consideration of IA injection  No new symptoms or effusions on exam    Relevant Medications      oxyCODONE (Oxy IR/ROXICODONE) immediate release tablet   Other Relevant Orders      DG Cervical Spine 2 or 3 views      DG Knee Complete 4 Views Left      DG Knee Complete 4 Views Right      Ambulatory referral to Sports Medicine      DME Other see comment

## 2013-09-14 NOTE — Progress Notes (Signed)
Pre visit review using our clinic review tool, if applicable. No additional management support is needed unless otherwise documented below in the visit note. 

## 2013-09-14 NOTE — Assessment & Plan Note (Signed)
Acute exacerbation 12/2012, improved with increase diuresis Declines cardiac eval (stress, echo, consult) Check lytes now rx compression hose for peripheral edema

## 2013-09-18 ENCOUNTER — Other Ambulatory Visit (INDEPENDENT_AMBULATORY_CARE_PROVIDER_SITE_OTHER): Payer: Medicare Other

## 2013-09-18 ENCOUNTER — Ambulatory Visit (INDEPENDENT_AMBULATORY_CARE_PROVIDER_SITE_OTHER)
Admission: RE | Admit: 2013-09-18 | Discharge: 2013-09-18 | Disposition: A | Discharge status: Home / Self Care | Payer: Medicare Other | Source: Ambulatory Visit | Attending: Internal Medicine | Admitting: Internal Medicine

## 2013-09-18 DIAGNOSIS — IMO0002 Reserved for concepts with insufficient information to code with codable children: Secondary | ICD-10-CM | POA: Diagnosis not present

## 2013-09-18 DIAGNOSIS — M25569 Pain in unspecified knee: Secondary | ICD-10-CM | POA: Diagnosis not present

## 2013-09-18 DIAGNOSIS — E119 Type 2 diabetes mellitus without complications: Secondary | ICD-10-CM | POA: Diagnosis not present

## 2013-09-18 DIAGNOSIS — M1990 Unspecified osteoarthritis, unspecified site: Secondary | ICD-10-CM | POA: Diagnosis not present

## 2013-09-18 DIAGNOSIS — M25469 Effusion, unspecified knee: Secondary | ICD-10-CM | POA: Diagnosis not present

## 2013-09-18 DIAGNOSIS — M47812 Spondylosis without myelopathy or radiculopathy, cervical region: Secondary | ICD-10-CM | POA: Diagnosis not present

## 2013-09-18 DIAGNOSIS — M1710 Unilateral primary osteoarthritis, unspecified knee: Secondary | ICD-10-CM | POA: Diagnosis not present

## 2013-09-18 LAB — LIPID PANEL
Cholesterol: 116 mg/dL (ref 0–200)
HDL: 61.5 mg/dL (ref >39.00)
LDL Cholesterol: 46 mg/dL (ref 0–99)
NonHDL: 54.5
Total CHOL/HDL Ratio: 2
Triglycerides: 41 mg/dL (ref 0.0–149.0)
VLDL: 8.2 mg/dL (ref 0.0–40.0)

## 2013-09-18 LAB — BASIC METABOLIC PANEL
BUN: 46 mg/dL — ABNORMAL HIGH (ref 6–23)
CO2: 28 mEq/L (ref 19–32)
Calcium: 9.3 mg/dL (ref 8.4–10.5)
Chloride: 106 mEq/L (ref 96–112)
Creatinine, Ser: 1.5 mg/dL (ref 0.4–1.5)
GFR: 48.09 mL/min — ABNORMAL LOW (ref >60.00)
Glucose, Bld: 114 mg/dL — ABNORMAL HIGH (ref 70–99)
Potassium: 4.1 mEq/L (ref 3.5–5.1)
Sodium: 140 mEq/L (ref 135–145)

## 2013-09-18 LAB — HEMOGLOBIN A1C: Hgb A1c MFr Bld: 5 % (ref 4.6–6.5)

## 2013-09-27 ENCOUNTER — Ambulatory Visit: Payer: Medicare Other | Admitting: Internal Medicine

## 2013-09-29 ENCOUNTER — Other Ambulatory Visit: Payer: Self-pay | Admitting: Internal Medicine

## 2013-09-29 ENCOUNTER — Ambulatory Visit (INDEPENDENT_AMBULATORY_CARE_PROVIDER_SITE_OTHER): Payer: Medicare Other | Admitting: Family Medicine

## 2013-09-29 ENCOUNTER — Encounter: Payer: Self-pay | Admitting: Family Medicine

## 2013-09-29 VITALS — BP 136/62 | HR 78 | Ht 74.0 in | Wt 195.0 lb

## 2013-09-29 DIAGNOSIS — Z23 Encounter for immunization: Secondary | ICD-10-CM | POA: Diagnosis not present

## 2013-09-29 DIAGNOSIS — M1710 Unilateral primary osteoarthritis, unspecified knee: Secondary | ICD-10-CM | POA: Diagnosis not present

## 2013-09-29 DIAGNOSIS — F3289 Other specified depressive episodes: Secondary | ICD-10-CM

## 2013-09-29 DIAGNOSIS — F329 Major depressive disorder, single episode, unspecified: Secondary | ICD-10-CM

## 2013-09-29 NOTE — Progress Notes (Signed)
[facility] [address] [place], [place] [address] Phone: [phone] Subjective:     CC: Arthralgia  NTI:[id number] Luis Glenn is a 80 y.o. male coming in with complaint of multiple joint pains. Patient states he has pain in multiple joints overall but he has noticed that both of his knees have been worse. Patient states that they are severe enough that it is stopping him from some activity. 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 She unfortunately passed away 2 months ago. Patient states since that time he has not been doing as much activity. States that when he does he has more pain on the medial aspects of the knees bilaterally. Continues to have difficulty with swelling of the legs bilaterally. Patient is being treated for his decrease in depression with medications he states that are doing very well with no side effects. Patient describes the pain in his knees with a dull aching sensation and rates it as 8/10 in severity.    x-rays were reviewed by me to patient's knee x-ray to have mild to moderate osteoarthritis changes. Calcified peripheral vascular disease is noted.  Past medical history, social, surgical and family history all reviewed in electronic medical record.   Review of Systems: No headache, visual changes, nausea, vomiting, diarrhea, constipation, dizziness, abdominal pain, skin rash, fevers, chills, night sweats, weight loss, swollen lymph nodes, body aches, joint swelling, muscle aches, chest pain, shortness of breath, mood changes.   Objective Blood pressure 136/62, pulse 78, height 6\' 2"  (1.88 m), weight 195 lb (88.451 kg), SpO2 99.00%.  General: No apparent distress alert and oriented x3 mood and affect normal, dressed appropriately.  HEENT: Pupils equal, extraocular movements intact  Respiratory: Patient's speak in full sentences and does not appear short of breath    Cardiovascular: +2 pitting edema no signs of cellulitis non tender, no erythema  Skin: Warm dry intact with no signs of infection or rash on extremities or on axial skeleton.  Abdomen: Soft nontender  Neuro: Cranial nerves II through XII are intact, neurovascularly intact in all extremities with 2+ DTRs and 2+ pulses.  Lymph: No lymphadenopathy of posterior or anterior cervical chain or axillae bilaterally.  Gait normal with good balance and coordination.  MSK:  Non tender with full range of motion and good stability and symmetric strength and tone of shoulders, elbows, wrist, hip, and ankles bilaterally. Moderate osteophytic changes at multiple joints.   Knee: Bilaterally Moderate osteophytic changes. Palpation normal with no warmth, joint line tenderness, patellar tenderness, or condyle tenderness. ROM full in flexion and extension and lower leg rotation. Ligaments with solid consistent endpoints including ACL, PCL, LCL, MCL. Negative Mcmurray's, Apley's, and Thessalonian tests.  painful patellar compression. Patellar glide with moderate crepitus. Patellar and quadriceps tendons unremarkable. Hamstring and quadriceps strength is normal.    After informed written and verbal consent, patient was seated on exam table. Right knee was prepped with alcohol swab and utilizing anterolateral approach, patient's right knee space was injected with 4:1  marcaine 0.5%: Kenalog 40mg /dL. Patient tolerated the procedure well without immediate complications.   After informed written and verbal consent, patient was seated on exam table. Left knee was prepped with alcohol swab and utilizing anterolateral approach, patient's left knee space was injected with 4:1  marcaine 0.5%: Kenalog 40mg /dL. Patient tolerated the procedure well without immediate complications.   Impression and Recommendations:     This case  required medical decision making of moderate complexity.

## 2013-09-29 NOTE — Assessment & Plan Note (Signed)
Discussed with patient at great length. Patient seems to be doing fairly well with the loss of his wife. Patient is going to grieving counseling. Patient denies any suicidal or homicidal ideation. Patient has been losing weight and we discussed about protein supplementation that could be helpful. Patient continues to have trouble I would consider changing his sertraline to Remeron to help with appetite stimulation. We'll discuss a followup.

## 2013-09-29 NOTE — Patient Instructions (Signed)
Very nice to meet you Ice 20 minutes 2 times daily. Usually after activity and before bed. Exercises 3 times a week.  Try the pennsaid 2 times daily and if helps clal [place] and we will send in a prescription.  Take tylenol 650 mg three times a day is the best evidence based medicine we have for arthritis.  Glucosamine sulfate 750mg  twice a day is a supplement that has been shown to help moderate to severe arthritis. Vitamin D 2000 IU daily Fish oil 2 grams daily.  Tumeric 500mg  twice daily.  Capsaicin topically up to four times a day may also help with pain. Cortisone injections are an option if these interventions do not seem to make a difference or need more relief.  If cortisone injections do not help, there are different types of shots that may help but they take longer to take effect.  We can discuss this at follow up.  It's important that you continue to stay active. Consider physical therapy to strengthen muscles around the joint that hurts to take pressure off of the joint itself. Shoe inserts with good arch support may be helpful.  [employer] orthotics at [employer] [facility] could help.  Water aerobics and cycling with low resistance are the best two types of exercise for arthritis. Come back and see me in 3 weeks.  I am sorry for your loss.

## 2013-09-29 NOTE — Assessment & Plan Note (Signed)
Patient does have osteophytic changes and knees bilaterally. Patient was given injection today with good results. Because patient's right knee is hurting him worse we did try to get him a medial unloader brace to see if this could be beneficial. We discussed an icing protocol, over-the-counter medications, as well as topical anti-inflammatories. Patient will try these changes and come back and see me again in 3-4 weeks.

## 2013-10-10 ENCOUNTER — Encounter: Payer: Self-pay | Admitting: Nurse Practitioner

## 2013-10-10 ENCOUNTER — Other Ambulatory Visit (INDEPENDENT_AMBULATORY_CARE_PROVIDER_SITE_OTHER): Payer: Medicare Other

## 2013-10-10 ENCOUNTER — Ambulatory Visit (INDEPENDENT_AMBULATORY_CARE_PROVIDER_SITE_OTHER): Payer: Medicare Other | Admitting: Nurse Practitioner

## 2013-10-10 VITALS — BP 142/58 | HR 72 | Temp 97.4°F | Ht 74.0 in | Wt 188.0 lb

## 2013-10-10 DIAGNOSIS — I509 Heart failure, unspecified: Secondary | ICD-10-CM | POA: Diagnosis not present

## 2013-10-10 DIAGNOSIS — E119 Type 2 diabetes mellitus without complications: Secondary | ICD-10-CM | POA: Diagnosis not present

## 2013-10-10 DIAGNOSIS — R634 Abnormal weight loss: Secondary | ICD-10-CM

## 2013-10-10 LAB — COMPREHENSIVE METABOLIC PANEL
ALBUMIN: 3.8 g/dL (ref 3.5–5.2)
ALT: 21 U/L (ref 0–53)
AST: 26 U/L (ref 0–37)
Alkaline Phosphatase: 169 U/L — ABNORMAL HIGH (ref 39–117)
BUN: 41 mg/dL — ABNORMAL HIGH (ref 6–23)
CALCIUM: 9.1 mg/dL (ref 8.4–10.5)
CHLORIDE: 108 meq/L (ref 96–112)
CO2: 24 meq/L (ref 19–32)
Creatinine, Ser: 1.5 mg/dL (ref 0.4–1.5)
GFR: 47.72 mL/min — AB (ref >60.00)
GLUCOSE: 148 mg/dL — AB (ref 70–99)
POTASSIUM: 4 meq/L (ref 3.5–5.1)
SODIUM: 141 meq/L (ref 135–145)
TOTAL PROTEIN: 6.8 g/dL (ref 6.0–8.3)
Total Bilirubin: 0.8 mg/dL (ref 0.2–1.2)

## 2013-10-10 NOTE — Patient Instructions (Signed)
Keep log of calorie intake (food Log) 1 can of ensure per day in addition to meals.  Record your weight daily Reduce lasix 40 mg in am 20 mg in pm  If you develop shortness of breath or increase of swelling in lower legs go back to lasix 40 mg twice daily.  A echo has been ordered to look at your heart function  Go to lab now for metabolic profile. Follow up appointment in 2 weeks.wtih NP Call clinic with question or concerns or if symptoms worsen  Heart Failure Heart failure is a condition in which the heart has trouble pumping blood. This means your heart does not pump blood efficiently for your body to work well. In some cases of heart failure, fluid may back up into your lungs or you may have swelling (edema) in your lower legs. Heart failure is usually a long-term (chronic) condition. It is important for you to take good care of yourself and follow your health care provider's treatment plan. CAUSES  Some health conditions can cause heart failure. Those health conditions include:  High blood pressure (hypertension). Hypertension causes the heart muscle to work harder than normal. When pressure in the blood vessels is high, the heart needs to pump (contract) with more force in order to circulate blood throughout the body. High blood pressure eventually causes the heart to become stiff and weak.  Coronary artery disease (CAD). CAD is the buildup of cholesterol and fat (plaque) in the arteries of the heart. The blockage in the arteries deprives the heart muscle of oxygen and blood. This can cause chest pain and may lead to a heart attack. High blood pressure can also contribute to CAD.  Heart attack (myocardial infarction). A heart attack occurs when one or more arteries in the heart become blocked. The loss of oxygen damages the muscle tissue of the heart. When this happens, part of the heart muscle dies. The injured tissue does not contract as well and weakens the heart's ability to pump  blood.  Abnormal heart valves. When the heart valves do not open and close properly, it can cause heart failure. This makes the heart muscle pump harder to keep the blood flowing.  Heart muscle disease (cardiomyopathy or myocarditis). Heart muscle disease is damage to the heart muscle from a variety of causes. These can include drug or alcohol abuse, infections, or unknown reasons. These can increase the risk of heart failure.  Lung disease. Lung disease makes the heart work harder because the lungs do not work properly. This can cause a strain on the heart, leading it to fail.  Diabetes. Diabetes increases the risk of heart failure. High blood sugar contributes to high fat (lipid) levels in the blood. Diabetes can also cause slow damage to tiny blood vessels that carry important nutrients to the heart muscle. When the heart does not get enough oxygen and food, it can cause the heart to become weak and stiff. This leads to a heart that does not contract efficiently.  Other conditions can contribute to heart failure. These include abnormal heart rhythms, thyroid problems, and low blood counts (anemia). Certain unhealthy behaviors can increase the risk of heart failure, including:  Being overweight.  Smoking or chewing tobacco.  Eating foods high in fat and cholesterol.  Abusing illicit drugs or alcohol.  Lacking physical activity. SYMPTOMS  Heart failure symptoms may vary and can be hard to detect. Symptoms may include:  Shortness of breath with activity, such as climbing stairs.  Persistent  cough.  Swelling of the feet, ankles, legs, or abdomen.  Unexplained weight gain.  Difficulty breathing when lying flat (orthopnea).  Waking from sleep because of the need to sit up and get more air.  Rapid heartbeat.  Fatigue and loss of energy.  Feeling light-headed, dizzy, or close to fainting.  Loss of appetite.  Nausea.  Increased urination during the night  (nocturia). DIAGNOSIS  A diagnosis of heart failure is based on your history, symptoms, physical examination, and diagnostic tests. Diagnostic tests for heart failure may include:  Echocardiography.  Electrocardiography.  Chest X-ray.  Blood tests.  Exercise stress test.  Cardiac angiography.  Radionuclide scans. TREATMENT  Treatment is aimed at managing the symptoms of heart failure. Medicines, behavioral changes, or surgical intervention may be necessary to treat heart failure.  Medicines to help treat heart failure may include:  Angiotensin-converting enzyme (ACE) inhibitors. This type of medicine blocks the effects of a blood protein called angiotensin-converting enzyme. ACE inhibitors relax (dilate) the blood vessels and help lower blood pressure.  Angiotensin receptor blockers (ARBs). This type of medicine blocks the actions of a blood protein called angiotensin. Angiotensin receptor blockers dilate the blood vessels and help lower blood pressure.  Water pills (diuretics). Diuretics cause the kidneys to remove salt and water from the blood. The extra fluid is removed through urination. This loss of extra fluid lowers the volume of blood the heart pumps.  Beta blockers. These prevent the heart from beating too fast and improve heart muscle strength.  Digitalis. This increases the force of the heartbeat.  Healthy behavior changes include:  Obtaining and maintaining a healthy weight.  Stopping smoking or chewing tobacco.  Eating heart-healthy foods.  Limiting or avoiding alcohol.  Stopping illicit drug use.  Physical activity as directed by your health care provider.  Surgical treatment for heart failure may include:  A procedure to open blocked arteries, repair damaged heart valves, or remove damaged heart muscle tissue.  A pacemaker to improve heart muscle function and control certain abnormal heart rhythms.  An internal cardioverter defibrillator to treat  certain serious abnormal heart rhythms.  A left ventricular assist device (LVAD) to assist the pumping ability of the heart. HOME CARE INSTRUCTIONS   Take medicines only as directed by your health care provider. Medicines are important in reducing the workload of your heart, slowing the progression of heart failure, and improving your symptoms.  Do not stop taking your medicine unless directed by your health care provider.  Do not skip any dose of medicine.  Refill your prescriptions before you run out of medicine. Your medicines are needed every day.  Engage in moderate physical activity if directed by your health care provider. Moderate physical activity can benefit some people. The elderly and people with severe heart failure should consult with a health care provider for physical activity recommendations.  Eat heart-healthy foods. Food choices should be free of trans fat and low in saturated fat, cholesterol, and salt (sodium). Healthy choices include fresh or frozen fruits and vegetables, fish, lean meats, legumes, fat-free or low-fat dairy products, and whole grain or high fiber foods. Talk to a dietitian to learn more about heart-healthy foods.  Limit sodium if directed by your health care provider. Sodium restriction may reduce symptoms of heart failure in some people. Talk to a dietitian to learn more about heart-healthy seasonings.  Use healthy cooking methods. Healthy cooking methods include roasting, grilling, broiling, baking, poaching, steaming, or stir-frying. Talk to a dietitian to learn more  about healthy cooking methods.  Limit fluids if directed by your health care provider. Fluid restriction may reduce symptoms of heart failure in some people.  Weigh yourself every day. Daily weights are important in the early recognition of excess fluid. You should weigh yourself every morning after you urinate and before you eat breakfast. Wear the same amount of clothing each time you  weigh yourself. Record your daily weight. Provide your health care provider with your weight record.  Monitor and record your blood pressure if directed by your health care provider.  Check your pulse if directed by your health care provider.  Lose weight if directed by your health care provider. Weight loss may reduce symptoms of heart failure in some people.  Stop smoking or chewing tobacco. Nicotine makes your heart work harder by causing your blood vessels to constrict. Do not use nicotine gum or patches before talking to your health care provider.  Keep all follow-up visits as directed by your health care provider. This is important.  Limit alcohol intake to no more than 1 drink per day for nonpregnant women and 2 drinks per day for men. One drink equals 12 ounces of beer, 5 ounces of wine, or 1 ounces of hard liquor. Drinking more than that is harmful to your heart. Tell your health care provider if you drink alcohol several times a week. Talk with your health care provider about whether alcohol is safe for you. If your heart has already been damaged by alcohol or you have severe heart failure, drinking alcohol should be stopped completely.  Stop illicit drug use.  Stay up-to-date with immunizations. It is especially important to prevent respiratory infections through current pneumococcal and influenza immunizations.  Manage other health conditions such as hypertension, diabetes, thyroid disease, or abnormal heart rhythms as directed by your health care provider.  Learn to manage stress.  Plan rest periods when fatigued.  Learn strategies to manage high temperatures. If the weather is extremely hot:  Avoid vigorous physical activity.  Use air conditioning or fans or seek a cooler location.  Avoid caffeine and alcohol.  Wear loose-fitting, lightweight, and light-colored clothing.  Learn strategies to manage cold temperatures. If the weather is extremely cold:  Avoid vigorous  physical activity.  Layer clothes.  Wear mittens or gloves, a hat, and a scarf when going outside.  Avoid alcohol.  Obtain ongoing education and support as needed.  Participate in or seek rehabilitation as needed to maintain or improve independence and quality of life. SEEK MEDICAL CARE IF:   Your weight increases by 03 lb/1.4 kg in 1 day or 05 lb/2.3 kg in a week.  You have increasing shortness of breath that is unusual for you.  You are unable to participate in your usual physical activities.  You tire easily.  You cough more than normal, especially with physical activity.  You have any or more swelling in areas such as your hands, feet, ankles, or abdomen.  You are unable to sleep because it is hard to breathe.  You feel like your heart is beating fast (palpitations).  You become dizzy or light-headed upon standing up. SEEK IMMEDIATE MEDICAL CARE IF:   You have difficulty breathing.  There is a change in mental status such as decreased alertness or difficulty with concentration.  You have a pain or discomfort in your chest.  You have an episode of fainting (syncope). MAKE SURE YOU:   Understand these instructions.  Will watch your condition.  Will get help  right away if you are not doing well or get worse. Document Released: 01/26/2005 Document Revised: 06/12/2013 Document Reviewed: 02/26/2012 [facility] Patient Information 2015 [place], [place]. This information is not intended to replace advice given to you by your health care provider. Make sure you discuss any questions you have with your health care provider.

## 2013-10-10 NOTE — Progress Notes (Signed)
Subjective:    Patient ID: Luis Glenn, male    DOB: 08/10/1927, 80 y.o.   MRN: [id number]  HPI  Patient is seen in office today for concern regarding continued weight loss.  Patient states he has not changed his(regular) diet and is eating as he always has, 3 times daily. He denies loss of appetite.  He has noticed some increased abdominal "gurggling". Denies abdominal pain, nausea or vomiting.  BM's 1-2 time daily, normal no change in color,  Reports good diuresis from Lasix 40 mg bid. Denies dysuria or hematuria. Denies chest pain, pressure, or palpitations.  Some shortness of breath with walking. Has not increased activity.  He has struggled with anxiety/depression since loss of his wife but maintains he is eating the same amount of food.   Medication and labs reviewed.    Review of Systems Past Medical History  Diagnosis Date  . GOUT   . PERIPHERAL VASCULAR DISEASE   . DIVERTICULOSIS, COLON   . BENIGN PROSTATIC HYPERTROPHY   . DEPRESSION   . DIABETES MELLITUS, TYPE II   . GERD   . HYPERLIPIDEMIA   . HYPERTENSION   . CATARACTS   . OSTEOARTHRITIS   . ANXIETY   . ALLERGIC RHINITIS     History   Social History  . Marital Status: Married    Spouse Name: N/A    Number of Children: N/A  . Years of Education: N/A   Occupational History  . Retired    Social History Main Topics  . Smoking status: Former Smoker -- 3.00 packs/day for 40 years    Types: Cigarettes    Quit date: 02/09/1989  . Smokeless tobacco: Not on file  . Alcohol Use: Yes     Comment: occassional  . Drug Use: No  . Sexual Activity: Not on file   Other Topics Concern  . Not on file   Social History Narrative   Widowed - wife [name] passed 07/2013    Past Surgical History  Procedure Laterality Date  . Tonsillectomy  10/1942    Family History  Problem Relation Age of Onset  . Diabetes Mother   . Hypertension Mother   . Diabetes Father   . Hypertension Father   . Alcohol abuse Other   .  Breast cancer Other     Allergies  Allergen Reactions  . Penicillins     Current Outpatient Prescriptions on File Prior to Visit  Medication Sig Dispense Refill  . acetaminophen (TYLENOL) 500 MG tablet Take 500 mg by mouth at bedtime.        Marland Kitchen allopurinol (ZYLOPRIM) 300 MG tablet TAKE ONE TABLET BY MOUTH ONE TIME DAILY   90 tablet  2  . ALPRAZolam (XANAX) 0.5 MG tablet Take 1 tablet (0.5 mg total) by mouth 3 (three) times daily as needed for anxiety or sleep.  90 tablet  3  . amLODipine-benazepril (LOTREL) 10-20 MG per capsule TAKE ONE CAPSULE BY MOUTH ONE TIME DAILY   90 capsule  0  . fluticasone (FLONASE) 50 MCG/ACT nasal spray Place 2 sprays into both nostrils daily.  16 g  6  . furosemide (LASIX) 40 MG tablet TAKE ONE TABLET BY MOUTH TWICE DAILY   60 tablet  5  . Multiple Vitamin (MULTIVITAMIN) tablet Take 1 tablet by mouth daily.        . naproxen sodium (ANAPROX) 220 MG tablet Take 220 mg by mouth as needed.       Marland Kitchen oxyCODONE (OXY IR/ROXICODONE) 5  MG immediate release tablet Take 1 tablet (5 mg total) by mouth every 6 (six) hours as needed for severe pain.  60 tablet  0  . potassium chloride SA (K-DUR,KLOR-CON) 20 MEQ tablet TAKE ONE TABLET BY MOUTH ONE TIME DAILY   30 tablet  5  . sertraline (ZOLOFT) 100 MG tablet Take one tablet by mouth one time daily  30 tablet  11  . simvastatin (ZOCOR) 40 MG tablet TAKE ONE TABLET BY MOUTH NIGHTLY AT BEDTIME   90 tablet  1  . tamsulosin (FLOMAX) 0.4 MG CAPS capsule TAKE ONE CAPSULE BY MOUTH ONE TIME DAILY   30 capsule  5  . terazosin (HYTRIN) 10 MG capsule TAKE ONE CAPSULE BY MOUTH ONE TIME DAILY   90 capsule  1   No current facility-administered medications on file prior to visit.    BP 142/58  Pulse 72  Temp(Src) 97.4 F (36.3 C) (Oral)  Ht '6\' 2"'  (1.88 m)  Wt 188 lb (85.276 kg)  BMI 24.13 kg/m2  SpO2 98%  Weight 02/2013 207 lb, 03/2013 206 lb, 09/2013 198 lb,  10/2013 188 lb        Objective:   Physical Exam  Constitutional: He  is oriented to person, place, and time. He appears well-developed. No distress.  Weight down 19 lbs since 02/2013, and down 10lb since 09/14/2013.  HENT:  Head: Normocephalic.  Mouth/Throat: Oropharynx is clear and moist. No oropharyngeal exudate.  Eyes: Pupils are equal, round, and reactive to light.  Neck: Neck supple. No tracheal deviation present. No thyromegaly present.  Cardiovascular: Normal rate.   Rhythm irregular  Pulmonary/Chest: Effort normal and breath sounds normal. No respiratory distress. He has no wheezes. He has no rales.  Abdominal: Soft. Bowel sounds are normal. He exhibits no distension and no mass. There is no tenderness. There is no guarding.  Musculoskeletal: He exhibits edema. He exhibits no tenderness.  Lower legs remain edematous plus 3 pitting edema with associated scaling of skin. No weeping or open areas noted. Patient not wearing compression stocking today.   Neurological: He is alert and oriented to person, place, and time.  Skin: Skin is warm and dry. There is pallor.  Psychiatric: He has a normal mood and affect. His behavior is normal. Thought content normal.          Assessment & Plan:  1. Abnormal weight loss Observe. Patient instructed to record his weight daily. Keep a food log daily and bring into net appointment. Eat a well balanced increased calorie diet, Ad can of Ensure to supplement meals. - Comp Met (CMET); Future  2. DIABETES MELLITUS, TYPE II Eat a regular increased calorie diet   3. Chronic congestive heart failure, unspecified congestive heart failure type Evaluate Heart function.  09/18/2013 BUN 46 Instructed patient  To decrease Lasix to 40 mg in am and 20 mg po in PM.  If patient develops increase in edema or shortness of breath go back to 40 mg  Bid. Maintain low Na diet.  - 2D Echocardiogram without contrast; Future Follow up with NP in 2 weeks for re evaluation of weight. Fu with Dr. Asa Lente as scheduled Call clinic with questions  or concerns Attempted to give patient  Influenza vaccine today. He reports he already had vaccine when being seen by Dr. Tamala Julian recently.  Will verify with Dr. Tamala Julian

## 2013-10-10 NOTE — Progress Notes (Signed)
Pre visit review using our clinic review tool, if applicable. No additional management support is needed unless otherwise documented below in the visit note. 

## 2013-10-10 NOTE — Assessment & Plan Note (Signed)
Patient eating a regular diet.  Patient will keep a calorie log and bring in in 2 weeks on follow up visit.  Discussed with patient to eat meals and add 1 can of ensure per day.

## 2013-10-12 ENCOUNTER — Ambulatory Visit (HOSPITAL_COMMUNITY): Payer: Medicare Other | Attending: Nurse Practitioner | Admitting: Radiology

## 2013-10-12 ENCOUNTER — Telehealth: Payer: Self-pay | Admitting: Internal Medicine

## 2013-10-12 DIAGNOSIS — R609 Edema, unspecified: Secondary | ICD-10-CM

## 2013-10-12 DIAGNOSIS — I4891 Unspecified atrial fibrillation: Secondary | ICD-10-CM

## 2013-10-12 DIAGNOSIS — I509 Heart failure, unspecified: Secondary | ICD-10-CM | POA: Diagnosis not present

## 2013-10-12 DIAGNOSIS — I3139 Other pericardial effusion (noninflammatory): Secondary | ICD-10-CM

## 2013-10-12 DIAGNOSIS — I313 Pericardial effusion (noninflammatory): Secondary | ICD-10-CM

## 2013-10-12 NOTE — Telephone Encounter (Signed)
Sept 1 note per NP reviewed  Phoned per Echo regarding finding of mod to large pericardial effusion - sept 3,2015  Will need ROV for tomorrow - robin to contact pt

## 2013-10-12 NOTE — Telephone Encounter (Addendum)
Ok, I will refer urgent to cardiology - done per emr, pt should hopefully hear from [facility] soon  Note to [facility] as well

## 2013-10-12 NOTE — Telephone Encounter (Signed)
Sept 1 note reviewed per NP  Echo today with mod to large effusion  Needs ROV tomorrow

## 2013-10-12 NOTE — Progress Notes (Signed)
Echocardiogram performed.  

## 2013-10-12 NOTE — Telephone Encounter (Signed)
Called the patient informed of results and the need for an appointment.  The patient stated if he is in need of being seen tomorrow, meaning if the problem is that serious he would rather see a Cardiologist.

## 2013-10-12 NOTE — Addendum Note (Signed)
Addended by: Biagio Borg on: 10/12/2013 07:29 PM   Modules accepted: Orders

## 2013-10-13 ENCOUNTER — Telehealth: Payer: Self-pay | Admitting: Internal Medicine

## 2013-10-13 NOTE — Telephone Encounter (Signed)
Dr Jenny Reichmann spoke with Alyse Low at [facility] said that they are scheduling into October .You will need to call over and speak with the DOD to get pt in sooner. There number is [phone]

## 2013-10-18 ENCOUNTER — Ambulatory Visit (INDEPENDENT_AMBULATORY_CARE_PROVIDER_SITE_OTHER): Payer: Medicare Other | Admitting: Cardiovascular Disease

## 2013-10-18 ENCOUNTER — Ambulatory Visit: Payer: Medicare Other | Admitting: Family Medicine

## 2013-10-18 ENCOUNTER — Encounter: Payer: Self-pay | Admitting: Cardiovascular Disease

## 2013-10-18 VITALS — BP 114/56 | HR 61 | Ht 74.0 in | Wt 188.0 lb

## 2013-10-18 DIAGNOSIS — I319 Disease of pericardium, unspecified: Secondary | ICD-10-CM | POA: Diagnosis not present

## 2013-10-18 DIAGNOSIS — I3139 Other pericardial effusion (noninflammatory): Secondary | ICD-10-CM | POA: Insufficient documentation

## 2013-10-18 DIAGNOSIS — I313 Pericardial effusion (noninflammatory): Secondary | ICD-10-CM | POA: Insufficient documentation

## 2013-10-18 MED ORDER — COLCHICINE 0.6 MG PO TABS: 0.6000 mg | ORAL_TABLET | Freq: Two times a day (BID) | ORAL | Status: DC

## 2013-10-18 NOTE — Assessment & Plan Note (Signed)
Luis Glenn was found to have a large pericardial effusion by echo. In reviewing his chest x-ray from November, 2014, and he may have had a pericardial effusion at that time as well. He's not had any episodes of chest pain and no signs or symptoms of pericarditis.  Has chronic leg edema. He admits to not eating very well. He typically eats frozen dinners and prepared foods. Im Sure that he's getting lots of salt. He may also not be getting enough protein.  Get a repeat echocardiogram in one month. I'll see him several weeks after that period of his pericardial effusion will have improved. I've encouraged him to look for lower sodium dinners. Unfortunately, he does not cook at all. Also asked him to increase his protein intake.

## 2013-10-18 NOTE — Progress Notes (Signed)
Luis Glenn Date of Birth  Feb 13, 1927       [facility]    [employer] [address], [place], [place]  [address]   [place],   [address] [place]   Fax  [phone]     Fax [phone]  Problem List: 1. Pericardial effusion 2. Gout 3. Hypertension 4. Atrial fibrillation 5. Hyperlipidemia 6. Moderate pulmonary hypertension 7. Moderate aortic insufficiency 8. Weight loss   History of Present Illness:  Luis Glenn is an 80 y.o. gentleman who is referred to [place] today for further evaluation and management of a pericardial effusion.  Echo:  Left ventricle: E/e&'>22.2 suggestive of elevated LV filling pressures. The cavity size was normal. There was moderate concentric hypertrophy. Systolic function was normal. The estimated ejection fraction was in the range of 55% to 60%. Wall motion was normal; there were no regional wall motion abnormalities. - Aortic valve: Trileaflet; normal thickness, mildly calcified leaflets. There was moderate regurgitation. - Aorta: Ascending aortic diameter: 39 mm (S). - Ascending aorta: The ascending aorta was mildly dilated. - Mitral valve: Mean gradient (D): 4 mm Hg. Peak gradient (D): 11 mm Hg. Valve area by pressure half-time: 2.2 cm^2. Valve area by continuity equation (using LVOT flow): 3.38 cm^2. - Left atrium: The atrium was severely dilated. - Right ventricle: The cavity size was mildly dilated. Wall thickness was normal. - Tricuspid valve: There was mild regurgitation. - Pulmonic valve: There was mild regurgitation. - Pulmonary arteries: PA peak pressure: 53 mm Hg (S). - Pericardium, extracardiac: The pericardial measures 4.31cm at its largest diameter. A moderate to large, free-flowing pericardial effusion was identified circumferential to the heart. The fluid had no internal echoes.There was no evidence of hemodynamic compromise.   He has lost  weight over the past several years and has lost 10-20 lbs over the past 2 months.   No hx of cancer.   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  Worked for [employer].    Does not get any regular exercise.  He took care of his ill wife for the past 4 years - she died this past June of Lung Cancer . He has a hx of smoking - quit 25 year ago.    He has had chronic severe leg edema for years- always better in the morning.  Better with lasix.   Has not been eating well -  He had a CXR in Nov. 2014 which showed cardiomegaly - a pericardial effusion certainly could have been present.   He typically eats prepared food - frozen dinners / microwavable dinners.    Current Outpatient Prescriptions on File Prior to Visit  Medication Sig Dispense Refill  . acetaminophen (TYLENOL) 500 MG tablet Take 500 mg by mouth at bedtime.        Marland Kitchen allopurinol (ZYLOPRIM) 300 MG tablet TAKE ONE TABLET BY MOUTH ONE TIME DAILY   90 tablet  2  . ALPRAZolam (XANAX) 0.5 MG tablet Take 1 tablet (0.5 mg total) by mouth 3 (three) times daily as needed for anxiety or sleep.  90 tablet  3  . amLODipine-benazepril (LOTREL) 10-20 MG per capsule TAKE ONE CAPSULE BY MOUTH ONE TIME DAILY   90 capsule  0  . fluticasone (FLONASE) 50 MCG/ACT nasal spray Place 2 sprays into both nostrils daily.  16 g  6  . furosemide (LASIX) 40 MG tablet TAKE ONE TABLET  BY MOUTH TWICE DAILY   60 tablet  5  . Multiple Vitamin (MULTIVITAMIN) tablet Take 1 tablet by mouth daily.        . naproxen sodium (ANAPROX) 220 MG tablet Take 220 mg by mouth as needed.       Marland Kitchen oxyCODONE (OXY IR/ROXICODONE) 5 MG immediate release tablet Take 1 tablet (5 mg total) by mouth every 6 (six) hours as needed for severe pain.  60 tablet  0  . potassium chloride SA (K-DUR,KLOR-CON) 20 MEQ tablet TAKE ONE TABLET BY MOUTH ONE TIME DAILY   30 tablet  5  . sertraline (ZOLOFT) 100 MG tablet Take one tablet by mouth one time daily  30 tablet  11  . simvastatin (ZOCOR)  40 MG tablet TAKE ONE TABLET BY MOUTH NIGHTLY AT BEDTIME   90 tablet  1  . tamsulosin (FLOMAX) 0.4 MG CAPS capsule TAKE ONE CAPSULE BY MOUTH ONE TIME DAILY   30 capsule  5  . terazosin (HYTRIN) 10 MG capsule TAKE ONE CAPSULE BY MOUTH ONE TIME DAILY   90 capsule  1   No current facility-administered medications on file prior to visit.    Allergies  Allergen Reactions  . Penicillins     Past Medical History  Diagnosis Date  . GOUT   . PERIPHERAL VASCULAR DISEASE   . DIVERTICULOSIS, COLON   . BENIGN PROSTATIC HYPERTROPHY   . DEPRESSION   . DIABETES MELLITUS, TYPE II   . GERD   . HYPERLIPIDEMIA   . HYPERTENSION   . CATARACTS   . OSTEOARTHRITIS   . ANXIETY   . ALLERGIC RHINITIS     Past Surgical History  Procedure Laterality Date  . Tonsillectomy  10/1942    History  Smoking status  . Former Smoker -- 3.00 packs/day for 40 years  . Types: Cigarettes  . Quit date: 02/09/1989  Smokeless tobacco  . Not on file    History  Alcohol Use  . Yes    Comment: occassional    Family History  Problem Relation Age of Onset  . Diabetes Mother   . Hypertension Mother   . Diabetes Father   . Hypertension Father   . Alcohol abuse Other   . Breast cancer Other     Reviw of Systems:  Reviewed in the HPI.  All other systems are negative.  Physical Exam: Blood pressure 114/56, pulse 61, height 6\' 2"  (1.88 m), weight 188 lb (85.276 kg). Wt Readings from Last 3 Encounters:  10/18/13 188 lb (85.276 kg)  10/10/13 188 lb (85.276 kg)  09/29/13 195 lb (88.451 kg)     General: Elderly gentleman, appears chronicaly ill.    Head: Normocephalic, atraumatic, sclera non-icteric, mucus membranes are moist,   Neck: Supple. Carotids are 2 + without bruits. No JVD   Lungs: Clear   Heart: irregularly irregular,  Soft systolic murmur.  Soft diastolic murmur  Abdomen: Soft, non-tender, non-distended with normal bowel sounds.  Msk:  Strength and tone are normal   Extremities:  chronic statis changes, scaley skin,  3-4+ pitting edema  Neuro: CN II - XII intact.  Alert and oriented X 3.   Psych:  Normal   ECG: Sept. 9, 2015:  Atrial fibrillation with occasional premature ventricular complex. His heart rate is 61  Assessment / Plan:

## 2013-10-18 NOTE — Assessment & Plan Note (Signed)
He has atrial fibrillation that was actually diagnosed several years ago. He has seen Dr. Angelena Form in the past but there are no notes in the record.   At this point I do not think that he needs any rate controlling drugs his heart rate is well controlled. I do not want sort of anticoagulant at this point since he may need urgent care pericardiocentesis.

## 2013-10-18 NOTE — Patient Instructions (Signed)
Your physician has recommended you make the following change in your medication:  START Colchicine 0.6 mg twice daily  Your physician has requested that you have an echocardiogram in 1 month. Echocardiography is a painless test that uses sound waves to create images of your heart. It provides your doctor with information about the size and shape of your heart and how well your heart's chambers and valves are working. This procedure takes approximately one hour. There are no restrictions for this procedure.  Your physician recommends that you schedule a follow-up appointment in: 6 weeks with Dr. Acie Fredrickson.

## 2013-10-19 ENCOUNTER — Encounter: Payer: Self-pay | Admitting: Internal Medicine

## 2013-10-24 ENCOUNTER — Ambulatory Visit (INDEPENDENT_AMBULATORY_CARE_PROVIDER_SITE_OTHER): Payer: Medicare Other | Admitting: Nurse Practitioner

## 2013-10-24 ENCOUNTER — Telehealth: Payer: Self-pay | Admitting: Internal Medicine

## 2013-10-24 ENCOUNTER — Encounter: Payer: Self-pay | Admitting: Nurse Practitioner

## 2013-10-24 VITALS — BP 130/52 | HR 63 | Temp 97.8°F | Ht 74.0 in | Wt 188.0 lb

## 2013-10-24 DIAGNOSIS — I319 Disease of pericardium, unspecified: Secondary | ICD-10-CM

## 2013-10-24 DIAGNOSIS — R634 Abnormal weight loss: Secondary | ICD-10-CM

## 2013-10-24 DIAGNOSIS — K589 Irritable bowel syndrome without diarrhea: Secondary | ICD-10-CM | POA: Diagnosis not present

## 2013-10-24 DIAGNOSIS — I313 Pericardial effusion (noninflammatory): Secondary | ICD-10-CM

## 2013-10-24 DIAGNOSIS — I3139 Other pericardial effusion (noninflammatory): Secondary | ICD-10-CM

## 2013-10-24 NOTE — Assessment & Plan Note (Signed)
Patient follow by Dr Acie Fredrickson.  Scheduled to have repeat echo in 1 month.

## 2013-10-24 NOTE — Assessment & Plan Note (Signed)
Patient denies diarrhea.

## 2013-10-24 NOTE — Progress Notes (Signed)
Pre visit review using our clinic review tool, if applicable. No additional management support is needed unless otherwise documented below in the visit note. 

## 2013-10-24 NOTE — Progress Notes (Signed)
Subjective:    Patient ID: Luis Glenn, male    DOB: November 22, 1927, 80 y.o.   MRN: [id number]  HPI  Patient is seen for follow up regarding abnormal weight loss.  He was seen by Dr. Acie Fredrickson For evaluation of pericardial effusion.  Patient denies chest pain, pressure or palpitations, no shortness of breath or cough.  He has been documenting weight and calorie intake.  Weight 188 lbs unchanged.  Calorie intake 1800-2300 daily.     Review of Systems  Constitutional: Positive for fatigue. Negative for fever and chills.       Weight stable  HENT: Negative.   Respiratory: Negative.  Negative for cough, chest tightness, shortness of breath and wheezing.   Cardiovascular: Positive for leg swelling. Negative for chest pain and palpitations.  Skin: Negative.     Past Medical History  Diagnosis Date  . GOUT   . PERIPHERAL VASCULAR DISEASE   . DIVERTICULOSIS, COLON   . BENIGN PROSTATIC HYPERTROPHY   . DEPRESSION   . DIABETES MELLITUS, TYPE II   . GERD   . HYPERLIPIDEMIA   . HYPERTENSION   . CATARACTS   . OSTEOARTHRITIS   . ANXIETY   . ALLERGIC RHINITIS     History   Social History  . Marital Status: Married    Spouse Name: N/A    Number of Children: N/A  . Years of Education: N/A   Occupational History  . Retired    Social History Main Topics  . Smoking status: Former Smoker -- 3.00 packs/day for 40 years    Types: Cigarettes    Quit date: 02/09/1989  . Smokeless tobacco: Not on file  . Alcohol Use: Yes     Comment: occassional  . Drug Use: No  . Sexual Activity: Not on file   Other Topics Concern  . Not on file   Social History Narrative   Widowed - wife [name] passed 07/2013    Past Surgical History  Procedure Laterality Date  . Tonsillectomy  10/1942    Family History  Problem Relation Age of Onset  . Diabetes Mother   . Hypertension Mother   . Diabetes Father   . Hypertension Father   . Alcohol abuse Other   . Breast cancer Other     Allergies    Allergen Reactions  . Penicillins     Current Outpatient Prescriptions on File Prior to Visit  Medication Sig Dispense Refill  . acetaminophen (TYLENOL) 500 MG tablet Take 500 mg by mouth at bedtime.        . ALPRAZolam (XANAX) 0.5 MG tablet Take 1 tablet (0.5 mg total) by mouth 3 (three) times daily as needed for anxiety or sleep.  90 tablet  3  . amLODipine-benazepril (LOTREL) 10-20 MG per capsule TAKE ONE CAPSULE BY MOUTH ONE TIME DAILY   90 capsule  0  . colchicine 0.6 MG tablet Take 1 tablet (0.6 mg total) by mouth 2 (two) times daily.  61 tablet  11  . fluticasone (FLONASE) 50 MCG/ACT nasal spray Place 2 sprays into both nostrils daily.  16 g  6  . furosemide (LASIX) 40 MG tablet TAKE ONE TABLET BY MOUTH TWICE DAILY   60 tablet  5  . oxyCODONE (OXY IR/ROXICODONE) 5 MG immediate release tablet Take 1 tablet (5 mg total) by mouth every 6 (six) hours as needed for severe pain.  60 tablet  0  . potassium chloride SA (K-DUR,KLOR-CON) 20 MEQ tablet TAKE ONE TABLET BY MOUTH  ONE TIME DAILY   30 tablet  5  . sertraline (ZOLOFT) 100 MG tablet Take one tablet by mouth one time daily  30 tablet  11  . simvastatin (ZOCOR) 40 MG tablet TAKE ONE TABLET BY MOUTH NIGHTLY AT BEDTIME   90 tablet  1  . tamsulosin (FLOMAX) 0.4 MG CAPS capsule TAKE ONE CAPSULE BY MOUTH ONE TIME DAILY   30 capsule  5  . terazosin (HYTRIN) 10 MG capsule TAKE ONE CAPSULE BY MOUTH ONE TIME DAILY   90 capsule  1  . Multiple Vitamin (MULTIVITAMIN) tablet Take 1 tablet by mouth daily.        . naproxen sodium (ANAPROX) 220 MG tablet Take 220 mg by mouth as needed.        No current facility-administered medications on file prior to visit.    BP 130/52  Pulse 63  Temp(Src) 97.8 F (36.6 C) (Oral)  Ht 6\' 2"  (1.88 m)  Wt 188 lb (85.276 kg)  BMI 24.13 kg/m2  SpO2 98%        Objective:   Physical Exam  Constitutional:  Appears frail.  HENT:  Head: Normocephalic.  Cardiovascular: Normal rate and regular rhythm.    Pulmonary/Chest: Effort normal and breath sounds normal. He has no wheezes. He has no rales.  Diminished in bases bilaterally  Skin: Skin is warm and dry.  Psychiatric: He has a normal mood and affect.          Assessment & Plan:  1. IBS (irritable bowel syndrome) Observe. Avoid aggravating factors.     2. Abnormal weight loss Continue current weight and calorie intake log and bring in to next appointment.  Patient to add 1 can of ensure to 3 daily meals. 2200-2500 calorie diet.     3. Pericardial effusion Follow up with Dr. Acie Fredrickson as scheduled. Call clinic with questions or concerns.

## 2013-10-24 NOTE — Telephone Encounter (Signed)
Luis Glenn wanted patient to follow up with Dr. Asa Lente in 4 weeks.  Can we double book her?

## 2013-10-24 NOTE — Patient Instructions (Signed)
Keep food log 2000-2500 calorie diet Daily weight.  Follow up with Dr. Asa Lente in 4 weeks.  Call clinic with questions or concerns.   Calorie Counting for Weight Loss Calories are energy you get from the things you eat and drink. Your body uses this energy to keep you going throughout the day. The number of calories you eat affects your weight. When you eat more calories than your body needs, your body stores the extra calories as fat. When you eat fewer calories than your body needs, your body burns fat to get the energy it needs. Calorie counting means keeping track of how many calories you eat and drink each day. If you make sure to eat fewer calories than your body needs, you should lose weight. In order for calorie counting to work, you will need to eat the number of calories that are right for you in a day to lose a healthy amount of weight per week. A healthy amount of weight to lose per week is usually 1-2 lb (0.5-0.9 kg). A dietitian can determine how many calories you need in a day and give you suggestions on how to reach your calorie goal.  WHAT IS MY MY PLAN? My goal is to have __________ calories per day.  If I have this many calories per day, I should lose around __________ pounds per week. WHAT DO I NEED TO KNOW ABOUT CALORIE COUNTING? In order to meet your daily calorie goal, you will need to:  Find out how many calories are in each food you would like to eat. Try to do this before you eat.  Decide how much of the food you can eat.  Write down what you ate and how many calories it had. Doing this is called keeping a food log. WHERE DO I FIND CALORIE INFORMATION? The number of calories in a food can be found on a Nutrition Facts label. Note that all the information on a label is based on a specific serving of the food. If a food does not have a Nutrition Facts label, try to look up the calories online or ask your dietitian for help. HOW DO I DECIDE HOW MUCH TO EAT? To decide how  much of the food you can eat, you will need to consider both the number of calories in one serving and the size of one serving. This information can be found on the Nutrition Facts label. If a food does not have a Nutrition Facts label, look up the information online or ask your dietitian for help. Remember that calories are listed per serving. If you choose to have more than one serving of a food, you will have to multiply the calories per serving by the amount of servings you plan to eat. For example, the label on a package of bread might say that a serving size is 1 slice and that there are 90 calories in a serving. If you eat 1 slice, you will have eaten 90 calories. If you eat 2 slices, you will have eaten 180 calories. HOW DO I KEEP A FOOD LOG? After each meal, record the following information in your food log:  What you ate.  How much of it you ate.  How many calories it had.  Then, add up your calories. Keep your food log near you, such as in a small notebook in your pocket. Another option is to use a mobile app or website. Some programs will calculate calories for you and show you how  many calories you have left each time you add an item to the log. WHAT ARE SOME CALORIE COUNTING TIPS?  Use your calories on foods and drinks that will fill you up and not leave you hungry. Some examples of this include foods like nuts and nut butters, vegetables, lean proteins, and high-fiber foods (more than 5 g fiber per serving).  Eat nutritious foods and avoid empty calories. Empty calories are calories you get from foods or beverages that do not have many nutrients, such as candy and soda. It is better to have a nutritious high-calorie food (such as an avocado) than a food with few nutrients (such as a bag of chips).  Know how many calories are in the foods you eat most often. This way, you do not have to look up how many calories they have each time you eat them.  Look out for foods that may seem  like low-calorie foods but are really high-calorie foods, such as baked goods, soda, and fat-free candy.  Pay attention to calories in drinks. Drinks such as sodas, specialty coffee drinks, alcohol, and juices have a lot of calories yet do not fill you up. Choose low-calorie drinks like water and diet drinks.  Focus your calorie counting efforts on higher calorie items. Logging the calories in a garden salad that contains only vegetables is less important than calculating the calories in a milk shake.  Find a way of tracking calories that works for you. Get creative. Most people who are successful find ways to keep track of how much they eat in a day, even if they do not count every calorie. WHAT ARE SOME PORTION CONTROL TIPS?  Know how many calories are in a serving. This will help you know how many servings of a certain food you can have.  Use a measuring cup to measure serving sizes. This is helpful when you start out. With time, you will be able to estimate serving sizes for some foods.  Take some time to put servings of different foods on your favorite plates, bowls, and cups so you know what a serving looks like.  Try not to eat straight from a bag or box. Doing this can lead to overeating. Put the amount you would like to eat in a cup or on a plate to make sure you are eating the right portion.  Use smaller plates, glasses, and bowls to prevent overeating. This is a quick and easy way to practice portion control. If your plate is smaller, less food can fit on it.  Try not to multitask while eating, such as watching TV or using your computer. If it is time to eat, sit down at a table and enjoy your food. Doing this will help you to start recognizing when you are full. It will also make you more aware of what and how much you are eating. HOW CAN I CALORIE COUNT WHEN EATING OUT?  Ask for smaller portion sizes or child-sized portions.  Consider sharing an entree and sides instead of  getting your own entree.  If you get your own entree, eat only half. Ask for a box at the beginning of your meal and put the rest of your entree in it so you are not tempted to eat it.  Look for the calories on the menu. If calories are listed, choose the lower calorie options.  Choose dishes that include vegetables, fruits, whole grains, low-fat dairy products, and lean protein. Focusing on smart food choices from  each of the 5 food groups can help you stay on track at restaurants.  Choose items that are boiled, broiled, grilled, or steamed.  Choose water, milk, unsweetened iced tea, or other drinks without added sugars. If you want an alcoholic beverage, choose a lower calorie option. For example, a regular margarita can have up to 700 calories and a glass of wine has around 150.  Stay away from items that are buttered, battered, fried, or served with cream sauce. Items labeled "crispy" are usually fried, unless stated otherwise.  Ask for dressings, sauces, and syrups on the side. These are usually very high in calories, so do not eat much of them.  Watch out for salads. Many people think salads are a healthy option, but this is often not the case. Many salads come with bacon, fried chicken, lots of cheese, fried chips, and dressing. All of these items have a lot of calories. If you want a salad, choose a garden salad and ask for grilled meats or steak. Ask for the dressing on the side, or ask for olive oil and vinegar or lemon to use as dressing.  Estimate how many servings of a food you are given. For example, a serving of cooked rice is  cup or about the size of half a tennis ball or one cupcake wrapper. Knowing serving sizes will help you be aware of how much food you are eating at restaurants. The list below tells you how big or small some common portion sizes are based on everyday objects.  1 oz--4 stacked dice.  3 oz--1 deck of cards.  1 tsp--1 dice.  1 Tbsp-- a Ping-Pong  ball.  2 Tbsp--1 Ping-Pong ball.   cup--1 tennis ball or 1 cupcake wrapper.  1 cup--1 baseball. Document Released: 01/26/2005 Document Revised: 06/12/2013 Document Reviewed: 12/01/2012 [facility] Patient Information 2015 [place], [place]. This information is not intended to replace advice given to you by your health care provider. Make sure you discuss any questions you have with your health care provider.

## 2013-10-25 NOTE — Telephone Encounter (Signed)
Patient states he has an appt with heart provider in four weeks.  He would rather see what that physician states first b/c he would rather see Dr. Asa Lente and not another provider for his fu here.

## 2013-10-26 ENCOUNTER — Other Ambulatory Visit: Payer: Self-pay | Admitting: Internal Medicine

## 2013-10-26 NOTE — Telephone Encounter (Signed)
Got patient scheduled

## 2013-10-28 ENCOUNTER — Other Ambulatory Visit: Payer: Self-pay | Admitting: Internal Medicine

## 2013-11-09 ENCOUNTER — Other Ambulatory Visit: Payer: Self-pay | Admitting: Internal Medicine

## 2013-11-13 ENCOUNTER — Ambulatory Visit: Payer: Medicare Other | Admitting: Internal Medicine

## 2013-11-20 ENCOUNTER — Ambulatory Visit (INDEPENDENT_AMBULATORY_CARE_PROVIDER_SITE_OTHER): Payer: Medicare Other | Admitting: Internal Medicine

## 2013-11-20 ENCOUNTER — Encounter: Payer: Self-pay | Admitting: Internal Medicine

## 2013-11-20 VITALS — BP 138/66 | HR 77 | Temp 97.5°F | Ht 74.0 in | Wt 187.0 lb

## 2013-11-20 DIAGNOSIS — F329 Major depressive disorder, single episode, unspecified: Secondary | ICD-10-CM | POA: Diagnosis not present

## 2013-11-20 DIAGNOSIS — I313 Pericardial effusion (noninflammatory): Secondary | ICD-10-CM

## 2013-11-20 DIAGNOSIS — I319 Disease of pericardium, unspecified: Secondary | ICD-10-CM

## 2013-11-20 DIAGNOSIS — I3139 Other pericardial effusion (noninflammatory): Secondary | ICD-10-CM

## 2013-11-20 DIAGNOSIS — F32A Depression, unspecified: Secondary | ICD-10-CM

## 2013-11-20 MED ORDER — COLCHICINE 0.6 MG PO TABS: 0.6000 mg | ORAL_TABLET | Freq: Every day | ORAL | Status: DC

## 2013-11-20 NOTE — Assessment & Plan Note (Signed)
Chronic, exacerbated by grief of death of spouse 07/28/2013 (dementia) Hx same wellbutrin - tx 01/2010-05/2010 with same Remote tx sertraline - resumed same 11/2010 - last increase dose 12/2010 Increased to max dose 03/2012 due to increasing fatigue and apathy symptoms  Add Wellbutrin to SSRI 09/2012, but unimproved so DC'd same 12/2012 Use low dose xanax as needed for anxiety/irritability symptoms Verified no plans for SI/HI Pt agrees to call if worsening depression or irritability symptoms

## 2013-11-20 NOTE — Patient Instructions (Addendum)
It was good to see you today.  We have reviewed your prior records including labs and tests today  Medications reviewed and updated Decrease colchicine to once daily pending repeat echocardiogram , no changes recommended at this time.  Continue working with Dr Cathie Olden  Please schedule followup in 3-6 months, call sooner if problems.  Pericardial Effusion Pericardial effusion is an accumulation of extra fluid in the pericardial space. This is the space between the heart and the sac that surrounds it. This space normally contains a small amount of fluid which serves as lubrication for the heart inside the sac. If the amount of fluid increases, it causes problems for the working of the heart. This is called a heart (cardiac) tamponade.  CAUSES  A higher incidence of pericardial effusion is associated with certain diseases. Some common causes of pericardial effusion are:  Infections (bacterial, viral, fungal, from AIDS, etc.).  Cancer which has spread to the pericardial sac.  Fluid accumulation in the sac after a heart attack or open heart surgery.  Injury (a fall with chest injury or as a result of a knife or gunshot wound) with bleeding into the pericardial sac.  Immune diseases (such as Rheumatoid Arthritis) and other arthritis conditions.  Reactions (uncommon) to medications. SYMPTOMS  Very small effusions may cause no problems. Even a small effusion if it comes on rapidly can be deadly. Some common symptoms are:  Chest pain, pressure, discomfort.  Light-headed feeling, fainting.  Cough, shortness of breath.  Feeling of palpitations.  Hiccoughs  Anxiety or confusion DIAGNOSIS  Your caregiver may do a number of blood tests and imaging tests (like X-rays) to help find the cause.   ECHO (Echocardiographic stress test) has become the diagnostic method of choice. This is due to its portability and availability. CT and MRI are also used, and may be more  accurate.  Pericardioscopy, where available, uses a telescope-like instrument to look inside the pericardial sac.  This may be helpful in cases of unexplained pericardial effusions. It allows your caregiver to look at the pericardium and do pericardial biopsies if something abnormal is found.  Sometimes fluid may be removed to help with diagnosing the cause of the accumulation. This is called a pericardiocentesis. TREATMENT  Treatment is directed at removal of the extra pericardial fluid and treating the cause. Small amounts of effusion that are not causing problems can simply be watched. If the fluid is accumulating rapidly and resulting in cardiac tamponade, this can be life-threatening. Emergency removal of fluid must be done.  SEEK IMMEDIATE MEDICAL CARE IF:   You develop chest pain, irregular heart beat (palpitations) or racing heart, shortness of breath, or begin sweating.  You become lightheaded or pass out.  You develop increasing swelling of the legs. Document Released: 09/23/2004 Document Revised: 04/20/2011 Document Reviewed: 09/09/2007 [facility] Patient Information 2015 [place], [place]. This information is not intended to replace advice given to you by your health care provider. Make sure you discuss any questions you have with your health care provider.

## 2013-11-20 NOTE — Assessment & Plan Note (Addendum)
Identified on echo 10/2013 - following with cards for same Reviewed plans for repat echo this week signifcant GI side effects on colchicine BID since prescribed for same - decrese to qd pending follow up echo

## 2013-11-20 NOTE — Progress Notes (Signed)
Subjective:    Patient ID: Luis Glenn, male    DOB: Jul 19, 1927, 80 y.o.   MRN: [id number]  HPI  Patient here for follow up Reviewed chronic medical issues and interval medical events  Past Medical History  Diagnosis Date  . GOUT   . PERIPHERAL VASCULAR DISEASE   . DIVERTICULOSIS, COLON   . BENIGN PROSTATIC HYPERTROPHY   . DEPRESSION   . DIABETES MELLITUS, TYPE II   . GERD   . HYPERLIPIDEMIA   . HYPERTENSION   . CATARACTS   . OSTEOARTHRITIS   . ANXIETY   . ALLERGIC RHINITIS     Review of Systems  Constitutional: Positive for fatigue. Negative for unexpected weight change (stable in past 4 weeks).  Respiratory: Positive for shortness of breath (unchangedd). Negative for cough.   Cardiovascular: Positive for leg swelling (chronic). Negative for chest pain.  Gastrointestinal: Positive for diarrhea (x 1 mo since starting colchicine). Negative for vomiting and rectal pain.       Objective:   Physical Exam  BP 138/66  Pulse 77  Temp(Src) 97.5 F (36.4 C) (Oral)  Ht 6\' 2"  (1.88 m)  Wt 187 lb (84.823 kg)  BMI 24.00 kg/m2  SpO2 98% Wt Readings from Last 3 Encounters:  11/20/13 187 lb (84.823 kg)  10/24/13 188 lb (85.276 kg)  10/18/13 188 lb (85.276 kg)   Constitutional: he appears well-developed and well-nourished. No distress.  Neck: Normal range of motion. Neck supple. No JVD present. No thyromegaly present.  Cardiovascular: Normal rate, regular rhythm and normal heart sounds.  No murmur heard. 1+ pitting BLE edema, chronic. Pulmonary/Chest: Effort normal and breath sounds normal. No respiratory distress. he has no wheezes.  Psychiatric: he has a normal mood and affect. His behavior is normal. Judgment and thought content normal.   Lab Results  Component Value Date   WBC 7.0 12/30/2012   HGB 12.1* 12/30/2012   HCT 36.5* 12/30/2012   PLT 251.0 12/30/2012   GLUCOSE 148* 10/10/2013   CHOL 116 09/18/2013   TRIG 41.0 09/18/2013   HDL 61.50 09/18/2013   LDLCALC 46  09/18/2013   ALT 21 10/10/2013   AST 26 10/10/2013   NA 141 10/10/2013   K 4.0 10/10/2013   CL 108 10/10/2013   CREATININE 1.5 10/10/2013   BUN 41* 10/10/2013   CO2 24 10/10/2013   TSH 2.36 12/30/2012   HGBA1C 5.0 09/18/2013    Dg Cervical Spine 2 Or 3 Views  09/18/2013   CLINICAL DATA:  80 year old male with neck pain. Initial encounter.  EXAM: CERVICAL SPINE - 2-3 VIEW  COMPARISON:  Cervical spine CT 04/27/2008.  FINDINGS: Chronic carotid bifurcation calcified plaque in the neck. Chronic reversal of upper cervical lordosis. Chronic Advanced cervical spine endplate degeneration with bulky endplate osteophytes at C4-C5 and C5-C6. Prevertebral soft tissue contour remains within normal limits. AP alignment and lung apices within normal limits. C1-C2 alignment within normal limits, chronic right C1-C2 joint space loss with interval increased subchondral sclerosis. The cervicothoracic junction is not visible.  IMPRESSION: Advanced degenerative changes in the cervical spine, stable or mildly progressed since 2010.   Electronically Signed   By: Lars Pinks M.D.   On: 09/18/2013 13:39   Dg Knee Complete 4 Views Left  09/18/2013   CLINICAL DATA:  Chronic bilateral knee pain  EXAM: LEFT KNEE - COMPLETE 4+ VIEW  COMPARISON:  None.  FINDINGS: The bones are adequately mineralized for age. There is mild beaking of the tibial spines. The medial  and lateral joint compartments are reasonably well maintained on this nonweightbearing view. There is a small spur from the periphery of the lateral femoral condyles. There are spurs from the superior and inferior margins of the articular surface of the patella and of the adjacent patellar notch of the femur. There is dense calcification in the wall of the popliteal artery. There is no joint effusion.  IMPRESSION: There are mild degenerative changes involving all 3 joint compartments of the left knee.   Electronically Signed   By: David  Martinique   On: 09/18/2013 13:40   Dg Knee Complete 4  Views Right  09/18/2013   CLINICAL DATA:  80 year old male with pain. Initial encounter.  EXAM: RIGHT KNEE - COMPLETE 4+ VIEW  COMPARISON:  None.  FINDINGS: Advanced calcified atherosclerosis in the right lower extremity. Small to moderate knee joint effusion. Mild for age joint space loss. Patella intact. No acute fracture or dislocation. Bone mineralization is within normal limits for age.  IMPRESSION: 1. Small to moderate suprapatellar joint effusion, but otherwise mild for age degenerative changes at the right knee. 2. Calcified peripheral vascular disease.   Electronically Signed   By: Lars Pinks M.D.   On: 09/18/2013 13:41       Assessment & Plan:   Problem List Items Addressed This Visit   Depression     Chronic, exacerbated by grief of death of spouse 08/07/13 (dementia) Hx same wellbutrin - tx 01/2010-05/2010 with same Remote tx sertraline - resumed same 11/2010 - last increase dose 12/2010 Increased to max dose 03/2012 due to increasing fatigue and apathy symptoms  Add Wellbutrin to SSRI 09/2012, but unimproved so DC'd same 12/2012 Use low dose xanax as needed for anxiety/irritability symptoms Verified no plans for SI/HI Pt agrees to call if worsening depression or irritability symptoms    Pericardial effusion - Primary     Identified on echo 10/2013 - following with cards for same Reviewed plans for repat echo this week signifcant GI side effects on colchicine BID since prescribed for same - decrese to qd pending follow up echo

## 2013-11-20 NOTE — Progress Notes (Signed)
Pre visit review using our clinic review tool, if applicable. No additional management support is needed unless otherwise documented below in the visit note. 

## 2013-11-22 ENCOUNTER — Ambulatory Visit (HOSPITAL_COMMUNITY): Payer: Medicare Other | Attending: Cardiovascular Disease

## 2013-11-22 ENCOUNTER — Other Ambulatory Visit (HOSPITAL_COMMUNITY): Payer: Self-pay | Admitting: Cardiovascular Disease

## 2013-11-22 DIAGNOSIS — I313 Pericardial effusion (noninflammatory): Secondary | ICD-10-CM

## 2013-11-22 DIAGNOSIS — I3139 Other pericardial effusion (noninflammatory): Secondary | ICD-10-CM

## 2013-11-22 DIAGNOSIS — I319 Disease of pericardium, unspecified: Secondary | ICD-10-CM | POA: Diagnosis not present

## 2013-11-22 NOTE — Progress Notes (Signed)
Limited 2D Echo completed. 11/22/2013

## 2013-11-24 ENCOUNTER — Other Ambulatory Visit: Payer: Self-pay

## 2013-12-05 ENCOUNTER — Ambulatory Visit (INDEPENDENT_AMBULATORY_CARE_PROVIDER_SITE_OTHER): Payer: Medicare Other | Admitting: Cardiovascular Disease

## 2013-12-05 ENCOUNTER — Encounter: Payer: Self-pay | Admitting: Cardiovascular Disease

## 2013-12-05 VITALS — BP 122/58 | HR 52 | Ht 74.0 in | Wt 185.4 lb

## 2013-12-05 DIAGNOSIS — I481 Persistent atrial fibrillation: Secondary | ICD-10-CM

## 2013-12-05 DIAGNOSIS — I319 Disease of pericardium, unspecified: Secondary | ICD-10-CM

## 2013-12-05 DIAGNOSIS — I4819 Other persistent atrial fibrillation: Secondary | ICD-10-CM

## 2013-12-05 DIAGNOSIS — I3139 Other pericardial effusion (noninflammatory): Secondary | ICD-10-CM

## 2013-12-05 DIAGNOSIS — I313 Pericardial effusion (noninflammatory): Secondary | ICD-10-CM

## 2013-12-05 LAB — CBC WITH DIFFERENTIAL/PLATELET
Basophils Absolute: 0 10*3/uL (ref 0.0–0.1)
Basophils Relative: 0.3 % (ref 0.0–3.0)
EOS PCT: 5.2 % — AB (ref 0.0–5.0)
Eosinophils Absolute: 0.4 10*3/uL (ref 0.0–0.7)
HCT: 38.3 % — ABNORMAL LOW (ref 39.0–52.0)
Hemoglobin: 12.5 g/dL — ABNORMAL LOW (ref 13.0–17.0)
LYMPHS ABS: 1.2 10*3/uL (ref 0.7–4.0)
Lymphocytes Relative: 14.1 % (ref 12.0–46.0)
MCHC: 32.6 g/dL (ref 30.0–36.0)
MCV: 88.3 fl (ref 78.0–100.0)
MONOS PCT: 6.6 % (ref 3.0–12.0)
Monocytes Absolute: 0.6 10*3/uL (ref 0.1–1.0)
NEUTROS PCT: 73.8 % (ref 43.0–77.0)
Neutro Abs: 6.2 10*3/uL (ref 1.4–7.7)
Platelets: 241 10*3/uL (ref 150.0–400.0)
RBC: 4.34 Mil/uL (ref 4.22–5.81)
RDW: 16.1 % — AB (ref 11.5–15.5)
WBC: 8.5 10*3/uL (ref 4.0–10.5)

## 2013-12-05 LAB — BASIC METABOLIC PANEL
BUN: 42 mg/dL — ABNORMAL HIGH (ref 6–23)
CO2: 25 meq/L (ref 19–32)
Calcium: 9.2 mg/dL (ref 8.4–10.5)
Chloride: 105 mEq/L (ref 96–112)
Creatinine, Ser: 1.5 mg/dL (ref 0.4–1.5)
GFR: 48.45 mL/min — AB (ref >60.00)
Glucose, Bld: 176 mg/dL — ABNORMAL HIGH (ref 70–99)
Potassium: 4.4 mEq/L (ref 3.5–5.1)
SODIUM: 140 meq/L (ref 135–145)

## 2013-12-05 MED ORDER — APIXABAN 2.5 MG PO TABS: 2.5000 mg | ORAL_TABLET | Freq: Two times a day (BID) | ORAL | Status: DC

## 2013-12-05 NOTE — Progress Notes (Signed)
Luis Glenn Date of Birth  1927-10-17       [facility]    [employer] [address], [address], [place], [place]  [address]   [place], [place]  [address] [address]   Fax  [phone]     Fax [phone]  Problem List: 1. Pericardial effusion 2. Gout 3. Hypertension 4. Atrial fibrillation 5. Hyperlipidemia 6. Moderate pulmonary hypertension 7. Moderate aortic insufficiency 8. Weight loss   History of Present Illness:  Luis Glenn is an 80 y.o. gentleman who is referred to [place] today for further evaluation and management of a pericardial effusion.  Echo:  Left ventricle: E/e&'>22.2 suggestive of elevated LV filling pressures. The cavity size was normal. There was moderate concentric hypertrophy. Systolic function was normal. The estimated ejection fraction was in the range of 55% to 60%. Wall motion was normal; there were no regional wall motion abnormalities. - Aortic valve: Trileaflet; normal thickness, mildly calcified leaflets. There was moderate regurgitation. - Aorta: Ascending aortic diameter: 39 mm (S). - Ascending aorta: The ascending aorta was mildly dilated. - Mitral valve: Mean gradient (D): 4 mm Hg. Peak gradient (D): 11 mm Hg. Valve area by pressure half-time: 2.2 cm^2. Valve area by continuity equation (using LVOT flow): 3.38 cm^2. - Left atrium: The atrium was severely dilated. - Right ventricle: The cavity size was mildly dilated. Wall thickness was normal. - Tricuspid valve: There was mild regurgitation. - Pulmonic valve: There was mild regurgitation. - Pulmonary arteries: PA peak pressure: 53 mm Hg (S). - Pericardium, extracardiac: The pericardial measures 4.31cm at its largest diameter. A moderate to large, free-flowing pericardial effusion was identified circumferential to the heart. The fluid had no internal echoes.There was no evidence of hemodynamic compromise.   He has lost  weight over the past several years and has lost 10-20 lbs over the past 2 months.   No hx of cancer.   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  Worked for [employer].    Does not get any regular exercise.  He took care of his ill wife for the past 4 years - she died this past June of Lung Cancer . He has a hx of smoking - quit 25 year ago.    He has had chronic severe leg edema for years- always better in the morning.  Better with lasix.   Has not been eating well -  He had a CXR in Nov. 2014 which showed cardiomegaly - a pericardial effusion certainly could have been present.   He typically eats prepared food - frozen dinners / microwavable dinners.    Oct. 27, 2015:  Luis Glenn is followed for his pericardial effusoin and atral fibrillation.  Feels fine Still has significant foot and lower leg swelling.  Goes completely away at night, returns throughout the day  Current Outpatient Prescriptions on File Prior to Visit  Medication Sig Dispense Refill  . acetaminophen (TYLENOL) 500 MG tablet Take 500 mg by mouth at bedtime.        . ALPRAZolam (XANAX) 0.5 MG tablet Take 1 tablet (0.5 mg total) by mouth 3 (three) times daily as needed for anxiety or sleep.  90 tablet  3  . amLODipine-benazepril (LOTREL) 10-20 MG per capsule TAKE ONE CAPSULE BY MOUTH ONE TIME DAILY   90 capsule  0  . colchicine 0.6 MG tablet Take 1 tablet (0.6 mg total) by mouth  daily.  61 tablet  11  . fluticasone (FLONASE) 50 MCG/ACT nasal spray Place 2 sprays into both nostrils daily.  16 g  6  . furosemide (LASIX) 40 MG tablet TAKE ONE TABLET BY MOUTH TWICE DAILY   60 tablet  5  . Multiple Vitamin (MULTIVITAMIN) tablet Take 1 tablet by mouth daily.        . naproxen sodium (ANAPROX) 220 MG tablet Take 220 mg by mouth as needed.       Marland Kitchen oxyCODONE (OXY IR/ROXICODONE) 5 MG immediate release tablet Take 1 tablet (5 mg total) by mouth every 6 (six) hours as needed for severe pain.  60 tablet  0  . potassium  chloride SA (K-DUR,KLOR-CON) 20 MEQ tablet TAKE ONE TABLET BY MOUTH ONE TIME DAILY   30 tablet  4  . sertraline (ZOLOFT) 100 MG tablet Take one tablet by mouth one time daily  30 tablet  11  . simvastatin (ZOCOR) 40 MG tablet TAKE ONE TABLET BY MOUTH NIGHTLY AT BEDTIME   90 tablet  0  . tamsulosin (FLOMAX) 0.4 MG CAPS capsule TAKE ONE CAPSULE BY MOUTH ONE TIME DAILY   30 capsule  5  . terazosin (HYTRIN) 10 MG capsule TAKE ONE CAPSULE BY MOUTH ONE TIME DAILY   90 capsule  1   No current facility-administered medications on file prior to visit.    Allergies  Allergen Reactions  . Penicillins     Past Medical History  Diagnosis Date  . GOUT   . PERIPHERAL VASCULAR DISEASE   . DIVERTICULOSIS, COLON   . BENIGN PROSTATIC HYPERTROPHY   . DEPRESSION   . DIABETES MELLITUS, TYPE II   . GERD   . HYPERLIPIDEMIA   . HYPERTENSION   . CATARACTS   . OSTEOARTHRITIS   . ANXIETY   . ALLERGIC RHINITIS     Past Surgical History  Procedure Laterality Date  . Tonsillectomy  10/1942    History  Smoking status  . Former Smoker -- 3.00 packs/day for 40 years  . Types: Cigarettes  . Quit date: 02/09/1989  Smokeless tobacco  . Not on file    History  Alcohol Use  . Yes    Comment: occassional    Family History  Problem Relation Age of Onset  . Diabetes Mother   . Hypertension Mother   . Diabetes Father   . Hypertension Father   . Alcohol abuse Other   . Breast cancer Other     Reviw of Systems:  Reviewed in the HPI.  All other systems are negative.  Physical Exam: Blood pressure 122/58, pulse 52, height 6\' 2"  (1.88 m), weight 185 lb 6.4 oz (84.097 kg). Wt Readings from Last 3 Encounters:  12/05/13 185 lb 6.4 oz (84.097 kg)  11/20/13 187 lb (84.823 kg)  10/24/13 188 lb (85.276 kg)     General: Elderly gentleman, appears chronicaly ill.    Head: Normocephalic, atraumatic, sclera non-icteric, mucus membranes are moist,   Neck: Supple. Carotids are 2 + without bruits. No  JVD   Lungs: Clear   Heart: irregularly irregular,  Soft systolic murmur.  Soft diastolic murmur  Abdomen: Soft, non-tender, non-distended with normal bowel sounds.  Msk:  Strength and tone are normal   Extremities: chronic statis changes, scaley skin,  3-4+ pitting edema  Neuro: CN II - XII intact.  Alert and oriented X 3.   Psych:  Normal   ECG:  Assessment / Plan:

## 2013-12-05 NOTE — Assessment & Plan Note (Signed)
He has had atrial fibrillation for years. His rate is well controlled. We'll start him on Eliquis 2.5 mg  BID ( age 80, Cr = 1.5)  His rate is well controlled. At this point his pericardial effusion appears to be stable and do not think that we'll need to perform an urgent pericardial window or pericardiocentesis.

## 2013-12-05 NOTE — Patient Instructions (Addendum)
Your physician has recommended you make the following change in your medication:  START Eliquis 2.5 mg twice daily  Your physician has requested that you have an echocardiogram in 3-4 months before your next appointment with Dr. Acie Fredrickson. Echocardiography is a painless test that uses sound waves to create images of your heart. It provides your doctor with information about the size and shape of your heart and how well your heart's chambers and valves are working. This procedure takes approximately one hour. There are no restrictions for this procedure.  Your physician recommends that you have lab work:  TODAY - CBC and BMET  Your physician recommends that you schedule a follow-up appointment in: 3-4 months with Dr. Acie Fredrickson after the echocardiogram  Your physician recommends that you return for lab work in: 1 month for CVRR appointment and lab work to check Eliquis - pt will get lab work checked at Dr. Katheren Puller office on 12/7

## 2013-12-05 NOTE — Assessment & Plan Note (Signed)
The pericardial effusion appears to be about the same-perhaps slightly smaller than it was one month ago. Continue with the current dose of colchicine. I note that his dose has been reduced slightly by his medical doctor because of diarrhea.  Continue to follow. He is asymptomatic.  Will repeat echo in 3 months to re-evaluate

## 2013-12-06 ENCOUNTER — Telehealth: Payer: Self-pay | Admitting: Nurse Practitioner

## 2013-12-06 NOTE — Telephone Encounter (Signed)
Lab results reviewed with patient who verbalized understanding.  Patient states he has started Eliquis as directed

## 2013-12-20 ENCOUNTER — Other Ambulatory Visit: Payer: Self-pay | Admitting: Internal Medicine

## 2013-12-21 ENCOUNTER — Other Ambulatory Visit: Payer: Self-pay | Admitting: Internal Medicine

## 2014-01-03 ENCOUNTER — Other Ambulatory Visit: Payer: Self-pay | Admitting: Internal Medicine

## 2014-01-05 ENCOUNTER — Other Ambulatory Visit: Payer: Self-pay | Admitting: Internal Medicine

## 2014-01-08 ENCOUNTER — Other Ambulatory Visit: Payer: Self-pay

## 2014-01-08 MED ORDER — OXYCODONE HCL 5 MG PO TABS: 5.0000 mg | ORAL_TABLET | Freq: Four times a day (QID) | ORAL | Status: DC | PRN

## 2014-01-15 ENCOUNTER — Ambulatory Visit: Payer: Medicare Other | Admitting: Internal Medicine

## 2014-01-16 ENCOUNTER — Telehealth: Payer: Self-pay | Admitting: *Deleted

## 2014-01-16 NOTE — Telephone Encounter (Signed)
PA for Eliquis sent via CoverMyMeds. 

## 2014-01-16 NOTE — Telephone Encounter (Signed)
PA for Eliquis approved through 01/17/2015.

## 2014-01-24 ENCOUNTER — Ambulatory Visit (INDEPENDENT_AMBULATORY_CARE_PROVIDER_SITE_OTHER): Payer: Medicare Other | Admitting: Family

## 2014-01-24 ENCOUNTER — Other Ambulatory Visit (INDEPENDENT_AMBULATORY_CARE_PROVIDER_SITE_OTHER): Payer: Medicare Other

## 2014-01-24 VITALS — BP 138/50 | HR 70 | Temp 97.6°F | Resp 18 | Ht 74.0 in | Wt 186.0 lb

## 2014-01-24 DIAGNOSIS — I10 Essential (primary) hypertension: Secondary | ICD-10-CM | POA: Diagnosis not present

## 2014-01-24 DIAGNOSIS — R634 Abnormal weight loss: Secondary | ICD-10-CM | POA: Diagnosis not present

## 2014-01-24 LAB — CBC
HCT: 36.8 % — ABNORMAL LOW (ref 39.0–52.0)
HEMOGLOBIN: 12.1 g/dL — AB (ref 13.0–17.0)
MCHC: 32.8 g/dL (ref 30.0–36.0)
MCV: 87.4 fl (ref 78.0–100.0)
PLATELETS: 245 10*3/uL (ref 150.0–400.0)
RBC: 4.21 Mil/uL — ABNORMAL LOW (ref 4.22–5.81)
RDW: 14.6 % (ref 11.5–15.5)
WBC: 7.3 10*3/uL (ref 4.0–10.5)

## 2014-01-24 LAB — BASIC METABOLIC PANEL
BUN: 54 mg/dL — AB (ref 6–23)
CHLORIDE: 107 meq/L (ref 96–112)
CO2: 23 mEq/L (ref 19–32)
Calcium: 9.2 mg/dL (ref 8.4–10.5)
Creatinine, Ser: 1.8 mg/dL — ABNORMAL HIGH (ref 0.4–1.5)
GFR: 38.84 mL/min — ABNORMAL LOW (ref >60.00)
GLUCOSE: 116 mg/dL — AB (ref 70–99)
POTASSIUM: 4.6 meq/L (ref 3.5–5.1)
Sodium: 141 mEq/L (ref 135–145)

## 2014-01-24 NOTE — Progress Notes (Signed)
Pre visit review using our clinic review tool, if applicable. No additional management support is needed unless otherwise documented below in the visit note. 

## 2014-01-24 NOTE — Assessment & Plan Note (Signed)
Previous loss of weight has stabilized. Patient indicates he is eating well and feeling better. Continue to monitor as needed.

## 2014-01-24 NOTE — Assessment & Plan Note (Signed)
Blood pressure remained stable and below goal. Continue amlodipine-benazepril and terazosin at current dose. Obtain basic metabolic panel to check kidney function.

## 2014-01-24 NOTE — Progress Notes (Signed)
Subjective:    Patient ID: Luis Glenn, male    DOB: 11/19/1927, 80 y.o.   MRN: [id number]  Chief Complaint  Patient presents with  . Follow-up    Needs bloodwork done and faxed    HPI:  Glenn Luis is a 80 y.o. male who presents today for follow up.   1) Weight loss - Has remain stable continues to eat well. Reports sometimes his appetite is too good.   Wt Readings from Last 3 Encounters:  01/24/14 186 lb (84.369 kg)  12/05/13 185 lb 6.4 oz (84.097 kg)  11/20/13 187 lb (84.823 kg)    2) Hypertension - currently maintained on Lotrel and terazosin. Denies any chest pain/discomfort, shortness of breath, heart palpitations. Denies any changes in his vision.    BP Readings from Last 3 Encounters:  01/24/14 138/50  12/05/13 122/58  11/20/13 138/66    Allergies  Allergen Reactions  . Penicillins     Current Outpatient Prescriptions on File Prior to Visit  Medication Sig Dispense Refill  . acetaminophen (TYLENOL) 500 MG tablet Take 500 mg by mouth at bedtime.      . ALPRAZolam (XANAX) 0.5 MG tablet take 1 tablet by mouth 3 times daily as needed for anxiety or sleep 90 tablet 0  . amLODipine-benazepril (LOTREL) 10-20 MG per capsule Take 1 capsule by mouth daily. 90 capsule 1  . apixaban (ELIQUIS) 2.5 MG TABS tablet Take 1 tablet (2.5 mg total) by mouth 2 (two) times daily. 60 tablet 11  . colchicine 0.6 MG tablet Take 1 tablet (0.6 mg total) by mouth daily. 61 tablet 11  . fluticasone (FLONASE) 50 MCG/ACT nasal spray Place 2 sprays into both nostrils daily. 16 g 6  . furosemide (LASIX) 40 MG tablet TAKE ONE TABLET BY MOUTH TWICE DAILY  60 tablet 5  . Multiple Vitamin (MULTIVITAMIN) tablet Take 1 tablet by mouth daily.      . naproxen sodium (ANAPROX) 220 MG tablet Take 220 mg by mouth as needed.     Marland Kitchen oxyCODONE (OXY IR/ROXICODONE) 5 MG immediate release tablet Take 1 tablet (5 mg total) by mouth every 6 (six) hours as needed for severe pain. 60 tablet 0  . potassium  chloride SA (K-DUR,KLOR-CON) 20 MEQ tablet TAKE ONE TABLET BY MOUTH ONE TIME DAILY  30 tablet 4  . simvastatin (ZOCOR) 40 MG tablet TAKE ONE TABLET BY MOUTH NIGHTLY AT BEDTIME  90 tablet 0  . tamsulosin (FLOMAX) 0.4 MG CAPS capsule TAKE ONE CAPSULE BY MOUTH ONE TIME DAILY  30 capsule 5  . terazosin (HYTRIN) 10 MG capsule TAKE ONE CAPSULE BY MOUTH ONE TIME DAILY  90 capsule 1   No current facility-administered medications on file prior to visit.    Review of Systems    See HPI  Objective:    BP 138/50 mmHg  Pulse 70  Temp(Src) 97.6 F (36.4 C)  Resp 18  Ht 6\' 2"  (1.88 m)  Wt 186 lb (84.369 kg)  BMI 23.87 kg/m2  SpO2 97% Nursing note and vital signs reviewed.  Physical Exam  Constitutional: He is oriented to person, place, and time. He appears well-developed and well-nourished. No distress.  Cardiovascular: Normal rate, regular rhythm, normal heart sounds and intact distal pulses.   Pulmonary/Chest: Effort normal and breath sounds normal.  Neurological: He is alert and oriented to person, place, and time.  Skin: Skin is warm and dry.  Psychiatric: He has a normal mood and affect. His behavior is normal.  Judgment and thought content normal.       Assessment & Plan:

## 2014-01-24 NOTE — Patient Instructions (Signed)
Thank you for choosing [facility].  Summary/Instructions:  Please stop by the lab on the basement level of the building for your blood work. Your results will be released to MyChart (or called to you) after review, usually within 72 hours after test completion. If any changes need to be made, you will be notified at that same time.     

## 2014-01-27 ENCOUNTER — Other Ambulatory Visit: Payer: Self-pay | Admitting: Internal Medicine

## 2014-01-28 ENCOUNTER — Encounter: Payer: Self-pay | Admitting: Family

## 2014-03-05 ENCOUNTER — Telehealth: Payer: Self-pay | Admitting: Internal Medicine

## 2014-03-08 ENCOUNTER — Other Ambulatory Visit: Payer: Self-pay | Admitting: Internal Medicine

## 2014-03-08 MED ORDER — OXYCODONE HCL 5 MG PO TABS: 5.0000 mg | ORAL_TABLET | Freq: Two times a day (BID) | ORAL | Status: DC | PRN

## 2014-03-08 NOTE — Telephone Encounter (Signed)
Patient called to check status of rx request for oxycodone. This was sent to [place] on Monday, 03/05/14.

## 2014-03-08 NOTE — Telephone Encounter (Signed)
Printed and signed.  

## 2014-03-13 ENCOUNTER — Ambulatory Visit (HOSPITAL_COMMUNITY): Payer: Medicare Other | Attending: Cardiology

## 2014-03-13 DIAGNOSIS — I313 Pericardial effusion (noninflammatory): Secondary | ICD-10-CM | POA: Insufficient documentation

## 2014-03-13 DIAGNOSIS — I319 Disease of pericardium, unspecified: Secondary | ICD-10-CM

## 2014-03-13 DIAGNOSIS — I3139 Other pericardial effusion (noninflammatory): Secondary | ICD-10-CM

## 2014-03-13 NOTE — Progress Notes (Signed)
2D Echo completed. 03/13/2014

## 2014-03-20 ENCOUNTER — Ambulatory Visit (INDEPENDENT_AMBULATORY_CARE_PROVIDER_SITE_OTHER): Payer: Medicare Other | Admitting: Cardiovascular Disease

## 2014-03-20 ENCOUNTER — Encounter: Payer: Self-pay | Admitting: Cardiovascular Disease

## 2014-03-20 VITALS — BP 134/62 | HR 69 | Ht 74.0 in | Wt 202.0 lb

## 2014-03-20 DIAGNOSIS — I3139 Other pericardial effusion (noninflammatory): Secondary | ICD-10-CM

## 2014-03-20 DIAGNOSIS — I319 Disease of pericardium, unspecified: Secondary | ICD-10-CM | POA: Diagnosis not present

## 2014-03-20 DIAGNOSIS — I313 Pericardial effusion (noninflammatory): Secondary | ICD-10-CM

## 2014-03-20 LAB — BASIC METABOLIC PANEL
BUN: 42 mg/dL — ABNORMAL HIGH (ref 6–23)
CHLORIDE: 107 meq/L (ref 96–112)
CO2: 26 mEq/L (ref 19–32)
Calcium: 9.3 mg/dL (ref 8.4–10.5)
Creatinine, Ser: 1.58 mg/dL — ABNORMAL HIGH (ref 0.40–1.50)
GFR: 44.55 mL/min — ABNORMAL LOW (ref >60.00)
Glucose, Bld: 120 mg/dL — ABNORMAL HIGH (ref 70–99)
POTASSIUM: 4.1 meq/L (ref 3.5–5.1)
Sodium: 141 mEq/L (ref 135–145)

## 2014-03-20 MED ORDER — COLCHICINE 0.6 MG PO TABS: 0.6000 mg | ORAL_TABLET | Freq: Two times a day (BID) | ORAL | Status: DC

## 2014-03-20 NOTE — Progress Notes (Signed)
Cardiology Office Note   Date:  03/20/2014   ID:  QUAMERE MUSSELL, DOB 03/27/1927, MRN [id number]  PCP:  Gwendolyn Grant, MD  Cardiologist:   Thayer Headings, MD   Chief Complaint  Patient presents with  . Follow-up    pericardial effusion      History of Present Illness: Luis Glenn is a 81 y.o. male who presents for follow up of his pericardial effusion.   He had an echo done last week that shows a persistent moderate sized pericardial effusion that may be slightly improved compared to the initial effusion He has had to back off the Colchicine to once a day because of diarrhea.  Now he' s having constipation and want to go back up on the dose.  He denies having any chest pain. He gets only occasional episodes of shortness breath when he  lies flat on his back. He is able to do all of his normal activities without significant difficulties.   Past Medical History  Diagnosis Date  . GOUT   . PERIPHERAL VASCULAR DISEASE   . DIVERTICULOSIS, COLON   . BENIGN PROSTATIC HYPERTROPHY   . DEPRESSION   . DIABETES MELLITUS, TYPE II   . GERD   . HYPERLIPIDEMIA   . HYPERTENSION   . CATARACTS   . OSTEOARTHRITIS   . ANXIETY   . ALLERGIC RHINITIS     Past Surgical History  Procedure Laterality Date  . Tonsillectomy  10/1942     Current Outpatient Prescriptions  Medication Sig Dispense Refill  . acetaminophen (TYLENOL) 500 MG tablet Take 500 mg by mouth at bedtime.      . ALPRAZolam (XANAX) 0.5 MG tablet take 1 tablet by mouth 3 times daily as needed for anxiety or sleep 90 tablet 0  . amLODipine-benazepril (LOTREL) 10-20 MG per capsule Take 1 capsule by mouth daily. 90 capsule 1  . apixaban (ELIQUIS) 2.5 MG TABS tablet Take 1 tablet (2.5 mg total) by mouth 2 (two) times daily. 60 tablet 11  . colchicine 0.6 MG tablet Take 1 tablet (0.6 mg total) by mouth daily. 61 tablet 11  . fluticasone (FLONASE) 50 MCG/ACT nasal spray Place 2 sprays into both nostrils daily. 16 g 6  .  furosemide (LASIX) 40 MG tablet TAKE ONE TABLET BY MOUTH TWICE DAILY  60 tablet 5  . Multiple Vitamin (MULTIVITAMIN) tablet Take 1 tablet by mouth daily.      . naproxen sodium (ANAPROX) 220 MG tablet Take 220 mg by mouth as needed.     Marland Kitchen oxyCODONE (OXY IR/ROXICODONE) 5 MG immediate release tablet Take 1 tablet (5 mg total) by mouth 2 (two) times daily as needed for severe pain. 60 tablet 0  . potassium chloride SA (K-DUR,KLOR-CON) 20 MEQ tablet TAKE ONE TABLET BY MOUTH ONE TIME DAILY  30 tablet 4  . simvastatin (ZOCOR) 40 MG tablet Take 1 tablet (40 mg total) by mouth daily at 6 PM. 90 tablet 1  . tamsulosin (FLOMAX) 0.4 MG CAPS capsule TAKE ONE CAPSULE BY MOUTH ONE TIME DAILY  30 capsule 5  . terazosin (HYTRIN) 10 MG capsule TAKE ONE CAPSULE BY MOUTH ONE TIME DAILY  90 capsule 1   No current facility-administered medications for this visit.    Allergies:   Penicillins    Social History:  The patient  reports that he quit smoking about 25 years ago. His smoking use included Cigarettes. He has a 120 pack-year smoking history. He does not have any smokeless  tobacco history on file. He reports that he drinks alcohol. He reports that he does not use illicit drugs.   Family History:  The patient's family history includes Alcohol abuse in his other; Breast cancer in his other; Diabetes in his father and mother; Hypertension in his father and mother.    ROS:  Please see the history of present illness.    Review of Systems: Constitutional:  denies fever, chills, diaphoresis, appetite change and fatigue.  HEENT: denies photophobia, eye pain, redness, hearing loss, ear pain, congestion, sore throat, rhinorrhea, sneezing, neck pain, neck stiffness and tinnitus.  Respiratory: denies SOB, DOE, cough, chest tightness, and wheezing.  Cardiovascular: denies chest pain, palpitations and leg swelling.  Gastrointestinal: denies nausea, vomiting, abdominal pain, diarrhea, constipation, blood in stool.    Genitourinary: denies dysuria, urgency, frequency, hematuria, flank pain and difficulty urinating.  Musculoskeletal: denies  myalgias, back pain, joint swelling, arthralgias and gait problem.   Skin: denies pallor, rash and wound.  Neurological: denies dizziness, seizures, syncope, weakness, light-headedness, numbness and headaches.   Hematological: denies adenopathy, easy bruising, personal or family bleeding history.  Psychiatric/ Behavioral: denies suicidal ideation, mood changes, confusion, nervousness, sleep disturbance and agitation.       All other systems are reviewed and negative.    PHYSICAL EXAM: VS:  Ht 6\' 2"  (1.88 m)  Wt 202 lb (91.627 kg)  BMI 25.92 kg/m2 , BMI Body mass index is 25.92 kg/(m^2). GEN: elderly gentleman,   HEENT: normal Neck: no JVD, carotid bruits, or masses Cardiac: irreg. Irreg. ; no murmurs, rubs, or gallops,.  He has chronic leg edema. R>L  Respiratory:  clear to auscultation bilaterally, normal work of breathing GI: soft, nontender, nondistended, + BS MS: no deformity or atrophy Skin: warm and dry, no rash Neuro:  Strength and sensation are intact Psych: normal   EKG:  EKG is not ordered today.    Recent Labs: 10/10/2013: ALT 21 01/24/2014: BUN 54*; Creatinine 1.8*; Hemoglobin 12.1*; Platelets 245.0; Potassium 4.6; Sodium 141    Lipid Panel    Component Value Date/Time   CHOL 116 09/18/2013 1402   TRIG 41.0 09/18/2013 1402   TRIG 81 01/10/2008   HDL 61.50 09/18/2013 1402   CHOLHDL 2 09/18/2013 1402   VLDL 8.2 09/18/2013 1402   LDLCALC 46 09/18/2013 1402      Wt Readings from Last 3 Encounters:  03/20/14 202 lb (91.627 kg)  01/24/14 186 lb (84.369 kg)  12/05/13 185 lb 6.4 oz (84.097 kg)      Other studies Reviewed: Additional studies/ records that were reviewed today include: . Review of the above records demonstrates:    ASSESSMENT AND PLAN:  1. Pericardial effusion - his pericardial effusion persists. It may be  slightly better compared to his initial echo card gram. He had decreased his colchicine because of diarrhea. He now is having problem with constipation and wants to go back up on his colchicine to twice a day. I think this would be a good suggestion.  We'll reassess him in 3 months with a follow-up echocardiogram and I'll see him one week later in the office.  2. Gout  3. Hypertension - his blood pressure remains well-controlled.  4. Atrial fibrillation- and tinged of atrial fibrillation. He's on Eliquis   5. Hyperlipidemia- followed by his medical doctor  6. Moderate pulmonary hypertension  7. Moderate aortic insufficiency  8. Weight loss    Current medicines are reviewed at length with the patient today.  The patient does not  have concerns regarding medicines.  The following changes have been made:  Increase colchicine to 0.6 mg BID   Disposition:   FU with me in 3 months     Signed, Nahser, Wonda Cheng, MD  03/20/2014 1:52 PM    [facility] [address], [place], [place]  [address] Phone: [phone]; Fax: [phone]

## 2014-03-20 NOTE — Patient Instructions (Signed)
Your physician has recommended you make the following change in your medication:  INCREASE Colchicine to 0.6 mg twice daily  Your physician has requested that you have an echocardiogram in 3 months before your appointment with Dr. Acie Fredrickson. Echocardiography is a painless test that uses sound waves to create images of your heart. It provides your doctor with information about the size and shape of your heart and how well your heart's chambers and valves are working. This procedure takes approximately one hour. There are no restrictions for this procedure.  Your physician recommends that you schedule a follow-up appointment in: 3 months with Dr. Acie Fredrickson (a few days or week after your echo)

## 2014-03-27 ENCOUNTER — Other Ambulatory Visit: Payer: Self-pay

## 2014-03-27 MED ORDER — FUROSEMIDE 40 MG PO TABS: 40.0000 mg | ORAL_TABLET | Freq: Two times a day (BID) | ORAL | Status: DC

## 2014-04-13 ENCOUNTER — Other Ambulatory Visit: Payer: Self-pay | Admitting: Internal Medicine

## 2014-04-13 ENCOUNTER — Telehealth: Payer: Self-pay | Admitting: Internal Medicine

## 2014-04-13 NOTE — Telephone Encounter (Signed)
Already sent see refill encounter...Luis Glenn

## 2014-04-13 NOTE — Telephone Encounter (Signed)
Pt called in requesting refill on potassium chloride SA (K-DUR,KLOR-CON) 20 MEQ tablet   [facility] on [address]

## 2014-04-16 ENCOUNTER — Other Ambulatory Visit: Payer: Self-pay

## 2014-04-17 ENCOUNTER — Encounter: Payer: Self-pay | Admitting: Internal Medicine

## 2014-04-17 DIAGNOSIS — L84 Corns and callosities: Secondary | ICD-10-CM

## 2014-04-25 ENCOUNTER — Telehealth: Payer: Self-pay | Admitting: Internal Medicine

## 2014-04-27 MED ORDER — OXYCODONE HCL 5 MG PO TABS
5.0000 mg | ORAL_TABLET | Freq: Two times a day (BID) | ORAL | Status: DC | PRN
Start: 2014-04-27 — End: 2014-06-18

## 2014-04-27 NOTE — Telephone Encounter (Signed)
Printed and signed. Please call patient.

## 2014-05-03 ENCOUNTER — Other Ambulatory Visit: Payer: Self-pay | Admitting: *Deleted

## 2014-05-03 MED ORDER — TERAZOSIN HCL 10 MG PO CAPS: 10.0000 mg | ORAL_CAPSULE | Freq: Every day | ORAL | Status: DC

## 2014-05-07 ENCOUNTER — Ambulatory Visit (INDEPENDENT_AMBULATORY_CARE_PROVIDER_SITE_OTHER): Payer: Medicare Other | Admitting: Podiatry

## 2014-05-07 ENCOUNTER — Encounter: Payer: Self-pay | Admitting: Podiatry

## 2014-05-07 VITALS — BP 177/66 | HR 65 | Resp 13 | Ht 74.0 in | Wt 202.0 lb

## 2014-05-07 DIAGNOSIS — G629 Polyneuropathy, unspecified: Secondary | ICD-10-CM

## 2014-05-07 DIAGNOSIS — E1342 Other specified diabetes mellitus with diabetic polyneuropathy: Secondary | ICD-10-CM

## 2014-05-07 DIAGNOSIS — E1142 Type 2 diabetes mellitus with diabetic polyneuropathy: Secondary | ICD-10-CM

## 2014-05-07 DIAGNOSIS — M79676 Pain in unspecified toe(s): Secondary | ICD-10-CM

## 2014-05-07 DIAGNOSIS — Q828 Other specified congenital malformations of skin: Secondary | ICD-10-CM

## 2014-05-07 DIAGNOSIS — B351 Tinea unguium: Secondary | ICD-10-CM

## 2014-05-07 NOTE — Progress Notes (Signed)
   Subjective:    Patient ID: Luis Glenn, male    DOB: [birth year], 81 y.o.   MRN: [id number]  HPI Comments: N Callouses, 10 toenails L B/L plantar 3rd MPJ, toenails  D and O long-term C hard, painful callouses A Diabetes and difficulty trimming T none  Diabetes   Patient denies any history of diabetic foot ulcerations or claudication   Review of Systems  All other systems reviewed and are negative.      Objective:   Physical Exam  Vascular: DP pulses 2/4 bilaterally PT pulses 2/4 bilaterally Pitting edema ankles bilaterally  Neurological: Ankle reflex equal and reactive bilaterally Sensation to 10 g monofilament wire intact 5/5 bilaterally Vibratory sensation nonreactive bilaterally  Dermatological: The toenails are extremely elongated, hypertrophic, incurvated, discolored and tender to direct palpation 6-10 Nucleated plantar keratoses subsecond MPJ bilaterally     Musculoskeletal: Unsteady gait pattern No restriction ankle, subtalar, midtarsal joints bilaterally    Assessment & Plan:   Assessment: Diabetic peripheral neuropathy Symptomatic onychomycoses 6-10 Porokeratosis 2  Plan: Reviewed results of examination today with patient  Debridement of toenails 10 and porokeratosis 2 without a bleeding

## 2014-05-07 NOTE — Patient Instructions (Signed)
Diabetes and Foot Care Diabetes may cause you to have problems because of poor blood supply (circulation) to your feet and legs. This may cause the skin on your feet to become thinner, break easier, and heal more slowly. Your skin may become dry, and the skin may peel and crack. You may also have nerve damage in your legs and feet causing decreased feeling in them. You may not notice minor injuries to your feet that could lead to infections or more serious problems. Taking care of your feet is one of the most important things you can do for yourself.  HOME CARE INSTRUCTIONS  Wear shoes at all times, even in the house. Do not go barefoot. Bare feet are easily injured.  Check your feet daily for blisters, cuts, and redness. If you cannot see the bottom of your feet, use a mirror or ask someone for help.  Wash your feet with warm water (do not use hot water) and mild soap. Then pat your feet and the areas between your toes until they are completely dry. Do not soak your feet as this can dry your skin.  Apply a moisturizing lotion or petroleum jelly (that does not contain alcohol and is unscented) to the skin on your feet and to dry, brittle toenails. Do not apply lotion between your toes.  Trim your toenails straight across. Do not dig under them or around the cuticle. File the edges of your nails with an emery board or nail file.  Do not cut corns or calluses or try to remove them with medicine.  Wear clean socks or stockings every day. Make sure they are not too tight. Do not wear knee-high stockings since they may decrease blood flow to your legs.  Wear shoes that fit properly and have enough cushioning. To break in new shoes, wear them for just a few hours a day. This prevents you from injuring your feet. Always look in your shoes before you put them on to be sure there are no objects inside.  Do not cross your legs. This may decrease the blood flow to your feet.  If you find a minor scrape,  cut, or break in the skin on your feet, keep it and the skin around it clean and dry. These areas may be cleansed with mild soap and water. Do not cleanse the area with peroxide, alcohol, or iodine.  When you remove an adhesive bandage, be sure not to damage the skin around it.  If you have a wound, look at it several times a day to make sure it is healing.  Do not use heating pads or hot water bottles. They may burn your skin. If you have lost feeling in your feet or legs, you may not know it is happening until it is too late.  Make sure your health care provider performs a complete foot exam at least annually or more often if you have foot problems. Report any cuts, sores, or bruises to your health care provider immediately. SEEK MEDICAL CARE IF:   You have an injury that is not healing.  You have cuts or breaks in the skin.  You have an ingrown nail.  You notice redness on your legs or feet.  You feel burning or tingling in your legs or feet.  You have pain or cramps in your legs and feet.  Your legs or feet are numb.  Your feet always feel cold. SEEK IMMEDIATE MEDICAL CARE IF:   There is increasing redness,   swelling, or pain in or around a wound.  There is a red line that goes up your leg.  Pus is coming from a wound.  You develop a fever or as directed by your health care provider.  You notice a bad smell coming from an ulcer or wound. Document Released: 01/24/2000 Document Revised: 09/28/2012 Document Reviewed: 07/05/2012 ExitCare Patient Information 2015 [employer]. This information is not intended to replace advice given to you by your health care provider. Make sure you discuss any questions you have with your health care provider.  

## 2014-05-25 ENCOUNTER — Other Ambulatory Visit: Payer: Self-pay | Admitting: Cardiovascular Disease

## 2014-05-25 DIAGNOSIS — I3139 Other pericardial effusion (noninflammatory): Secondary | ICD-10-CM

## 2014-05-25 DIAGNOSIS — I313 Pericardial effusion (noninflammatory): Secondary | ICD-10-CM

## 2014-06-14 ENCOUNTER — Other Ambulatory Visit (HOSPITAL_COMMUNITY): Payer: Medicare Other

## 2014-06-14 ENCOUNTER — Ambulatory Visit (HOSPITAL_COMMUNITY): Payer: Medicare Other | Attending: Internal Medicine

## 2014-06-14 DIAGNOSIS — I313 Pericardial effusion (noninflammatory): Secondary | ICD-10-CM | POA: Diagnosis not present

## 2014-06-14 DIAGNOSIS — I319 Disease of pericardium, unspecified: Secondary | ICD-10-CM | POA: Diagnosis not present

## 2014-06-14 DIAGNOSIS — I3139 Other pericardial effusion (noninflammatory): Secondary | ICD-10-CM

## 2014-06-18 ENCOUNTER — Other Ambulatory Visit: Payer: Self-pay | Admitting: Internal Medicine

## 2014-06-18 MED ORDER — OXYCODONE HCL 5 MG PO TABS: 5.0000 mg | ORAL_TABLET | Freq: Two times a day (BID) | ORAL | Status: DC | PRN

## 2014-06-18 NOTE — Addendum Note (Signed)
Addended by: Lowella Dandy on: 06/18/2014 05:02 PM   Modules accepted: Orders

## 2014-06-19 ENCOUNTER — Other Ambulatory Visit: Payer: Self-pay

## 2014-06-19 MED ORDER — OXYCODONE HCL 5 MG PO TABS: 5.0000 mg | ORAL_TABLET | Freq: Two times a day (BID) | ORAL | Status: DC | PRN

## 2014-06-21 ENCOUNTER — Encounter: Payer: Self-pay | Admitting: Cardiovascular Disease

## 2014-06-21 ENCOUNTER — Ambulatory Visit (INDEPENDENT_AMBULATORY_CARE_PROVIDER_SITE_OTHER): Payer: Medicare Other | Admitting: Cardiovascular Disease

## 2014-06-21 VITALS — BP 152/78 | HR 65 | Ht 74.0 in | Wt 198.0 lb

## 2014-06-21 DIAGNOSIS — I319 Disease of pericardium, unspecified: Secondary | ICD-10-CM

## 2014-06-21 DIAGNOSIS — I482 Chronic atrial fibrillation, unspecified: Secondary | ICD-10-CM

## 2014-06-21 DIAGNOSIS — I3139 Other pericardial effusion (noninflammatory): Secondary | ICD-10-CM

## 2014-06-21 DIAGNOSIS — I313 Pericardial effusion (noninflammatory): Secondary | ICD-10-CM

## 2014-06-21 LAB — RHEUMATOID FACTOR

## 2014-06-21 NOTE — Patient Instructions (Addendum)
Medication Instructions:  Your physician recommends that you continue on your current medications as directed. Please refer to the Current Medication list given to you today.   Labwork: Today:  ANA, Rheumatoid factor, ESR In 3 months:  BMET  Testing/Procedures: None Ordered   Follow-Up: Your physician recommends that you schedule a follow-up appointment in: 3 months with Dr. Acie Fredrickson

## 2014-06-21 NOTE — Progress Notes (Signed)
Cardiology Office Note   Date:  06/21/2014   ID:  Luis Glenn, DOB 1927/12/29, MRN [id number]  PCP:  Gwendolyn Grant, MD  Cardiologist:   Thayer Headings, MD   No chief complaint on file.     History of Present Illness: Luis Glenn is a 81 y.o. male who presents for follow up of his pericardial effusion.   He had an echo done last week that shows a persistent moderate sized pericardial effusion that may be slightly improved compared to the initial effusion He has had to back off the Colchicine to once a day because of diarrhea.  Now he' s having constipation and want to go back up on the dose.  He denies having any chest pain. He gets only occasional episodes of shortness breath when he  lies flat on his back. He is able to do all of his normal activities without significant difficulties.  Jun 21, 2014:  Luis Glenn is here to follow up with is large pericardial effusion  He is asymptomatic. Has has significant ankle edema for years.     Past Medical History  Diagnosis Date  . GOUT   . PERIPHERAL VASCULAR DISEASE   . DIVERTICULOSIS, COLON   . BENIGN PROSTATIC HYPERTROPHY   . DEPRESSION   . DIABETES MELLITUS, TYPE II   . GERD   . HYPERLIPIDEMIA   . HYPERTENSION   . CATARACTS   . OSTEOARTHRITIS   . ANXIETY   . ALLERGIC RHINITIS     Past Surgical History  Procedure Laterality Date  . Tonsillectomy  10/1942     Current Outpatient Prescriptions  Medication Sig Dispense Refill  . acetaminophen (TYLENOL) 500 MG tablet Take 500 mg by mouth at bedtime.      . ALPRAZolam (XANAX) 0.5 MG tablet take 1 tablet by mouth 3 times daily as needed for anxiety or sleep 90 tablet 0  . amLODipine-benazepril (LOTREL) 10-20 MG per capsule Take 1 capsule by mouth daily. 90 capsule 1  . apixaban (ELIQUIS) 2.5 MG TABS tablet Take 1 tablet (2.5 mg total) by mouth 2 (two) times daily. 60 tablet 11  . colchicine 0.6 MG tablet Take 1 tablet (0.6 mg total) by mouth 2 (two) times  daily. 61 tablet 11  . fluticasone (FLONASE) 50 MCG/ACT nasal spray Place 2 sprays into both nostrils daily. 16 g 6  . furosemide (LASIX) 40 MG tablet Take 1 tablet (40 mg total) by mouth 2 (two) times daily. 60 tablet 5  . oxyCODONE (OXY IR/ROXICODONE) 5 MG immediate release tablet Take 1 tablet (5 mg total) by mouth 2 (two) times daily as needed for severe pain. 60 tablet 0  . Potassium Chloride ER 20 MEQ TBCR TAKE 1 TABLET BY MOUTH DAILY 30 tablet 11  . simvastatin (ZOCOR) 40 MG tablet Take 1 tablet (40 mg total) by mouth daily at 6 PM. 90 tablet 1  . tamsulosin (FLOMAX) 0.4 MG CAPS capsule TAKE ONE CAPSULE BY MOUTH ONE TIME DAILY  30 capsule 5  . terazosin (HYTRIN) 10 MG capsule Take 1 capsule (10 mg total) by mouth daily. 90 capsule 1   No current facility-administered medications for this visit.    Allergies:   Penicillins    Social History:  The patient  reports that he quit smoking about 25 years ago. His smoking use included Cigarettes. He has a 120 pack-year smoking history. He does not have any smokeless tobacco history on file. He reports that he drinks alcohol. He  reports that he does not use illicit drugs.   Family History:  The patient's family history includes Alcohol abuse in his other; Breast cancer in his other; Diabetes in his father and mother; Hypertension in his father and mother.    ROS:  Please see the history of present illness.    Review of Systems: Constitutional:  denies fever, chills, diaphoresis, appetite change and fatigue.  HEENT: denies photophobia, eye pain, redness, hearing loss, ear pain, congestion, sore throat, rhinorrhea, sneezing, neck pain, neck stiffness and tinnitus.  Respiratory: denies SOB, DOE, cough, chest tightness, and wheezing.  Cardiovascular: denies chest pain, palpitations and leg swelling.  Gastrointestinal: denies nausea, vomiting, abdominal pain, diarrhea, constipation, blood in stool.  Genitourinary: denies dysuria, urgency,  frequency, hematuria, flank pain and difficulty urinating.  Musculoskeletal: denies  myalgias, back pain, joint swelling, arthralgias and gait problem.   Skin: denies pallor, rash and wound.  Neurological: denies dizziness, seizures, syncope, weakness, light-headedness, numbness and headaches.   Hematological: denies adenopathy, easy bruising, personal or family bleeding history.  Psychiatric/ Behavioral: denies suicidal ideation, mood changes, confusion, nervousness, sleep disturbance and agitation.       All other systems are reviewed and negative.    PHYSICAL EXAM: VS:  BP 152/78 mmHg  Pulse 49  Ht 6\' 2"  (1.88 m)  Wt 89.812 kg (198 lb)  BMI 25.41 kg/m2  SpO2 99% , BMI Body mass index is 25.41 kg/(m^2). GEN: elderly gentleman,   HEENT: normal Neck: no JVD, carotid bruits, or masses Cardiac: irreg. Irreg. ; no murmurs, rubs, or gallops,.  He has chronic 2 - 3+  leg edema. R>L  Respiratory:  clear to auscultation bilaterally, normal work of breathing GI: soft, nontender, nondistended, + BS MS: no deformity or atrophy Skin: warm and dry, no rash Neuro:  Strength and sensation are intact Psych: normal   EKG:  EKG is not ordered today.    Recent Labs: 10/10/2013: ALT 21 01/24/2014: Hemoglobin 12.1*; Platelets 245.0 03/20/2014: BUN 42*; Creatinine 1.58*; Potassium 4.1; Sodium 141    Lipid Panel    Component Value Date/Time   CHOL 116 09/18/2013 1402   TRIG 41.0 09/18/2013 1402   TRIG 81 01/10/2008   HDL 61.50 09/18/2013 1402   CHOLHDL 2 09/18/2013 1402   VLDL 8.2 09/18/2013 1402   LDLCALC 46 09/18/2013 1402      Wt Readings from Last 3 Encounters:  06/21/14 89.812 kg (198 lb)  05/07/14 91.627 kg (202 lb)  03/20/14 91.627 kg (202 lb)      Other studies Reviewed: Additional studies/ records that were reviewed today include: . Review of the above records demonstrates:    ASSESSMENT AND PLAN:  1. Pericardial effusion - his pericardial effusion persists. We  tried him on Motrin and colchicine for quite a long time and the pericardial effusion with has not decreased in size. We'll check labs today including a sedimentation rate, AMA, rheumatoid factor. I have not got the CT of the chest.  We will send his chart to one of the cardiothoracic surgeons to get their opinion on doing a pericardial window. I discussed the possibility of a pericardial window with Luis Glenn and he would be willing to consider it .   2. Gout  3. Hypertension - his blood pressure remains well-controlled.  4. Atrial fibrillation- and tinged of atrial fibrillation. He's on Eliquis   5. Hyperlipidemia- followed by his medical doctor  6. Moderate pulmonary hypertension  7. Moderate aortic insufficiency  8. Weight loss  Current medicines are reviewed at length with the patient today.  The patient does not have concerns regarding medicines.  The following changes have been made:  colchicine to 0.6 mg BID   Disposition:   FU with me in 3 months     Signed, Nahser, Wonda Cheng, MD  06/21/2014 4:02 PM    [facility] [place], [place], [place]  [address] Phone: [phone]; Fax: [phone]

## 2014-06-22 LAB — ANA: ANA: NEGATIVE

## 2014-06-22 LAB — SEDIMENTATION RATE: SED RATE: 25 mm/h — AB (ref 0–22)

## 2014-06-25 ENCOUNTER — Other Ambulatory Visit: Payer: Self-pay | Admitting: Internal Medicine

## 2014-06-25 ENCOUNTER — Telehealth: Payer: Self-pay | Admitting: Nurse Practitioner

## 2014-06-25 DIAGNOSIS — I313 Pericardial effusion (noninflammatory): Secondary | ICD-10-CM

## 2014-06-25 DIAGNOSIS — I3139 Other pericardial effusion (noninflammatory): Secondary | ICD-10-CM

## 2014-06-25 DIAGNOSIS — J984 Other disorders of lung: Secondary | ICD-10-CM

## 2014-06-25 MED ORDER — ALPRAZOLAM 0.5 MG PO TABS: 0.5000 mg | ORAL_TABLET | Freq: Three times a day (TID) | ORAL | Status: DC | PRN

## 2014-06-25 NOTE — Addendum Note (Signed)
Addended by: Gwendolyn Grant A on: 06/25/2014 01:00 PM   Modules accepted: Orders

## 2014-06-25 NOTE — Telephone Encounter (Signed)
Results and plan of care reviewed with patient.  I advised him that he will get a call to set up scheduling of the CT and we will call him with the results.  Patient verbalized understanding and agreement.

## 2014-06-25 NOTE — Telephone Encounter (Signed)
-----  Message from Thayer Headings, MD sent at 06/22/2014  8:55 AM EDT ----- Rheumatoid factor is negative  Still waiting on ESR and ANA

## 2014-06-26 ENCOUNTER — Telehealth: Payer: Self-pay | Admitting: Internal Medicine

## 2014-06-26 NOTE — Telephone Encounter (Signed)
[facility] called regarding this refill

## 2014-06-26 NOTE — Telephone Encounter (Signed)
I faxed the rx like 6 times and it kept saying busy. I called the pharmacy and gave rx information over the phone.   FYI: pt may call in regarding. Let him know that I spoke directly with the pharmacist.

## 2014-07-02 ENCOUNTER — Other Ambulatory Visit: Payer: Self-pay | Admitting: Internal Medicine

## 2014-07-03 ENCOUNTER — Other Ambulatory Visit (INDEPENDENT_AMBULATORY_CARE_PROVIDER_SITE_OTHER): Payer: Medicare Other

## 2014-07-03 ENCOUNTER — Other Ambulatory Visit: Payer: Self-pay | Admitting: *Deleted

## 2014-07-03 DIAGNOSIS — I3139 Other pericardial effusion (noninflammatory): Secondary | ICD-10-CM

## 2014-07-03 DIAGNOSIS — I313 Pericardial effusion (noninflammatory): Secondary | ICD-10-CM

## 2014-07-03 DIAGNOSIS — I319 Disease of pericardium, unspecified: Secondary | ICD-10-CM

## 2014-07-03 LAB — BASIC METABOLIC PANEL
BUN: 40 mg/dL — AB (ref 6–23)
CALCIUM: 9.3 mg/dL (ref 8.4–10.5)
CO2: 26 mEq/L (ref 19–32)
CREATININE: 1.85 mg/dL — AB (ref 0.40–1.50)
Chloride: 104 mEq/L (ref 96–112)
GFR: 37.11 mL/min — AB (ref >60.00)
Glucose, Bld: 157 mg/dL — ABNORMAL HIGH (ref 70–99)
Potassium: 4.6 mEq/L (ref 3.5–5.1)
SODIUM: 138 meq/L (ref 135–145)

## 2014-07-04 ENCOUNTER — Other Ambulatory Visit: Payer: Medicare Other

## 2014-07-11 ENCOUNTER — Ambulatory Visit (INDEPENDENT_AMBULATORY_CARE_PROVIDER_SITE_OTHER)
Admission: RE | Admit: 2014-07-11 | Discharge: 2014-07-11 | Disposition: A | Discharge status: Home / Self Care | Payer: Medicare Other | Source: Ambulatory Visit | Attending: Cardiovascular Disease | Admitting: Cardiovascular Disease

## 2014-07-11 DIAGNOSIS — J984 Other disorders of lung: Secondary | ICD-10-CM

## 2014-07-11 DIAGNOSIS — J432 Centrilobular emphysema: Secondary | ICD-10-CM | POA: Diagnosis not present

## 2014-07-11 DIAGNOSIS — R911 Solitary pulmonary nodule: Secondary | ICD-10-CM | POA: Diagnosis not present

## 2014-07-30 ENCOUNTER — Other Ambulatory Visit: Payer: Self-pay | Admitting: Internal Medicine

## 2014-08-03 ENCOUNTER — Encounter: Payer: Self-pay | Admitting: Cardiovascular Disease

## 2014-08-03 ENCOUNTER — Other Ambulatory Visit: Payer: Self-pay | Admitting: Nurse Practitioner

## 2014-08-06 ENCOUNTER — Other Ambulatory Visit: Payer: Self-pay

## 2014-08-08 ENCOUNTER — Telehealth: Payer: Self-pay

## 2014-08-08 NOTE — Telephone Encounter (Signed)
rx request for: allopurinol 300 mg.   Not currently active. Is this okay to fill?   [place].

## 2014-08-09 MED ORDER — ALLOPURINOL 300 MG PO TABS: 300.0000 mg | ORAL_TABLET | Freq: Every day | ORAL | Status: DC

## 2014-08-09 NOTE — Telephone Encounter (Signed)
Done. thanks

## 2014-08-14 ENCOUNTER — Encounter: Payer: Self-pay | Admitting: Internal Medicine

## 2014-08-15 NOTE — Telephone Encounter (Signed)
LVM for pt to call back as soon as possible.   RE: PCP has some questions to ask pt for clarification.

## 2014-08-20 ENCOUNTER — Ambulatory Visit: Payer: Medicare Other | Admitting: Podiatry

## 2014-08-22 ENCOUNTER — Encounter: Payer: Self-pay | Admitting: Podiatry

## 2014-08-22 ENCOUNTER — Ambulatory Visit (INDEPENDENT_AMBULATORY_CARE_PROVIDER_SITE_OTHER): Payer: Medicare Other | Admitting: Podiatry

## 2014-08-22 VITALS — BP 127/54 | HR 60 | Temp 97.9°F | Resp 14

## 2014-08-22 DIAGNOSIS — E1342 Other specified diabetes mellitus with diabetic polyneuropathy: Secondary | ICD-10-CM | POA: Diagnosis not present

## 2014-08-22 DIAGNOSIS — G629 Polyneuropathy, unspecified: Secondary | ICD-10-CM

## 2014-08-22 DIAGNOSIS — B351 Tinea unguium: Secondary | ICD-10-CM | POA: Diagnosis not present

## 2014-08-22 DIAGNOSIS — M79676 Pain in unspecified toe(s): Secondary | ICD-10-CM | POA: Diagnosis not present

## 2014-08-22 DIAGNOSIS — Q828 Other specified congenital malformations of skin: Secondary | ICD-10-CM

## 2014-08-22 DIAGNOSIS — E1142 Type 2 diabetes mellitus with diabetic polyneuropathy: Secondary | ICD-10-CM

## 2014-08-22 NOTE — Patient Instructions (Signed)
Diabetes and Foot Care Diabetes may cause you to have problems because of poor blood supply (circulation) to your feet and legs. This may cause the skin on your feet to become thinner, break easier, and heal more slowly. Your skin may become dry, and the skin may peel and crack. You may also have nerve damage in your legs and feet causing decreased feeling in them. You may not notice minor injuries to your feet that could lead to infections or more serious problems. Taking care of your feet is one of the most important things you can do for yourself.  HOME CARE INSTRUCTIONS  Wear shoes at all times, even in the house. Do not go barefoot. Bare feet are easily injured.  Check your feet daily for blisters, cuts, and redness. If you cannot see the bottom of your feet, use a mirror or ask someone for help.  Wash your feet with warm water (do not use hot water) and mild soap. Then pat your feet and the areas between your toes until they are completely dry. Do not soak your feet as this can dry your skin.  Apply a moisturizing lotion or petroleum jelly (that does not contain alcohol and is unscented) to the skin on your feet and to dry, brittle toenails. Do not apply lotion between your toes.  Trim your toenails straight across. Do not dig under them or around the cuticle. File the edges of your nails with an emery board or nail file.  Do not cut corns or calluses or try to remove them with medicine.  Wear clean socks or stockings every day. Make sure they are not too tight. Do not wear knee-high stockings since they may decrease blood flow to your legs.  Wear shoes that fit properly and have enough cushioning. To break in new shoes, wear them for just a few hours a day. This prevents you from injuring your feet. Always look in your shoes before you put them on to be sure there are no objects inside.  Do not cross your legs. This may decrease the blood flow to your feet.  If you find a minor scrape,  cut, or break in the skin on your feet, keep it and the skin around it clean and dry. These areas may be cleansed with mild soap and water. Do not cleanse the area with peroxide, alcohol, or iodine.  When you remove an adhesive bandage, be sure not to damage the skin around it.  If you have a wound, look at it several times a day to make sure it is healing.  Do not use heating pads or hot water bottles. They may burn your skin. If you have lost feeling in your feet or legs, you may not know it is happening until it is too late.  Make sure your health care provider performs a complete foot exam at least annually or more often if you have foot problems. Report any cuts, sores, or bruises to your health care provider immediately. SEEK MEDICAL CARE IF:   You have an injury that is not healing.  You have cuts or breaks in the skin.  You have an ingrown nail.  You notice redness on your legs or feet.  You feel burning or tingling in your legs or feet.  You have pain or cramps in your legs and feet.  Your legs or feet are numb.  Your feet always feel cold. SEEK IMMEDIATE MEDICAL CARE IF:   There is increasing redness,   swelling, or pain in or around a wound.  There is a red line that goes up your leg.  Pus is coming from a wound.  You develop a fever or as directed by your health care provider.  You notice a bad smell coming from an ulcer or wound. Document Released: 01/24/2000 Document Revised: 09/28/2012 Document Reviewed: 07/05/2012 ExitCare Patient Information 2015 [employer]. This information is not intended to replace advice given to you by your health care provider. Make sure you discuss any questions you have with your health care provider.  

## 2014-08-23 ENCOUNTER — Telehealth: Payer: Self-pay | Admitting: Internal Medicine

## 2014-08-23 ENCOUNTER — Other Ambulatory Visit: Payer: Self-pay | Admitting: Internal Medicine

## 2014-08-23 NOTE — Progress Notes (Signed)
Patient ID: Luis Glenn, male   DOB: 1927/09/21, 81 y.o.   MRN: [id number]  Subjective: This patient presents for scheduled visit complaining of painful toenails and keratoses and request debridement  Objective: The toenails are hypertrophic, elongated, incurvated, discolored and tender to direct palpation 6-10 Nucleated plantar keratoses second MPJ bilaterally No open skin lesions noted bilaterally  Assessment: Diabetic with a history of neuropathy and angiopathy Symptomatic onychomycoses 6-10 Porokeratosis 2  Plan: Debridement of toenails mechanically electrically without any bleeding Debrided plantar keratoses 2 without any bleeding  Reappoint 3 month

## 2014-08-24 MED ORDER — OXYCODONE HCL 5 MG PO TABS: 5.0000 mg | ORAL_TABLET | Freq: Two times a day (BID) | ORAL | Status: DC | PRN

## 2014-08-24 NOTE — Telephone Encounter (Signed)
Pt.notified

## 2014-08-24 NOTE — Telephone Encounter (Signed)
Printed and signed, needs UDS at pickup.

## 2014-08-24 NOTE — Addendum Note (Signed)
Addended by: Vertell Novak A on: 08/24/2014 10:38 AM   Modules accepted: Orders

## 2014-09-11 ENCOUNTER — Other Ambulatory Visit: Payer: Self-pay | Admitting: Internal Medicine

## 2014-09-12 ENCOUNTER — Encounter: Payer: Self-pay | Admitting: Cardiovascular Disease

## 2014-09-12 ENCOUNTER — Ambulatory Visit (INDEPENDENT_AMBULATORY_CARE_PROVIDER_SITE_OTHER): Payer: Medicare Other | Admitting: Cardiovascular Disease

## 2014-09-12 VITALS — BP 141/59 | HR 77 | Ht 74.0 in | Wt 202.1 lb

## 2014-09-12 DIAGNOSIS — I3139 Other pericardial effusion (noninflammatory): Secondary | ICD-10-CM

## 2014-09-12 DIAGNOSIS — I319 Disease of pericardium, unspecified: Secondary | ICD-10-CM | POA: Diagnosis not present

## 2014-09-12 DIAGNOSIS — I313 Pericardial effusion (noninflammatory): Secondary | ICD-10-CM

## 2014-09-12 DIAGNOSIS — I482 Chronic atrial fibrillation, unspecified: Secondary | ICD-10-CM

## 2014-09-12 LAB — BASIC METABOLIC PANEL
BUN: 44 mg/dL — ABNORMAL HIGH (ref 6–23)
CALCIUM: 9.4 mg/dL (ref 8.4–10.5)
CHLORIDE: 108 meq/L (ref 96–112)
CO2: 24 meq/L (ref 19–32)
CREATININE: 1.72 mg/dL — AB (ref 0.40–1.50)
GFR: 40.34 mL/min — ABNORMAL LOW (ref >60.00)
Glucose, Bld: 107 mg/dL — ABNORMAL HIGH (ref 70–99)
Potassium: 4.5 mEq/L (ref 3.5–5.1)
Sodium: 140 mEq/L (ref 135–145)

## 2014-09-12 NOTE — Patient Instructions (Addendum)
Medication Instructions:  Your physician recommends that you continue on your current medications as directed. Please refer to the Current Medication list given to you today.  Labwork: TODAY - basic metabolic panel  Testing/Procedures: None Ordered  Follow-Up: Your physician wants you to follow-up in: 6 months with Dr. Acie Fredrickson.  You will receive a reminder letter in the mail two months in advance. If you don't receive a letter, please call our office to schedule the follow-up appointment.    For your  leg edema you  should do  the following 1. Leg elevation - I recommend the Lounge Dr. Leg rest.  See below for details  2. Salt restriction  -  Use potassium chloride instead of regular salt as a salt substitute. 3. Walk regularly 4. Compression hose - [facility] 5. Weight loss     Go to [email or web address]

## 2014-09-12 NOTE — Progress Notes (Signed)
Cardiology Office Note   Date:  09/12/2014   ID:  Luis Glenn, DOB 02-23-1927, MRN [id number]  PCP:  Gwendolyn Grant, MD  Cardiologist:   Thayer Headings, MD   Chief Complaint  Patient presents with  . Follow-up    pericardial effusion      History of Present Illness: Luis Glenn is a 81 y.o. male who presents for follow up of his pericardial effusion.   He had an echo done last week that shows a persistent moderate sized pericardial effusion that may be slightly improved compared to the initial effusion He has had to back off the Colchicine to once a day because of diarrhea.  Now he' s having constipation and want to go back up on the dose.  He denies having any chest pain. He gets only occasional episodes of shortness breath when he  lies flat on his back. He is able to do all of his normal activities without significant difficulties.  Jun 21, 2014:  Luis Glenn is here to follow up with is large pericardial effusion  He is asymptomatic. Has has significant ankle edema for years.   Aug. 3, 2016  Doing ok Has occasional episodes of dyspnea. Has Swelling in his legs. Is not eating on salt.   Past Medical History  Diagnosis Date  . GOUT   . PERIPHERAL VASCULAR DISEASE   . DIVERTICULOSIS, COLON   . BENIGN PROSTATIC HYPERTROPHY   . DEPRESSION   . DIABETES MELLITUS, TYPE II   . GERD   . HYPERLIPIDEMIA   . HYPERTENSION   . CATARACTS   . OSTEOARTHRITIS   . ANXIETY   . ALLERGIC RHINITIS     Past Surgical History  Procedure Laterality Date  . Tonsillectomy  10/1942     Current Outpatient Prescriptions  Medication Sig Dispense Refill  . acetaminophen (TYLENOL) 500 MG tablet Take 500 mg by mouth at bedtime.      Marland Kitchen allopurinol (ZYLOPRIM) 300 MG tablet Take 1 tablet (300 mg total) by mouth daily. 30 tablet 6  . ALPRAZolam (XANAX) 0.5 MG tablet Take 1 tablet (0.5 mg total) by mouth 3 (three) times daily as needed for anxiety. 90 tablet 5  .  amLODipine-benazepril (LOTREL) 10-20 MG per capsule TAKE 1 CAPSULE BY MOUTH DAILY 90 capsule 3  . apixaban (ELIQUIS) 2.5 MG TABS tablet Take 1 tablet (2.5 mg total) by mouth 2 (two) times daily. 60 tablet 11  . fluticasone (FLONASE) 50 MCG/ACT nasal spray Place 2 sprays into both nostrils daily. 16 g 6  . furosemide (LASIX) 40 MG tablet Take 1 tablet (40 mg total) by mouth 2 (two) times daily. 180 tablet 3  . oxyCODONE (OXY IR/ROXICODONE) 5 MG immediate release tablet Take 1 tablet (5 mg total) by mouth 2 (two) times daily as needed for severe pain. 60 tablet 0  . Potassium Chloride ER 20 MEQ TBCR TAKE 1 TABLET BY MOUTH DAILY 30 tablet 11  . simvastatin (ZOCOR) 40 MG tablet Take 1 tablet (40 mg total) by mouth daily at 6 PM. 90 tablet 1  . tamsulosin (FLOMAX) 0.4 MG CAPS capsule TAKE ONE CAPSULE BY MOUTH DAILY 90 capsule 0  . terazosin (HYTRIN) 10 MG capsule Take 1 capsule (10 mg total) by mouth daily. 90 capsule 0   No current facility-administered medications for this visit.    Allergies:   Penicillins    Social History:  The patient  reports that he quit smoking about 25 years ago. His  smoking use included Cigarettes. He has a 120 pack-year smoking history. He does not have any smokeless tobacco history on file. He reports that he drinks alcohol. He reports that he does not use illicit drugs.   Family History:  The patient's family history includes Alcohol abuse in his other; Breast cancer in his other; Diabetes in his father and mother; Hypertension in his father and mother.    ROS:  Please see the history of present illness.    Review of Systems: Constitutional:  denies fever, chills, diaphoresis, appetite change and fatigue.  HEENT: denies photophobia, eye pain, redness, hearing loss, ear pain, congestion, sore throat, rhinorrhea, sneezing, neck pain, neck stiffness and tinnitus.  Respiratory: denies SOB, DOE, cough, chest tightness, and wheezing.  Cardiovascular: denies chest pain,  palpitations and leg swelling.  Gastrointestinal: denies nausea, vomiting, abdominal pain, diarrhea, constipation, blood in stool.  Genitourinary: denies dysuria, urgency, frequency, hematuria, flank pain and difficulty urinating.  Musculoskeletal: denies  myalgias, back pain, joint swelling, arthralgias and gait problem.   Skin: denies pallor, rash and wound.  Neurological: denies dizziness, seizures, syncope, weakness, light-headedness, numbness and headaches.   Hematological: denies adenopathy, easy bruising, personal or family bleeding history.  Psychiatric/ Behavioral: denies suicidal ideation, mood changes, confusion, nervousness, sleep disturbance and agitation.       All other systems are reviewed and negative.    PHYSICAL EXAM: VS:  BP 141/59 mmHg  Pulse 77  Ht 6\' 2"  (1.88 m)  Wt 91.681 kg (202 lb 1.9 oz)  BMI 25.94 kg/m2  SpO2 96% , BMI Body mass index is 25.94 kg/(m^2). GEN: elderly gentleman,   HEENT: normal Neck: no JVD, carotid bruits, or masses Cardiac: irreg. Irreg. ; no murmurs, rubs, or gallops,.  He has chronic 2 - 3+  leg edema. R>L  Respiratory:  clear to auscultation bilaterally, normal work of breathing GI: soft, nontender, nondistended, + BS MS: no deformity or atrophy Skin: warm and dry, no rash Neuro:  Strength and sensation are intact Psych: normal   EKG:  EKG is not ordered today.    Recent Labs: 10/10/2013: ALT 21 01/24/2014: Hemoglobin 12.1*; Platelets 245.0 07/03/2014: BUN 40*; Creatinine, Ser 1.85*; Potassium 4.6; Sodium 138    Lipid Panel    Component Value Date/Time   CHOL 116 09/18/2013 1402   TRIG 41.0 09/18/2013 1402   TRIG 81 01/10/2008   HDL 61.50 09/18/2013 1402   CHOLHDL 2 09/18/2013 1402   VLDL 8.2 09/18/2013 1402   LDLCALC 46 09/18/2013 1402      Wt Readings from Last 3 Encounters:  09/12/14 91.681 kg (202 lb 1.9 oz)  06/21/14 89.812 kg (198 lb)  05/07/14 91.627 kg (202 lb)      ECG:  Reviewed by me:  ATrial fib  at 77  . Ventricular rate of 77  ASSESSMENT AND PLAN:  1. Pericardial effusion - he has a chronic large pericardial effusion. A CT scan reveals no evidence of lung cancer. We've treated him for many months with colchicine and Motrin but the pericardial effusion has not changed. He has stopped the colchicine ( at our recommendation)  . Will check BMP today   2. Gout  3. Hypertension - his blood pressure remains well-controlled.  4. Atrial fibrillation- Hx  of atrial fibrillation. He's on Eliquis   5. Hyperlipidemia- followed by his medical doctor  6. Moderate pulmonary hypertension  7. Moderate aortic insufficiency  8. Weight loss    Current medicines are reviewed at length with the patient  today.  The patient does not have concerns regarding medicines.  The following changes have been made:      Disposition:   FU with me in 6  months     Signed, Nahser, Wonda Cheng, MD  09/12/2014 2:45 PM    [facility] [place], [place], [place]  [address] Phone: [phone]; Fax: [phone]

## 2014-09-20 ENCOUNTER — Telehealth: Payer: Self-pay | Admitting: Internal Medicine

## 2014-09-20 MED ORDER — OXYCODONE HCL 5 MG PO TABS: 5.0000 mg | ORAL_TABLET | Freq: Two times a day (BID) | ORAL | Status: DC | PRN

## 2014-09-20 NOTE — Telephone Encounter (Signed)
Medication refilled and dated.

## 2014-09-20 NOTE — Addendum Note (Signed)
Addended by: Mauricio Po D on: 09/20/2014 02:27 PM   Modules accepted: Orders

## 2014-09-25 ENCOUNTER — Other Ambulatory Visit: Payer: Self-pay | Admitting: Internal Medicine

## 2014-10-17 ENCOUNTER — Ambulatory Visit (INDEPENDENT_AMBULATORY_CARE_PROVIDER_SITE_OTHER): Payer: Medicare Other | Admitting: Internal Medicine

## 2014-10-17 ENCOUNTER — Encounter: Payer: Self-pay | Admitting: Internal Medicine

## 2014-10-17 ENCOUNTER — Other Ambulatory Visit (INDEPENDENT_AMBULATORY_CARE_PROVIDER_SITE_OTHER): Payer: Medicare Other

## 2014-10-17 VITALS — BP 160/74 | HR 82 | Temp 97.8°F | Resp 16 | Wt 200.0 lb

## 2014-10-17 DIAGNOSIS — R5383 Other fatigue: Secondary | ICD-10-CM | POA: Diagnosis not present

## 2014-10-17 DIAGNOSIS — I10 Essential (primary) hypertension: Secondary | ICD-10-CM

## 2014-10-17 DIAGNOSIS — R630 Anorexia: Secondary | ICD-10-CM

## 2014-10-17 DIAGNOSIS — N289 Disorder of kidney and ureter, unspecified: Secondary | ICD-10-CM

## 2014-10-17 DIAGNOSIS — R140 Abdominal distension (gaseous): Secondary | ICD-10-CM

## 2014-10-17 DIAGNOSIS — R351 Nocturia: Secondary | ICD-10-CM

## 2014-10-17 DIAGNOSIS — R739 Hyperglycemia, unspecified: Secondary | ICD-10-CM

## 2014-10-17 LAB — URINALYSIS, ROUTINE W REFLEX MICROSCOPIC
Bilirubin Urine: NEGATIVE
Hgb urine dipstick: NEGATIVE
Ketones, ur: NEGATIVE
Leukocytes, UA: NEGATIVE
Nitrite: NEGATIVE
PH: 5.5 (ref 5.0–8.0)
SPECIFIC GRAVITY, URINE: 1.015 (ref 1.000–1.030)
TOTAL PROTEIN, URINE-UPE24: 30 — AB
UROBILINOGEN UA: 1 (ref 0.0–1.0)
Urine Glucose: NEGATIVE

## 2014-10-17 LAB — CBC WITH DIFFERENTIAL/PLATELET
BASOS PCT: 0.3 % (ref 0.0–3.0)
Basophils Absolute: 0 10*3/uL (ref 0.0–0.1)
EOS ABS: 0.2 10*3/uL (ref 0.0–0.7)
Eosinophils Relative: 3.4 % (ref 0.0–5.0)
HEMATOCRIT: 35.5 % — AB (ref 39.0–52.0)
Hemoglobin: 11.7 g/dL — ABNORMAL LOW (ref 13.0–17.0)
LYMPHS ABS: 1 10*3/uL (ref 0.7–4.0)
Lymphocytes Relative: 15.7 % (ref 12.0–46.0)
MCHC: 32.8 g/dL (ref 30.0–36.0)
MCV: 91 fl (ref 78.0–100.0)
Monocytes Absolute: 0.3 10*3/uL (ref 0.1–1.0)
Monocytes Relative: 5.6 % (ref 3.0–12.0)
NEUTROS ABS: 4.6 10*3/uL (ref 1.4–7.7)
NEUTROS PCT: 75 % (ref 43.0–77.0)
PLATELETS: 223 10*3/uL (ref 150.0–400.0)
RBC: 3.9 Mil/uL — ABNORMAL LOW (ref 4.22–5.81)
RDW: 16.5 % — AB (ref 11.5–15.5)
WBC: 6.2 10*3/uL (ref 4.0–10.5)

## 2014-10-17 LAB — HEPATIC FUNCTION PANEL
ALK PHOS: 107 U/L (ref 39–117)
ALT: 7 U/L (ref 0–53)
AST: 14 U/L (ref 0–37)
Albumin: 4.3 g/dL (ref 3.5–5.2)
Bilirubin, Direct: 0.5 mg/dL — ABNORMAL HIGH (ref 0.0–0.3)
TOTAL PROTEIN: 7 g/dL (ref 6.0–8.3)
Total Bilirubin: 1.1 mg/dL (ref 0.2–1.2)

## 2014-10-17 LAB — HEMOGLOBIN A1C: HEMOGLOBIN A1C: 4.8 % (ref 4.6–6.5)

## 2014-10-17 LAB — BASIC METABOLIC PANEL
BUN: 27 mg/dL — ABNORMAL HIGH (ref 6–23)
CHLORIDE: 106 meq/L (ref 96–112)
CO2: 24 meq/L (ref 19–32)
Calcium: 9.7 mg/dL (ref 8.4–10.5)
Creatinine, Ser: 1.69 mg/dL — ABNORMAL HIGH (ref 0.40–1.50)
GFR: 41.16 mL/min — ABNORMAL LOW (ref >60.00)
Glucose, Bld: 109 mg/dL — ABNORMAL HIGH (ref 70–99)
Potassium: 3.9 mEq/L (ref 3.5–5.1)
Sodium: 142 mEq/L (ref 135–145)

## 2014-10-17 LAB — TSH: TSH: 1.62 u[IU]/mL (ref 0.35–4.50)

## 2014-10-17 NOTE — Progress Notes (Signed)
Pre visit review using our clinic review tool, if applicable. No additional management support is needed unless otherwise documented below in the visit note. 

## 2014-10-17 NOTE — Progress Notes (Signed)
   Subjective:    Patient ID: Luis Glenn, male    DOB: 10-04-1927, 81 y.o.   MRN: [id number]  HPI   He had constipation beginning 10-14 days ago. This resolved after 3 days with MiraLAX. He's had persistent nausea and bloating since. Solid food aggravates the bloating. There is no dysphagia per se.  He also has anorexia and fatigue. The history is somewhat vague as to consistency of stools ;they may be looser since constipation resolved. There is no frank diarrhea. He also has no clay colored stool or cola-colored urine. He has a history of irritable bowel syndrome.Last colonoscopy was 2002.  He does have nocturia 3 as well as urgency. Despite the urgency he describes some oliguria. He has a history of prostatic hypertrophy.   He has a history of diabetes but serial A1c's were in the range of 5.2. Because of this metformin was discontinued.  His last thyroid function test was in November 2014 with a value of 2.36.  Creatinine was 1.72; BUN 44; and GFR 40.34 on 09/12/14.  CBC revealed mild anemia with hemoglobin of 12.1 and hematocrit of 36.8 on 01/24/14.  Review of Systems Unexplained weight loss, abdominal pain, significant dyspepsia,melena, rectal bleeding, or persistently small caliber stools are denied.   There is no significant cough, sputum production,hemoptysis, wheezing,or  paroxysmal nocturnal dyspnea.  Dysuria, pyuria, hematuria, frequency, or polyuria are denied.     Objective:   Physical Exam  Pertinent or positive findings include: He appears disheveled. He has an unkempt beard and mustache. Pattern baldness is present. Ptosis of eyelids is noted. Nasal septum is deviated to the right. Teeth are stained. Abdomen is protuberant but soft. Pedal pulses are decreased due to severe edema. There is thickening over the skin of the shins of lower extremities. He has 1+ edema. There is flexion of the fifth right finger. Testes are atrophic. There is large varices on the left  scrotum. Prostate is not enlarged; there is no induration or nodularity.  General appearance :adequately nourished; in no distress.  Eyes: No conjunctival inflammation or scleral icterus is present.  Oral exam:  Lips and gums are healthy appearing.There is no oropharyngeal erythema or exudate noted.   Heart:  Normal rate and regular rhythm. S1 and S2 normal without gallop, murmur, click, rub or other extra sounds    Lungs:Chest clear to auscultation; no wheezes, rhonchi,rales ,or rubs present.No increased work of breathing.   Abdomen: bowel sounds normal,  non-tender without masses, organomegaly or hernias noted.  No guarding or rebound.   Vascular : all pulses equal ; no bruits present.  Skin:Warm & dry.  Intact without suspicious lesions or rashes ; no tenting or jaundice   Lymphatic: No lymphadenopathy is noted about the head, neck, axilla, or inguinal areas.   Neuro: Strength, tone decreased; DTRs normal.   .     Assessment & Plan:  #1 bloating    #2 anorexia  #3 fatigue  #4 renal insufficiency  #5 nocturia  #6 history of irritable bowel syndrome  #7 history of hyperglycemia with normal A1c  Plan: See orders and recommendations

## 2014-10-17 NOTE — Patient Instructions (Addendum)
Please take a probiotic , Florastor OR Align, every day if the bowels are loose. This may help bloating also.This will replace the normal bacteria which  are necessary for formation of normal stool and processing of food.  Minimal Blood Pressure Goal= AVERAGE < 140/90;  Ideal is an AVERAGE < 135/85. This AVERAGE should be calculated from @ least 5-7 BP readings taken @ different times of day on different days of week. You should not respond to isolated BP readings , but rather the AVERAGE for that week .Please bring your  blood pressure cuff to office visits to verify that it is reliable.It  can also be checked against the blood pressure device at the pharmacy. Finger or wrist cuffs are not dependable; an arm cuff is.   Your next office appointment will be determined based upon review of your pending labs  and  xrays  Those written interpretation of the lab results and instructions will be transmitted to you by My Chart  Critical results will be called.   Followup as needed for any active or acute issue. Please report any significant change in your symptoms.

## 2014-10-18 ENCOUNTER — Other Ambulatory Visit: Payer: Self-pay | Admitting: Internal Medicine

## 2014-10-18 DIAGNOSIS — D649 Anemia, unspecified: Secondary | ICD-10-CM | POA: Insufficient documentation

## 2014-10-23 ENCOUNTER — Encounter: Payer: Self-pay | Admitting: Internal Medicine

## 2014-10-29 ENCOUNTER — Telehealth: Payer: Self-pay | Admitting: Family

## 2014-10-29 ENCOUNTER — Encounter: Payer: Self-pay | Admitting: Internal Medicine

## 2014-10-29 MED ORDER — OXYCODONE HCL 5 MG PO TABS
5.0000 mg | ORAL_TABLET | Freq: Two times a day (BID) | ORAL | Status: DC | PRN
Start: 2014-10-29 — End: 2014-12-14

## 2014-10-29 NOTE — Telephone Encounter (Signed)
Please advise 

## 2014-10-29 NOTE — Addendum Note (Signed)
Addended by: Vertell Novak A on: 10/29/2014 04:16 PM   Modules accepted: Orders

## 2014-10-29 NOTE — Telephone Encounter (Signed)
Printed and signed.  

## 2014-10-30 ENCOUNTER — Other Ambulatory Visit: Payer: Self-pay | Admitting: Internal Medicine

## 2014-10-30 NOTE — Telephone Encounter (Signed)
Called and informed pt.  

## 2014-11-01 ENCOUNTER — Other Ambulatory Visit (INDEPENDENT_AMBULATORY_CARE_PROVIDER_SITE_OTHER): Payer: Medicare Other

## 2014-11-01 DIAGNOSIS — D649 Anemia, unspecified: Secondary | ICD-10-CM

## 2014-11-01 LAB — CBC WITH DIFFERENTIAL/PLATELET
BASOS ABS: 0 10*3/uL (ref 0.0–0.1)
Basophils Relative: 0.3 % (ref 0.0–3.0)
EOS PCT: 2.5 % (ref 0.0–5.0)
Eosinophils Absolute: 0.2 10*3/uL (ref 0.0–0.7)
HCT: 33.6 % — ABNORMAL LOW (ref 39.0–52.0)
HEMOGLOBIN: 11 g/dL — AB (ref 13.0–17.0)
Lymphocytes Relative: 13.2 % (ref 12.0–46.0)
Lymphs Abs: 1 10*3/uL (ref 0.7–4.0)
MCHC: 32.8 g/dL (ref 30.0–36.0)
MCV: 90.4 fl (ref 78.0–100.0)
MONOS PCT: 4.6 % (ref 3.0–12.0)
Monocytes Absolute: 0.4 10*3/uL (ref 0.1–1.0)
Neutro Abs: 6.2 10*3/uL (ref 1.4–7.7)
Neutrophils Relative %: 79.4 % — ABNORMAL HIGH (ref 43.0–77.0)
Platelets: 220 10*3/uL (ref 150.0–400.0)
RBC: 3.72 Mil/uL — AB (ref 4.22–5.81)
RDW: 16.3 % — ABNORMAL HIGH (ref 11.5–15.5)
WBC: 7.8 10*3/uL (ref 4.0–10.5)

## 2014-11-01 LAB — IBC PANEL
Iron: 60 ug/dL (ref 42–165)
SATURATION RATIOS: 24.9 % (ref 20.0–50.0)
Transferrin: 172 mg/dL — ABNORMAL LOW (ref 212.0–360.0)

## 2014-11-01 LAB — VITAMIN B12: Vitamin B-12: 457 pg/mL (ref 211–911)

## 2014-11-01 NOTE — Telephone Encounter (Signed)
Stool cards have been mailed to pt.

## 2014-11-08 ENCOUNTER — Encounter: Payer: Self-pay | Admitting: Internal Medicine

## 2014-11-12 ENCOUNTER — Encounter: Payer: Self-pay | Admitting: Internal Medicine

## 2014-11-12 ENCOUNTER — Ambulatory Visit (INDEPENDENT_AMBULATORY_CARE_PROVIDER_SITE_OTHER): Payer: Medicare Other | Admitting: Internal Medicine

## 2014-11-12 VITALS — BP 118/52 | HR 82 | Temp 97.5°F | Ht 74.0 in | Wt 192.0 lb

## 2014-11-12 DIAGNOSIS — Z23 Encounter for immunization: Secondary | ICD-10-CM

## 2014-11-12 DIAGNOSIS — R110 Nausea: Secondary | ICD-10-CM | POA: Diagnosis not present

## 2014-11-12 DIAGNOSIS — F329 Major depressive disorder, single episode, unspecified: Secondary | ICD-10-CM

## 2014-11-12 DIAGNOSIS — R140 Abdominal distension (gaseous): Secondary | ICD-10-CM | POA: Diagnosis not present

## 2014-11-12 DIAGNOSIS — R634 Abnormal weight loss: Secondary | ICD-10-CM

## 2014-11-12 DIAGNOSIS — F32A Depression, unspecified: Secondary | ICD-10-CM

## 2014-11-12 MED ORDER — FLUOXETINE HCL 10 MG PO CAPS: 10.0000 mg | ORAL_CAPSULE | Freq: Every day | ORAL | Status: DC

## 2014-11-12 NOTE — Progress Notes (Signed)
Subjective:    Patient ID: Luis Glenn, male    DOB: 05-Jun-1927, 81 y.o.   MRN: [id number]  HPI He gets nausea without vomiting, loss of appetite, abdominal bloating secondary to gas and weight loss.  When he wakes up in the morning he has no symptoms. He eats his typical breakfast.  He hardly eats lunch - does not feel like eating.  The nausea starts sometime in the morning.  For dinner his usually only eats soup - that is the only thing that appeals to him.  His symptoms started 3-4 weeks ago.  He tried the probiotic recommended by Dr. Linna Darner and it caused constipation.  He stopped the medication.  At a baseline he has constipation altering with diarrhea and he thinks he has IBS.  He still has had some constipation and has needed to take some stool softeners and laxatives.  He drinks two alcoholic drinks a night (wine and beer) - that improves his nausea.  He eliminated his coffee because he thought it was filling him up and he wanted to try to eat food.  He drinks soda for the sugar.    He has lost about 10 lbs in the past month.  He has been sleeping well up until the past three nights, which he hardly slept secondary to the constipation/bowel irregularly.  He has been sleeping during the day.    He states fatigue.   He feels depressed and this has gotten worse in the past month.  The depression is related to his wife's death 15 months ago.  He went to a support group after her death, but it did not help.  He has been on medication in the past but they have not helped.  He has considered hurting himself, but has  Never formulated a plan.  He would not hurt himself because he knows it would hurt his children,   Medications and allergies reviewed with patient and updated if appropriate.    Review of Systems  Constitutional: Positive for appetite change, fatigue and unexpected weight change. Negative for fever and chills.  HENT: Positive for congestion. Negative for sore throat.     Cardiovascular: Positive for leg swelling (chronic, unchanged). Negative for chest pain and palpitations.  Gastrointestinal: Positive for nausea, constipation (chronic at baseline) and abdominal distention (blating related to gas). Negative for vomiting and abdominal pain.       No GERD  Musculoskeletal: Positive for arthralgias (joint pain, back pain).  Neurological: Positive for headaches (related to cervical spine arthritis). Negative for dizziness and light-headedness.  Psychiatric/Behavioral: Positive for dysphoric mood. The patient is nervous/anxious (on occasion).        Objective:   Physical Exam  Constitutional: He appears well-developed and well-nourished. No distress.  Neck: No thyromegaly present.  Cardiovascular: Normal rate, regular rhythm and normal heart sounds.   No murmur heard. Pulmonary/Chest: Effort normal and breath sounds normal. No respiratory distress. He has no wheezes.  Abdominal: Soft. Bowel sounds are normal. He exhibits no distension and no mass. There is no tenderness.  Ventral hernia - nontender  Musculoskeletal: He exhibits edema (3+ chronic with dry skin in LE b/l).  Lymphadenopathy:    He has no cervical adenopathy.  Skin: Skin is warm and dry.  Psychiatric: He has a normal mood and affect. His behavior is normal. Thought content normal.      Filed Vitals:   11/12/14 1510  BP: 118/52  Pulse: 82  Temp: 97.5 F (36.4 C)  Filed Weights   11/12/14 1510  Weight: 192 lb (87.091 kg)      Assessment & Plan:    Flu shot given today See problem list for full assessment and plan

## 2014-11-12 NOTE — Assessment & Plan Note (Signed)
Recent blood work reviewed - no cause of weight loss He has lost 10 lbs in the past month - related to loss of appetite and decreased PO intake, which is likely from depression Anti-depressant started Less likely his weight loss is related to GERD or other GI disorder, but he does want to see GI and I think it is reasonable to refer him today

## 2014-11-12 NOTE — Patient Instructions (Addendum)
  We have reviewed your prior records including labs and tests today.  A flu shot was administered today.   Medications reviewed and updated.  We will start prozac (fluoxetine) 10 mg daily.  Your prescription(s) have been submitted to your pharmacy. Please take as directed and contact our office if you believe you are having problem(s) with the medication(s).  You will follow up with me in about 4 weeks to follow up on how the medication is working.  Come in sooner if needed.   A referral has been ordered for GI to help [place] evaluate your nausea, weight loss further.

## 2014-11-12 NOTE — Assessment & Plan Note (Signed)
Recent blood work reviewed Probiotics did not help and he stopped them Possibly related to depression, possibly atypical GERD - he does not feel this is GERD related Will refer to GI for further evaluation

## 2014-11-12 NOTE — Progress Notes (Signed)
Pre visit review using our clinic review tool, if applicable. No additional management support is needed unless otherwise documented below in the visit note. 

## 2014-11-12 NOTE — Assessment & Plan Note (Addendum)
Depression intermittent over the past few years, especially since the death of his wife 15 months ago.  He admits to depression and has even thought of hurting himself in the past month.   Not currently suicidal wellbutrin and sertraline has not been effective in the past.  Grief support groups did not help He would benefit from trying another medication and he agrees Start prozac 10 mg daily Follow up in 3-4 weeks, sooner if needed.  Most likely will need a higher dose

## 2014-11-13 ENCOUNTER — Encounter: Payer: Self-pay | Admitting: Gastroenterology

## 2014-11-28 ENCOUNTER — Ambulatory Visit: Payer: Medicare Other | Admitting: Podiatry

## 2014-12-06 ENCOUNTER — Encounter: Payer: Medicare Other | Admitting: Internal Medicine

## 2014-12-10 ENCOUNTER — Ambulatory Visit: Payer: Medicare Other | Admitting: Internal Medicine

## 2014-12-13 ENCOUNTER — Other Ambulatory Visit: Payer: Self-pay | Admitting: Internal Medicine

## 2014-12-14 ENCOUNTER — Other Ambulatory Visit: Payer: Self-pay | Admitting: Internal Medicine

## 2014-12-14 ENCOUNTER — Ambulatory Visit (INDEPENDENT_AMBULATORY_CARE_PROVIDER_SITE_OTHER): Payer: Medicare Other | Admitting: Internal Medicine

## 2014-12-14 ENCOUNTER — Encounter: Payer: Self-pay | Admitting: Internal Medicine

## 2014-12-14 VITALS — BP 134/60 | HR 70 | Temp 97.9°F | Resp 16 | Wt 206.0 lb

## 2014-12-14 DIAGNOSIS — F329 Major depressive disorder, single episode, unspecified: Secondary | ICD-10-CM | POA: Diagnosis not present

## 2014-12-14 DIAGNOSIS — R634 Abnormal weight loss: Secondary | ICD-10-CM

## 2014-12-14 DIAGNOSIS — F32A Depression, unspecified: Secondary | ICD-10-CM

## 2014-12-14 DIAGNOSIS — R110 Nausea: Secondary | ICD-10-CM

## 2014-12-14 MED ORDER — FLUOXETINE HCL 10 MG PO CAPS: 10.0000 mg | ORAL_CAPSULE | Freq: Every day | ORAL | Status: DC

## 2014-12-14 MED ORDER — OXYCODONE HCL 5 MG PO TABS
5.0000 mg | ORAL_TABLET | Freq: Two times a day (BID) | ORAL | Status: DC | PRN
Start: 2014-12-14 — End: 2015-01-22

## 2014-12-14 NOTE — Assessment & Plan Note (Signed)
Started on prozac 10 mg daily 4 weeks ago Depression improved, but still sad - related to wife's death and within normal limits Feels much better GI symptoms were secondary to depression and have resolved with medication

## 2014-12-14 NOTE — Patient Instructions (Addendum)
   Medications reviewed and updated.  No changes recommended at this time.    Please schedule followup in 3 months

## 2014-12-14 NOTE — Progress Notes (Signed)
Pre visit review using our clinic review tool, if applicable. No additional management support is needed unless otherwise documented below in the visit note. 

## 2014-12-14 NOTE — Progress Notes (Signed)
Subjective:    Patient ID: Luis Glenn, male    DOB: 08-26-1927, 81 y.o.   MRN: [id number]  HPI Depression:  He is taking all of his medication daily as prescribed. He was experiencing nausea, decreased appetite and weight loss, but since starting the prozac. He denies any side effects from the prozac.  He denies abdominal pain.  His bowel movements are more regular.  He still feels a little sad/depressed at times, but feels this is normal.    His sleep is still not great.  He naps during the day and does not have a consistent bedtime.  He has tried sleep meds in the past and they have not helped.   Arthritis:  He takes oxycodone 1-2 pills at bedtime.  He gets arthritic pain throughout his body.  He takes tylenol arthritis twice daily as well.  He has been on the oxycodone for years and does not take more than prescribed.  he denies side effects from the medication.     Medications and allergies reviewed with patient and updated if appropriate.  Patient Active Problem List   Diagnosis Date Noted  . Nausea without vomiting 11/12/2014  . Anemia 10/18/2014  . Renal insufficiency 10/17/2014  . Loss of weight 01/24/2014  . Pericardial effusion 10/18/2013  . Primary localized osteoarthrosis, lower leg 09/29/2013  . OSA (obstructive sleep apnea) 02/24/2013  . CHF (congestive heart failure) ([place])   . Atrial fibrillation ([place])   . IBS (irritable bowel syndrome) 06/24/2011  . Depression 03/19/2009  . DIVERTICULOSIS, COLON 03/19/2009  . OSTEOARTHRITIS 03/19/2009  . HYPERLIPIDEMIA 02/11/2009  . ALLERGIC RHINITIS 02/11/2009  . GERD 02/11/2009  . BENIGN PROSTATIC HYPERTROPHY 02/11/2009  . SHOULDER PAIN, LEFT, CHRONIC 02/11/2009  . GOUT 02/07/2009  . Essential hypertension 02/07/2009  . ANXIETY 06/13/2004  . PERIPHERAL VASCULAR DISEASE 06/12/2003    Past Medical History  Diagnosis Date  . GOUT   . PERIPHERAL VASCULAR DISEASE   . DIVERTICULOSIS, COLON   . BENIGN PROSTATIC  HYPERTROPHY   . DEPRESSION   . DIABETES MELLITUS, TYPE II   . GERD   . HYPERLIPIDEMIA   . HYPERTENSION   . CATARACTS   . OSTEOARTHRITIS   . ANXIETY   . ALLERGIC RHINITIS     Past Surgical History  Procedure Laterality Date  . Tonsillectomy  10/1942    Social History   Social History  . Marital Status: Widowed    Spouse Name: N/A  . Number of Children: N/A  . Years of Education: N/A   Occupational History  . Retired    Social History Main Topics  . Smoking status: Former Smoker -- 3.00 packs/day for 40 years    Types: Cigarettes    Quit date: 02/09/1989  . Smokeless tobacco: None  . Alcohol Use: Yes     Comment: occassional  . Drug Use: No  . Sexual Activity: Not Asked   Other Topics Concern  . None   Social History Narrative   Widowed - wife [name] passed 07/2013    Review of Systems  Constitutional: Negative for fever and chills.       Appetite improved  Respiratory: Positive for shortness of breath. Negative for cough and wheezing.   Cardiovascular: Positive for leg swelling (chronic without change). Negative for chest pain and palpitations.  Gastrointestinal: Negative for nausea, abdominal pain and abdominal distention.  Neurological: Positive for headaches (from neck arthritis). Negative for dizziness and light-headedness.       Objective:  Filed Vitals:   12/14/14 1335  BP: 134/60  Pulse: 70  Temp: 97.9 F (36.6 C)  Resp: 16   Filed Weights   12/14/14 1335  Weight: 206 lb (93.441 kg)   Body mass index is 26.44 kg/(m^2).   Physical Exam  Constitutional: He appears well-developed and well-nourished.  Neck: Neck supple. No JVD present. No tracheal deviation present. No thyromegaly present.  Cardiovascular: Normal rate, regular rhythm and normal heart sounds.   No murmur heard. Pulmonary/Chest: Effort normal and breath sounds normal. No respiratory distress. He has no wheezes. He has no rales.  Musculoskeletal: He exhibits edema (2 + edema  with dryness, no breaks in skin).  Lymphadenopathy:    He has no cervical adenopathy.        Assessment & Plan:   See Problem List.   Follow up in 3 months

## 2014-12-14 NOTE — Assessment & Plan Note (Addendum)
Nausea and abdominal bloating have resolved with prozac --symptoms were likely related to depession Was referred to GI, but will cancel appt

## 2014-12-14 NOTE — Assessment & Plan Note (Signed)
Has regained weight he lost after starting prozac -- GI symptoms resolved

## 2014-12-15 NOTE — Addendum Note (Signed)
Addended by: Lowella Dandy on: 12/15/2014 08:50 AM   Modules accepted: Orders

## 2014-12-28 ENCOUNTER — Other Ambulatory Visit: Payer: Self-pay

## 2014-12-28 ENCOUNTER — Encounter: Payer: Self-pay | Admitting: Cardiovascular Disease

## 2014-12-28 DIAGNOSIS — I4819 Other persistent atrial fibrillation: Secondary | ICD-10-CM

## 2014-12-28 MED ORDER — APIXABAN 2.5 MG PO TABS: 2.5000 mg | ORAL_TABLET | Freq: Two times a day (BID) | ORAL | Status: AC

## 2015-01-02 ENCOUNTER — Encounter: Payer: Self-pay | Admitting: Cardiovascular Disease

## 2015-01-02 ENCOUNTER — Encounter: Payer: Self-pay | Admitting: Internal Medicine

## 2015-01-04 ENCOUNTER — Encounter: Payer: Self-pay | Admitting: Internal Medicine

## 2015-01-04 ENCOUNTER — Other Ambulatory Visit: Payer: Self-pay | Admitting: Emergency Medicine

## 2015-01-04 NOTE — Telephone Encounter (Signed)
Please advise 

## 2015-01-06 MED ORDER — ALPRAZOLAM 0.5 MG PO TABS: 0.5000 mg | ORAL_TABLET | Freq: Three times a day (TID) | ORAL | Status: DC | PRN

## 2015-01-07 ENCOUNTER — Encounter: Payer: Self-pay | Admitting: Internal Medicine

## 2015-01-07 DIAGNOSIS — R630 Anorexia: Secondary | ICD-10-CM

## 2015-01-07 DIAGNOSIS — R110 Nausea: Secondary | ICD-10-CM

## 2015-01-08 ENCOUNTER — Ambulatory Visit: Payer: Medicare Other | Admitting: Gastroenterology

## 2015-01-09 ENCOUNTER — Encounter: Payer: Self-pay | Admitting: Internal Medicine

## 2015-01-09 ENCOUNTER — Telehealth: Payer: Self-pay | Admitting: Internal Medicine

## 2015-01-09 MED ORDER — ALPRAZOLAM 0.5 MG PO TABS: 0.5000 mg | ORAL_TABLET | Freq: Three times a day (TID) | ORAL | Status: DC | PRN

## 2015-01-09 NOTE — Telephone Encounter (Signed)
Luis Glenn on [place] Has not received the prescription for ALPRAZolam Duanne Moron) 0.5 MG tablet [id number] Can you please resend that ASAP. He doesn't have any and he is feeling it

## 2015-01-09 NOTE — Telephone Encounter (Signed)
Trying to call pharmacy - lines are busy.   Was able to get through and left a message.   Called pt and pt stated that is was already taken care of.

## 2015-01-09 NOTE — Addendum Note (Signed)
Addended by: Della Goo C on: 01/09/2015 03:55 PM   Modules accepted: Orders

## 2015-01-11 ENCOUNTER — Ambulatory Visit: Payer: Self-pay | Admitting: Internal Medicine

## 2015-01-22 ENCOUNTER — Other Ambulatory Visit: Payer: Self-pay | Admitting: Internal Medicine

## 2015-01-22 ENCOUNTER — Telehealth: Payer: Self-pay | Admitting: Internal Medicine

## 2015-01-22 MED ORDER — OXYCODONE HCL 5 MG PO TABS: 5.0000 mg | ORAL_TABLET | Freq: Two times a day (BID) | ORAL | Status: DC | PRN

## 2015-01-22 NOTE — Telephone Encounter (Signed)
rx printed

## 2015-01-22 NOTE — Addendum Note (Signed)
Addended by: Binnie Rail on: 01/22/2015 12:06 PM   Modules accepted: Orders

## 2015-01-22 NOTE — Telephone Encounter (Signed)
Pt has been informed RX is ready for pick-up

## 2015-01-29 ENCOUNTER — Other Ambulatory Visit: Payer: Self-pay | Admitting: Internal Medicine

## 2015-01-31 ENCOUNTER — Telehealth: Payer: Self-pay | Admitting: Internal Medicine

## 2015-01-31 MED ORDER — SIMVASTATIN 40 MG PO TABS: 40.0000 mg | ORAL_TABLET | Freq: Every day | ORAL | Status: AC

## 2015-01-31 NOTE — Telephone Encounter (Signed)
Pt request refill for simvastatin (ZOCOR) 40 MG tablet to be send to [facility].

## 2015-02-12 ENCOUNTER — Encounter: Payer: Self-pay | Admitting: Cardiovascular Disease

## 2015-02-25 NOTE — Telephone Encounter (Signed)
Pt calling to see if pre-auth was obtained for Eliquis -pls call [phone]

## 2015-02-27 ENCOUNTER — Telehealth: Payer: Self-pay | Admitting: Cardiovascular Disease

## 2015-02-27 NOTE — Telephone Encounter (Signed)
Left message for patient to call back regarding paperwork for lower tier copay

## 2015-02-27 NOTE — Telephone Encounter (Signed)
New Message   Pt c/o medication issue: 1. Name of Medication: Eliquis  4. What is your medication issue? Pt called state that the eliquis is too expensive. Req a call back to discuss an alternitive because the price ranges from from $160- $600 dolloars. Pt request a call back to discuss.

## 2015-02-28 NOTE — Telephone Encounter (Signed)
Spoke with patient and advised him that I will send paperwork to request tier exception for lower copay for Eliquis since he has done well on the medication.  He states he has enough medicine for 1 month.  I advised him I will be in touch regarding insurance company's response.  He verbalized understanding and agreement.

## 2015-03-04 ENCOUNTER — Ambulatory Visit (INDEPENDENT_AMBULATORY_CARE_PROVIDER_SITE_OTHER): Payer: Medicare Other | Admitting: Family Medicine

## 2015-03-04 ENCOUNTER — Encounter: Payer: Self-pay | Admitting: Internal Medicine

## 2015-03-04 ENCOUNTER — Encounter: Payer: Self-pay | Admitting: Family Medicine

## 2015-03-04 VITALS — BP 130/52 | HR 80 | Temp 97.7°F

## 2015-03-04 DIAGNOSIS — L03116 Cellulitis of left lower limb: Secondary | ICD-10-CM | POA: Diagnosis not present

## 2015-03-04 MED ORDER — CLINDAMYCIN HCL 300 MG PO CAPS: 300.0000 mg | ORAL_CAPSULE | Freq: Three times a day (TID) | ORAL | Status: DC

## 2015-03-04 NOTE — Patient Instructions (Signed)
Take antibiotic 3x a day. I gave you 10 days worth but may be ok with 7 days.   I have requested home health start seeing you tomorrow if possible. If not- I would ask that you have someone help you take the dressing off and wash the leg with water and some mild soap but do not scrub open area then dry and replace the nonadherent dressing first then a 4x4 pad and wrap with ace wrap again.   [name] has some appointments next Monday and Tuesday- please schedule one of these slots for follow up.

## 2015-03-04 NOTE — Progress Notes (Signed)
Garret Reddish, MD  Subjective:  ANGELIA Glenn is a 82 y.o. year old very pleasant male patient who presents for/with See problem oriented charting ROS- no fever, chills, nausea, vomiting. Does not increasing redness and tenderness on left leg as well as continued clear drainage  Past Medical History-  Gout, hypertension, a fib on xarelto, depression, CHF, hyperlipidemia, BPH, anxiety and depression   Medications- reviewed and updated Current Outpatient Prescriptions  Medication Sig Dispense Refill  . allopurinol (ZYLOPRIM) 300 MG tablet Take 1 tablet (300 mg total) by mouth daily. 30 tablet 6  . amLODipine-benazepril (LOTREL) 10-20 MG per capsule TAKE 1 CAPSULE BY MOUTH DAILY 90 capsule 3  . apixaban (ELIQUIS) 2.5 MG TABS tablet Take 1 tablet (2.5 mg total) by mouth 2 (two) times daily. 60 tablet 9  . FLUoxetine (PROZAC) 10 MG capsule Take 1 capsule (10 mg total) by mouth daily. 90 capsule 3  . fluticasone (FLONASE) 50 MCG/ACT nasal spray Place 2 sprays into both nostrils daily. 16 g 6  . furosemide (LASIX) 40 MG tablet Take 1 tablet (40 mg total) by mouth 2 (two) times daily. 180 tablet 3  . Potassium Chloride ER 20 MEQ TBCR TAKE 1 TABLET BY MOUTH DAILY 30 tablet 11  . simvastatin (ZOCOR) 40 MG tablet Take 1 tablet (40 mg total) by mouth daily at 6 PM. 90 tablet 1  . tamsulosin (FLOMAX) 0.4 MG CAPS capsule TAKE ONE CAPSULE BY MOUTH DAILY 90 capsule 3  . terazosin (HYTRIN) 10 MG capsule TAKE 1 CAPSULE (10 MG TOTAL) BY MOUTH DAILY. 90 capsule 1  . acetaminophen (TYLENOL) 500 MG tablet Take 500 mg by mouth at bedtime. Reported on 03/04/2015    . ALPRAZolam (XANAX) 0.5 MG tablet Take 1 tablet (0.5 mg total) by mouth 3 (three) times daily as needed for anxiety. (Patient not taking: Reported on 03/04/2015) 90 tablet 5  . oxyCODONE (OXY IR/ROXICODONE) 5 MG immediate release tablet Take 1 tablet (5 mg total) by mouth 2 (two) times daily as needed for severe pain. (Patient not taking: Reported on  03/04/2015) 60 tablet 0   Objective: BP 130/52 mmHg  Pulse 80  Temp(Src) 97.7 F (36.5 C)  Wt  Gen: NAD, resting comfortably CV: RRR no murmurs rubs or gallops Lungs: CTAB no crackles, wheeze, rhonchi Abdomen: soft/nontender/nondistended/normal bowel sounds. No rebound or guarding.  Ext: 3+ edema with extensive venous stasis changes. No skin breakdown on right leg but several small roofed blisters.  On left leg there is a 5x3 cm unroofed blister with red base with intermittent drainage of clear fluid. Primarily beneath and to the sides of this patient is tender on exam and with more intense warmth and erythema as compared to right leg.  Skin: warm, dry otherwise Neuro: grossly normal, moves all extremities  Assessment/Plan:  Left lower extremity Cellulitis S: Patient noted a larger vesicle on his left shin about a week ago which began to drain. Drained so much that he had to wear shoes with some fluff in them as other shoes would get soaked. He then a few days ago started to note some worsening redness around the area and then became warm. Daughter who is an Therapist, sports very strongly advised that he be seen by his physician.  A/P: Patient has an open ulceration after a blister was unroofed with unclear mechanism. Appears to be source of infection as has cellulitis beneath this wound. We will treat with 7 days of clindamycin (states severe rash on penicillin involving his  face though years ago so avoided keflex as would usually use in this case). I requested home health to come to home tomorrow to help him cleanse the area and redress and hopefully they can see him daily until heals. If not instructions per AVS on how to clean and wrap (if has to wait a day to see them). Family would have to help as he cannot reach his legs. He has extreme difficulty getting out of the house and is in a wheelchair today. I asked him to see [name] on Monday as he has a few appointments left but he did not schedule this  on his departure as instructed. Sooner return precautions given- expanding redness, fever, worsening pain verbally  Orders Placed This Encounter  Procedures  . Ambulatory referral to Home Health    Referral Priority:  Routine    Referral Type:  Home Health Care    Referral Reason:  Specialty Services Required    Requested Specialty:  [facility]    Number of Visits Requested:  1    Meds ordered this encounter  Medications  . clindamycin (CLEOCIN) 300 MG capsule    Sig: Take 1 capsule (300 mg total) by mouth 3 (three) times daily.    Dispense:  30 capsule    Refill:  0   >50% of 25 minute office visit was spent on counseling (signs and symptoms to watch out for, instructions on how to clean wound and redress it, discussing this with his assistant who he pays to help care for him but does not have medical background) and coordination of care

## 2015-03-05 ENCOUNTER — Telehealth: Payer: Self-pay | Admitting: Internal Medicine

## 2015-03-05 NOTE — Telephone Encounter (Signed)
Called and lm on pt vm letting him know that i spoke with the home health lady this morning and they are processing his referral and someone should be contacting him soon.

## 2015-03-05 NOTE — Telephone Encounter (Signed)
Pt call to follow up on home health coming out to help him change his dressing

## 2015-03-06 DIAGNOSIS — I739 Peripheral vascular disease, unspecified: Secondary | ICD-10-CM | POA: Diagnosis not present

## 2015-03-06 DIAGNOSIS — K219 Gastro-esophageal reflux disease without esophagitis: Secondary | ICD-10-CM | POA: Diagnosis not present

## 2015-03-06 DIAGNOSIS — K589 Irritable bowel syndrome without diarrhea: Secondary | ICD-10-CM | POA: Diagnosis not present

## 2015-03-06 DIAGNOSIS — I872 Venous insufficiency (chronic) (peripheral): Secondary | ICD-10-CM | POA: Diagnosis not present

## 2015-03-06 DIAGNOSIS — F329 Major depressive disorder, single episode, unspecified: Secondary | ICD-10-CM | POA: Diagnosis not present

## 2015-03-06 DIAGNOSIS — I10 Essential (primary) hypertension: Secondary | ICD-10-CM | POA: Diagnosis not present

## 2015-03-06 DIAGNOSIS — I4891 Unspecified atrial fibrillation: Secondary | ICD-10-CM | POA: Diagnosis not present

## 2015-03-06 DIAGNOSIS — M150 Primary generalized (osteo)arthritis: Secondary | ICD-10-CM | POA: Diagnosis not present

## 2015-03-06 DIAGNOSIS — I509 Heart failure, unspecified: Secondary | ICD-10-CM | POA: Diagnosis not present

## 2015-03-06 DIAGNOSIS — F419 Anxiety disorder, unspecified: Secondary | ICD-10-CM | POA: Diagnosis not present

## 2015-03-06 DIAGNOSIS — L97211 Non-pressure chronic ulcer of right calf limited to breakdown of skin: Secondary | ICD-10-CM | POA: Diagnosis not present

## 2015-03-06 DIAGNOSIS — K5790 Diverticulosis of intestine, part unspecified, without perforation or abscess without bleeding: Secondary | ICD-10-CM | POA: Diagnosis not present

## 2015-03-06 DIAGNOSIS — L03116 Cellulitis of left lower limb: Secondary | ICD-10-CM | POA: Diagnosis not present

## 2015-03-06 DIAGNOSIS — L03115 Cellulitis of right lower limb: Secondary | ICD-10-CM | POA: Diagnosis not present

## 2015-03-06 DIAGNOSIS — E785 Hyperlipidemia, unspecified: Secondary | ICD-10-CM | POA: Diagnosis not present

## 2015-03-06 DIAGNOSIS — Z9181 History of falling: Secondary | ICD-10-CM | POA: Diagnosis not present

## 2015-03-07 ENCOUNTER — Telehealth: Payer: Self-pay | Admitting: Internal Medicine

## 2015-03-07 NOTE — Telephone Encounter (Signed)
LVM for Luis Glenn informing of lab results.

## 2015-03-07 NOTE — Telephone Encounter (Signed)
Please advise 

## 2015-03-07 NOTE — Telephone Encounter (Signed)
Enrolled patient in [facility] services yesterday.  States patient had a fall prior to their visit.  EMS came out.  There was not injury.  Would like to add PT and OT.  Patient has an upcoming appointment.  States patient will talk about inpatient therapy.  Also, drug interaction between zocor and lotrel.  States they are visiting patient for wound care.

## 2015-03-07 NOTE — Telephone Encounter (Signed)
[facility] for PT, OT.  Can discuss meds at his visit

## 2015-03-08 DIAGNOSIS — I739 Peripheral vascular disease, unspecified: Secondary | ICD-10-CM | POA: Diagnosis not present

## 2015-03-08 DIAGNOSIS — I509 Heart failure, unspecified: Secondary | ICD-10-CM | POA: Diagnosis not present

## 2015-03-08 DIAGNOSIS — L03115 Cellulitis of right lower limb: Secondary | ICD-10-CM | POA: Diagnosis not present

## 2015-03-08 DIAGNOSIS — I872 Venous insufficiency (chronic) (peripheral): Secondary | ICD-10-CM | POA: Diagnosis not present

## 2015-03-08 DIAGNOSIS — L97211 Non-pressure chronic ulcer of right calf limited to breakdown of skin: Secondary | ICD-10-CM | POA: Diagnosis not present

## 2015-03-08 DIAGNOSIS — L03116 Cellulitis of left lower limb: Secondary | ICD-10-CM | POA: Diagnosis not present

## 2015-03-09 ENCOUNTER — Other Ambulatory Visit: Payer: Self-pay | Admitting: Internal Medicine

## 2015-03-11 ENCOUNTER — Other Ambulatory Visit (INDEPENDENT_AMBULATORY_CARE_PROVIDER_SITE_OTHER): Payer: Medicare Other

## 2015-03-11 ENCOUNTER — Ambulatory Visit: Payer: Medicare Other | Admitting: Cardiovascular Disease

## 2015-03-11 ENCOUNTER — Encounter: Payer: Self-pay | Admitting: Internal Medicine

## 2015-03-11 ENCOUNTER — Ambulatory Visit (INDEPENDENT_AMBULATORY_CARE_PROVIDER_SITE_OTHER): Payer: Medicare Other | Admitting: Internal Medicine

## 2015-03-11 VITALS — BP 120/54 | HR 79 | Temp 97.5°F | Resp 16

## 2015-03-11 DIAGNOSIS — I5033 Acute on chronic diastolic (congestive) heart failure: Secondary | ICD-10-CM | POA: Diagnosis not present

## 2015-03-11 MED ORDER — OXYCODONE HCL 5 MG PO TABS: 5.0000 mg | ORAL_TABLET | Freq: Two times a day (BID) | ORAL | Status: DC | PRN

## 2015-03-11 MED ORDER — SPIRONOLACTONE 25 MG PO TABS: 25.0000 mg | ORAL_TABLET | Freq: Every day | ORAL | Status: DC

## 2015-03-11 NOTE — Patient Instructions (Signed)
  We have reviewed your prior records including labs and tests today.  Test(s) ordered today. Your results will be released to [facility] (or called to you) after review, usually within 72hours after test completion. If any changes need to be made, you will be notified at that same time.

## 2015-03-11 NOTE — Progress Notes (Signed)
Subjective:    Patient ID: Luis Glenn, male    DOB: 1927/02/10, 82 y.o.   MRN: [id number]  HPI He is here for an acute visit. He has increased swelling in his legs and believes that occurred over the past couple of weeks, but is not sure. He is always had significant swelling on exam, but it has gotten worse.  Both legs are swollen in his left lower leg is losing fluid. He was recently treated for cellulitis. He has completed the antibiotic.  His right lower leg has redness and scaly appearance and one of the home nurses was concerned about possible fungal infection. When the nurses has noticed that his left arm and wrist is also swollen and that was previously losing clear fluid as well.  He denies any fevers or chills. He has been experiencing increased shortness of breath from his baseline.  He has wheezing when he lays on his right side only. He does have chronic cough. He states chest pain related to indigestion. He has decreased appetite and generalized weakness.  He denies any change in his diet. He states he does not watch the amount of sodium that he consumes. There has been no changes in his medication.  He is trying to decide whether he should go for inpatient physical therapy at a rehabilitation Center or have home physical therapy.  He is taking all his medication daily as prescribed. He does not feel that the water pill is as effective. He is not urinating as much as he usually does. He sometimes has a week stream or difficulty urinating.   Medications and allergies reviewed with patient and updated if appropriate.  Patient Active Problem List   Diagnosis Date Noted  . Nausea without vomiting 11/12/2014  . Anemia 10/18/2014  . Renal insufficiency 10/17/2014  . Loss of weight 01/24/2014  . Pericardial effusion 10/18/2013  . Primary localized osteoarthrosis, lower leg 09/29/2013  . OSA (obstructive sleep apnea) 02/24/2013  . CHF (congestive heart failure) ([place])   .  Atrial fibrillation ([place])   . IBS (irritable bowel syndrome) 06/24/2011  . Depression 03/19/2009  . DIVERTICULOSIS, COLON 03/19/2009  . OSTEOARTHRITIS 03/19/2009  . HYPERLIPIDEMIA 02/11/2009  . ALLERGIC RHINITIS 02/11/2009  . GERD 02/11/2009  . BENIGN PROSTATIC HYPERTROPHY 02/11/2009  . SHOULDER PAIN, LEFT, CHRONIC 02/11/2009  . GOUT 02/07/2009  . Essential hypertension 02/07/2009  . ANXIETY 06/13/2004  . PERIPHERAL VASCULAR DISEASE 06/12/2003    Current Outpatient Prescriptions on File Prior to Visit  Medication Sig Dispense Refill  . acetaminophen (TYLENOL) 500 MG tablet Take 500 mg by mouth at bedtime. Reported on 03/04/2015    . allopurinol (ZYLOPRIM) 300 MG tablet Take 1 tablet (300 mg total) by mouth daily. 30 tablet 6  . ALPRAZolam (XANAX) 0.5 MG tablet Take 1 tablet (0.5 mg total) by mouth 3 (three) times daily as needed for anxiety. 90 tablet 5  . amLODipine-benazepril (LOTREL) 10-20 MG per capsule TAKE 1 CAPSULE BY MOUTH DAILY 90 capsule 3  . apixaban (ELIQUIS) 2.5 MG TABS tablet Take 1 tablet (2.5 mg total) by mouth 2 (two) times daily. 60 tablet 9  . FLUoxetine (PROZAC) 10 MG capsule Take 1 capsule (10 mg total) by mouth daily. 90 capsule 3  . fluticasone (FLONASE) 50 MCG/ACT nasal spray Place 2 sprays into both nostrils daily. 16 g 6  . furosemide (LASIX) 40 MG tablet Take 1 tablet (40 mg total) by mouth 2 (two) times daily. 180 tablet 3  .  oxyCODONE (OXY IR/ROXICODONE) 5 MG immediate release tablet Take 1 tablet (5 mg total) by mouth 2 (two) times daily as needed for severe pain. 60 tablet 0  . Potassium Chloride ER 20 MEQ TBCR TAKE 1 TABLET BY MOUTH DAILY 30 tablet 11  . simvastatin (ZOCOR) 40 MG tablet Take 1 tablet (40 mg total) by mouth daily at 6 PM. 90 tablet 1  . tamsulosin (FLOMAX) 0.4 MG CAPS capsule TAKE ONE CAPSULE BY MOUTH DAILY 90 capsule 3  . terazosin (HYTRIN) 10 MG capsule TAKE 1 CAPSULE (10 MG TOTAL) BY MOUTH DAILY. 90 capsule 1   No current  facility-administered medications on file prior to visit.    Past Medical History  Diagnosis Date  . GOUT   . PERIPHERAL VASCULAR DISEASE   . DIVERTICULOSIS, COLON   . BENIGN PROSTATIC HYPERTROPHY   . DEPRESSION   . DIABETES MELLITUS, TYPE II   . GERD   . HYPERLIPIDEMIA   . HYPERTENSION   . CATARACTS   . OSTEOARTHRITIS   . ANXIETY   . ALLERGIC RHINITIS     Past Surgical History  Procedure Laterality Date  . Tonsillectomy  10/1942    Social History   Social History  . Marital Status: Widowed    Spouse Name: N/A  . Number of Children: N/A  . Years of Education: N/A   Occupational History  . Retired    Social History Main Topics  . Smoking status: Former Smoker -- 3.00 packs/day for 40 years    Types: Cigarettes    Quit date: 02/09/1989  . Smokeless tobacco: None  . Alcohol Use: Yes     Comment: occassional  . Drug Use: No  . Sexual Activity: Not Asked   Other Topics Concern  . None   Social History Narrative   Widowed - wife [name] passed 07/2013    Family History  Problem Relation Age of Onset  . Diabetes Mother   . Hypertension Mother   . Diabetes Father   . Hypertension Father   . Alcohol abuse Other   . Breast cancer Other     Review of Systems  Constitutional: Positive for appetite change (loss of appetite). Negative for fever and chills.       Generalized weakness  Respiratory: Positive for cough, shortness of breath (increased from baseline) and wheezing (when laying on right side only).   Cardiovascular: Positive for chest pain (related to GERD) and leg swelling. Negative for palpitations.  Gastrointestinal:       GERD  Neurological: Negative for light-headedness and headaches.       Objective:   Filed Vitals:   03/11/15 1609  BP: 120/54  Pulse: 79  Temp: 97.5 F (36.4 C)  Resp: 16   Filed Weights   There is no weight on file to calculate BMI.   Physical Exam  Constitutional: He is oriented to person, place, and time.    Elderly man in a wheelchair in no acute distress  HENT:  Head: Normocephalic and atraumatic.  Eyes: Conjunctivae are normal.  Neck: Neck supple. No tracheal deviation present. No thyromegaly present.  Cardiovascular: Normal rate and regular rhythm.   Pulmonary/Chest: Effort normal. No respiratory distress. He has no wheezes. He has rales (Bibasilar crackles).  He does appear short of breath with exertion  Musculoskeletal: He exhibits edema (all of his extremities are edematous, even his abdomen=nasarca).  Lymphadenopathy:    He has no cervical adenopathy.  Neurological: He is alert and oriented to person, place,  and time.  Skin: Skin is warm.  Area of left anterior shin that is oozing, bandage is dry, no surrounding erythema  Psychiatric: He has a normal mood and affect. His behavior is normal.        Assessment & Plan:   Acute on chronic CHF, fluid overloaded He is extremely overloaded with fluid in the setting of chronic kidney disease and a large pericardial effusion Check BNP and CMP Continue Lasix 40 mg twice daily, but I do not think this is doing much. I will go ahead and add spironolactone 25 mg daily, but depending on what his kidney function is like he may not be able to take this. I also do not think it'll be very effective. May need IV diuresis or may need to be changed to torsemide Will await his blood work and discuss with his cardiologist given his large pericardial effusion that has been chronic-last echocardiogram May 2016, which showed large pericardial effusion  Depending on blood work and what cardiology thinks and will instruct him further tomorrow-may need hospitalization. It is symptoms worsen encouraged him to go to the emergency room.

## 2015-03-11 NOTE — Progress Notes (Signed)
Pre visit review using our clinic review tool, if applicable. No additional management support is needed unless otherwise documented below in the visit note. 

## 2015-03-11 NOTE — Telephone Encounter (Signed)
refilled 

## 2015-03-12 ENCOUNTER — Emergency Department (HOSPITAL_COMMUNITY): Payer: Medicare Other

## 2015-03-12 ENCOUNTER — Inpatient Hospital Stay (HOSPITAL_COMMUNITY)
Admission: EM | Admit: 2015-03-12 | Discharge: 2015-03-22 | DRG: 314 | Disposition: A | Discharge status: Skilled Nursing Facility | Payer: Medicare Other | Attending: Internal Medicine | Admitting: Internal Medicine

## 2015-03-12 ENCOUNTER — Encounter (HOSPITAL_COMMUNITY): Payer: Self-pay | Admitting: Emergency Medicine

## 2015-03-12 ENCOUNTER — Encounter: Payer: Self-pay | Admitting: Internal Medicine

## 2015-03-12 DIAGNOSIS — E1151 Type 2 diabetes mellitus with diabetic peripheral angiopathy without gangrene: Secondary | ICD-10-CM | POA: Diagnosis not present

## 2015-03-12 DIAGNOSIS — I272 Other secondary pulmonary hypertension: Secondary | ICD-10-CM | POA: Diagnosis present

## 2015-03-12 DIAGNOSIS — L03116 Cellulitis of left lower limb: Secondary | ICD-10-CM | POA: Diagnosis present

## 2015-03-12 DIAGNOSIS — R2681 Unsteadiness on feet: Secondary | ICD-10-CM | POA: Diagnosis present

## 2015-03-12 DIAGNOSIS — Z8249 Family history of ischemic heart disease and other diseases of the circulatory system: Secondary | ICD-10-CM

## 2015-03-12 DIAGNOSIS — I482 Chronic atrial fibrillation: Secondary | ICD-10-CM | POA: Diagnosis not present

## 2015-03-12 DIAGNOSIS — M109 Gout, unspecified: Secondary | ICD-10-CM | POA: Diagnosis present

## 2015-03-12 DIAGNOSIS — I739 Peripheral vascular disease, unspecified: Secondary | ICD-10-CM | POA: Diagnosis not present

## 2015-03-12 DIAGNOSIS — I509 Heart failure, unspecified: Secondary | ICD-10-CM | POA: Diagnosis not present

## 2015-03-12 DIAGNOSIS — I5033 Acute on chronic diastolic (congestive) heart failure: Secondary | ICD-10-CM | POA: Diagnosis present

## 2015-03-12 DIAGNOSIS — S80822A Blister (nonthermal), left lower leg, initial encounter: Secondary | ICD-10-CM | POA: Diagnosis present

## 2015-03-12 DIAGNOSIS — N183 Chronic kidney disease, stage 3 unspecified: Secondary | ICD-10-CM

## 2015-03-12 DIAGNOSIS — F419 Anxiety disorder, unspecified: Secondary | ICD-10-CM | POA: Diagnosis present

## 2015-03-12 DIAGNOSIS — K219 Gastro-esophageal reflux disease without esophagitis: Secondary | ICD-10-CM | POA: Diagnosis present

## 2015-03-12 DIAGNOSIS — L03115 Cellulitis of right lower limb: Secondary | ICD-10-CM | POA: Diagnosis not present

## 2015-03-12 DIAGNOSIS — N400 Enlarged prostate without lower urinary tract symptoms: Secondary | ICD-10-CM | POA: Diagnosis present

## 2015-03-12 DIAGNOSIS — I878 Other specified disorders of veins: Secondary | ICD-10-CM | POA: Diagnosis present

## 2015-03-12 DIAGNOSIS — R609 Edema, unspecified: Secondary | ICD-10-CM | POA: Insufficient documentation

## 2015-03-12 DIAGNOSIS — E785 Hyperlipidemia, unspecified: Secondary | ICD-10-CM | POA: Diagnosis present

## 2015-03-12 DIAGNOSIS — Z7951 Long term (current) use of inhaled steroids: Secondary | ICD-10-CM

## 2015-03-12 DIAGNOSIS — I313 Pericardial effusion (noninflammatory): Secondary | ICD-10-CM | POA: Diagnosis not present

## 2015-03-12 DIAGNOSIS — E1122 Type 2 diabetes mellitus with diabetic chronic kidney disease: Secondary | ICD-10-CM | POA: Diagnosis not present

## 2015-03-12 DIAGNOSIS — R404 Transient alteration of awareness: Secondary | ICD-10-CM | POA: Diagnosis not present

## 2015-03-12 DIAGNOSIS — R531 Weakness: Secondary | ICD-10-CM | POA: Diagnosis not present

## 2015-03-12 DIAGNOSIS — D649 Anemia, unspecified: Secondary | ICD-10-CM | POA: Diagnosis not present

## 2015-03-12 DIAGNOSIS — I480 Paroxysmal atrial fibrillation: Secondary | ICD-10-CM | POA: Diagnosis present

## 2015-03-12 DIAGNOSIS — R600 Localized edema: Secondary | ICD-10-CM | POA: Diagnosis not present

## 2015-03-12 DIAGNOSIS — R0602 Shortness of breath: Secondary | ICD-10-CM | POA: Diagnosis not present

## 2015-03-12 DIAGNOSIS — I10 Essential (primary) hypertension: Secondary | ICD-10-CM | POA: Diagnosis present

## 2015-03-12 DIAGNOSIS — L97211 Non-pressure chronic ulcer of right calf limited to breakdown of skin: Secondary | ICD-10-CM | POA: Diagnosis not present

## 2015-03-12 DIAGNOSIS — E875 Hyperkalemia: Secondary | ICD-10-CM | POA: Diagnosis present

## 2015-03-12 DIAGNOSIS — I130 Hypertensive heart and chronic kidney disease with heart failure and stage 1 through stage 4 chronic kidney disease, or unspecified chronic kidney disease: Secondary | ICD-10-CM | POA: Diagnosis present

## 2015-03-12 DIAGNOSIS — M1990 Unspecified osteoarthritis, unspecified site: Secondary | ICD-10-CM | POA: Diagnosis present

## 2015-03-12 DIAGNOSIS — I351 Nonrheumatic aortic (valve) insufficiency: Secondary | ICD-10-CM | POA: Diagnosis present

## 2015-03-12 DIAGNOSIS — Z880 Allergy status to penicillin: Secondary | ICD-10-CM

## 2015-03-12 DIAGNOSIS — R008 Other abnormalities of heart beat: Secondary | ICD-10-CM | POA: Diagnosis present

## 2015-03-12 DIAGNOSIS — L0390 Cellulitis, unspecified: Secondary | ICD-10-CM | POA: Insufficient documentation

## 2015-03-12 DIAGNOSIS — Z87891 Personal history of nicotine dependence: Secondary | ICD-10-CM

## 2015-03-12 DIAGNOSIS — I872 Venous insufficiency (chronic) (peripheral): Secondary | ICD-10-CM | POA: Diagnosis not present

## 2015-03-12 DIAGNOSIS — Z833 Family history of diabetes mellitus: Secondary | ICD-10-CM

## 2015-03-12 DIAGNOSIS — J309 Allergic rhinitis, unspecified: Secondary | ICD-10-CM | POA: Diagnosis present

## 2015-03-12 DIAGNOSIS — M7989 Other specified soft tissue disorders: Secondary | ICD-10-CM | POA: Diagnosis not present

## 2015-03-12 DIAGNOSIS — I4891 Unspecified atrial fibrillation: Secondary | ICD-10-CM | POA: Diagnosis present

## 2015-03-12 DIAGNOSIS — Z7901 Long term (current) use of anticoagulants: Secondary | ICD-10-CM

## 2015-03-12 DIAGNOSIS — I3139 Other pericardial effusion (noninflammatory): Secondary | ICD-10-CM | POA: Diagnosis present

## 2015-03-12 HISTORY — DX: Other pericardial effusion (noninflammatory): I31.39

## 2015-03-12 HISTORY — DX: Pericardial effusion (noninflammatory): I31.3

## 2015-03-12 LAB — CBC WITH DIFFERENTIAL/PLATELET
BASOS ABS: 0 10*3/uL (ref 0.0–0.1)
BASOS PCT: 0 %
EOS ABS: 0.1 10*3/uL (ref 0.0–0.7)
EOS PCT: 3 %
HEMATOCRIT: 36.7 % — AB (ref 39.0–52.0)
Hemoglobin: 11.5 g/dL — ABNORMAL LOW (ref 13.0–17.0)
Lymphocytes Relative: 13 %
Lymphs Abs: 0.6 10*3/uL — ABNORMAL LOW (ref 0.7–4.0)
MCH: 29 pg (ref 26.0–34.0)
MCHC: 31.3 g/dL (ref 30.0–36.0)
MCV: 92.7 fL (ref 78.0–100.0)
MONO ABS: 0.3 10*3/uL (ref 0.1–1.0)
MONOS PCT: 6 %
NEUTROS ABS: 3.8 10*3/uL (ref 1.7–7.7)
Neutrophils Relative %: 78 %
PLATELETS: 184 10*3/uL (ref 150–400)
RBC: 3.96 MIL/uL — ABNORMAL LOW (ref 4.22–5.81)
RDW: 15.8 % — AB (ref 11.5–15.5)
WBC: 4.9 10*3/uL (ref 4.0–10.5)

## 2015-03-12 LAB — COMPREHENSIVE METABOLIC PANEL
ALK PHOS: 107 U/L (ref 39–117)
ALT: 9 U/L (ref 0–53)
AST: 16 U/L (ref 0–37)
Albumin: 3.4 g/dL — ABNORMAL LOW (ref 3.5–5.2)
BUN: 42 mg/dL — AB (ref 6–23)
CO2: 23 mEq/L (ref 19–32)
Calcium: 8.8 mg/dL (ref 8.4–10.5)
Chloride: 110 mEq/L (ref 96–112)
Creatinine, Ser: 1.89 mg/dL — ABNORMAL HIGH (ref 0.40–1.50)
GFR: 36.14 mL/min — ABNORMAL LOW (ref >60.00)
GLUCOSE: 109 mg/dL — AB (ref 70–99)
POTASSIUM: 5.3 meq/L — AB (ref 3.5–5.1)
SODIUM: 141 meq/L (ref 135–145)
TOTAL PROTEIN: 6 g/dL (ref 6.0–8.3)
Total Bilirubin: 0.6 mg/dL (ref 0.2–1.2)

## 2015-03-12 LAB — BRAIN NATRIURETIC PEPTIDE
B NATRIURETIC PEPTIDE 5: 1212.7 pg/mL — AB (ref 0.0–100.0)
Pro B Natriuretic peptide (BNP): 839 pg/mL — ABNORMAL HIGH (ref 0.0–100.0)

## 2015-03-12 LAB — BASIC METABOLIC PANEL
Anion gap: 11 (ref 5–15)
BUN: 38 mg/dL — AB (ref 6–20)
CALCIUM: 9.6 mg/dL (ref 8.9–10.3)
CO2: 23 mmol/L (ref 22–32)
CREATININE: 1.8 mg/dL — AB (ref 0.61–1.24)
Chloride: 111 mmol/L (ref 101–111)
GFR, EST AFRICAN AMERICAN: 38 mL/min — AB (ref >60)
GFR, EST NON AFRICAN AMERICAN: 33 mL/min — AB (ref >60)
Glucose, Bld: 102 mg/dL — ABNORMAL HIGH (ref 65–99)
Potassium: 5.2 mmol/L — ABNORMAL HIGH (ref 3.5–5.1)
SODIUM: 145 mmol/L (ref 135–145)

## 2015-03-12 MED ORDER — TAMSULOSIN HCL 0.4 MG PO CAPS
0.4000 mg | ORAL_CAPSULE | Freq: Every day | ORAL | Status: DC
  Administered 2015-03-13 – 2015-03-22 (×10): 0.4 mg via ORAL
  Filled 2015-03-12 (×10): qty 1

## 2015-03-12 MED ORDER — MORPHINE SULFATE (PF) 2 MG/ML IV SOLN
2.0000 mg | INTRAVENOUS | Status: DC | PRN
  Administered 2015-03-20: 2 mg via INTRAVENOUS
  Filled 2015-03-12: qty 1

## 2015-03-12 MED ORDER — OXYCODONE HCL 5 MG PO TABS
5.0000 mg | ORAL_TABLET | Freq: Two times a day (BID) | ORAL | Status: DC | PRN
  Administered 2015-03-19 – 2015-03-20 (×3): 5 mg via ORAL
  Filled 2015-03-12 (×3): qty 1

## 2015-03-12 MED ORDER — ACETAMINOPHEN 500 MG PO TABS
500.0000 mg | ORAL_TABLET | Freq: Three times a day (TID) | ORAL | Status: DC
  Administered 2015-03-13 – 2015-03-16 (×10): 500 mg via ORAL
  Filled 2015-03-12 (×13): qty 1

## 2015-03-12 MED ORDER — VANCOMYCIN HCL IN DEXTROSE 1-5 GM/200ML-% IV SOLN
1000.0000 mg | INTRAVENOUS | Status: DC
  Administered 2015-03-12 – 2015-03-16 (×5): 1000 mg via INTRAVENOUS
  Filled 2015-03-12 (×8): qty 200

## 2015-03-12 MED ORDER — FUROSEMIDE 10 MG/ML IJ SOLN
40.0000 mg | Freq: Two times a day (BID) | INTRAMUSCULAR | Status: DC
  Administered 2015-03-13 – 2015-03-18 (×11): 40 mg via INTRAVENOUS
  Filled 2015-03-12 (×11): qty 4

## 2015-03-12 MED ORDER — ALLOPURINOL 300 MG PO TABS
300.0000 mg | ORAL_TABLET | Freq: Every day | ORAL | Status: DC
  Administered 2015-03-13 – 2015-03-22 (×10): 300 mg via ORAL
  Filled 2015-03-12 (×10): qty 1

## 2015-03-12 MED ORDER — SODIUM CHLORIDE 0.9% FLUSH
3.0000 mL | Freq: Two times a day (BID) | INTRAVENOUS | Status: DC
  Administered 2015-03-13 – 2015-03-22 (×16): 3 mL via INTRAVENOUS

## 2015-03-12 MED ORDER — ALPRAZOLAM 0.5 MG PO TABS
0.5000 mg | ORAL_TABLET | Freq: Three times a day (TID) | ORAL | Status: DC | PRN
  Administered 2015-03-20 – 2015-03-21 (×2): 0.5 mg via ORAL
  Filled 2015-03-12 (×2): qty 1

## 2015-03-12 MED ORDER — FLUTICASONE PROPIONATE 50 MCG/ACT NA SUSP
2.0000 | Freq: Every day | NASAL | Status: DC
  Administered 2015-03-13 – 2015-03-22 (×6): 2 via NASAL
  Filled 2015-03-12 (×3): qty 16

## 2015-03-12 MED ORDER — FUROSEMIDE 10 MG/ML IJ SOLN
40.0000 mg | Freq: Once | INTRAMUSCULAR | Status: AC
  Administered 2015-03-12: 40 mg via INTRAVENOUS
  Filled 2015-03-12: qty 4

## 2015-03-12 MED ORDER — TERAZOSIN HCL 5 MG PO CAPS
10.0000 mg | ORAL_CAPSULE | Freq: Once | ORAL | Status: AC
  Administered 2015-03-13: 10 mg via ORAL
  Filled 2015-03-12 (×2): qty 2

## 2015-03-12 MED ORDER — APIXABAN 2.5 MG PO TABS
2.5000 mg | ORAL_TABLET | Freq: Two times a day (BID) | ORAL | Status: DC
  Administered 2015-03-13 – 2015-03-16 (×8): 2.5 mg via ORAL
  Filled 2015-03-12 (×8): qty 1

## 2015-03-12 MED ORDER — ONDANSETRON HCL 4 MG PO TABS
4.0000 mg | ORAL_TABLET | Freq: Four times a day (QID) | ORAL | Status: DC | PRN
  Administered 2015-03-15 – 2015-03-19 (×6): 4 mg via ORAL
  Filled 2015-03-12 (×6): qty 1

## 2015-03-12 MED ORDER — ONDANSETRON HCL 4 MG/2ML IJ SOLN
4.0000 mg | Freq: Four times a day (QID) | INTRAMUSCULAR | Status: DC | PRN
  Administered 2015-03-14 – 2015-03-15 (×2): 4 mg via INTRAVENOUS
  Filled 2015-03-12 (×2): qty 2

## 2015-03-12 MED ORDER — ALBUTEROL SULFATE (2.5 MG/3ML) 0.083% IN NEBU: 2.5000 mg | INHALATION_SOLUTION | RESPIRATORY_TRACT | Status: DC | PRN

## 2015-03-12 MED ORDER — SPIRONOLACTONE 25 MG PO TABS
25.0000 mg | ORAL_TABLET | Freq: Every day | ORAL | Status: DC
  Administered 2015-03-13 – 2015-03-15 (×3): 25 mg via ORAL
  Filled 2015-03-12 (×3): qty 1

## 2015-03-12 MED ORDER — SIMVASTATIN 40 MG PO TABS
40.0000 mg | ORAL_TABLET | Freq: Every day | ORAL | Status: DC
  Administered 2015-03-13 – 2015-03-21 (×9): 40 mg via ORAL
  Filled 2015-03-12 (×10): qty 1

## 2015-03-12 NOTE — ED Notes (Signed)
X-ray at bedside

## 2015-03-12 NOTE — ED Notes (Signed)
Attempted report x1. 

## 2015-03-12 NOTE — H&P (Signed)
[facility] History and Physical  Luis Glenn [id number] DOB: 01/20/1928 DOA: 03/12/2015  Referring physician: ED PCP: Binnie Rail, MD   Chief Complaint: Lower leg swelling.  HPI:  [name] is a 82 year old male with a past medical history significant for HTN, PVD, HLD, diabetes mellitus type 2, depression, BPH, pericardial effusion last echo in 06/2013; who presents with complaints of leg swelling. Patient notes a history of  chronic lower extremity edema.  In the last week to week and half he's complained of more leg swelling than usual.  His left leg had a blister that had popped approximately 3-4 days ago and left a big hole in his leg. He initially thought he had urinated on himself because there was so much fluid. Subsequently thereafter developed increasing redness of his left leg. Patient denies any fever, chills. He does not regularly monitor his weight and due to instability on his feet. He notes increasing shortness of breath that has been worse than normal. He was seen by his primary care provider who recommended him to come to the emergency department after talks with patient's cardiologist as his creatinine had slowly crept up. Cardiologist reports a baseline creatinine approximately 1.4 patient's he's Dr. Mertie Moores of cardiology.   Upo n admission patient was evaluated and initial lab work showed an elevated BNP of 1212.7   Review of Systems  Constitutional: Positive for malaise/fatigue.  HENT: Negative for ear pain and tinnitus.   Eyes: Negative for double vision and photophobia.  Respiratory: Positive for shortness of breath and wheezing.   Cardiovascular: Positive for leg swelling. Negative for chest pain.  Gastrointestinal: Negative for vomiting and abdominal pain.  Genitourinary: Negative for urgency and frequency.  Musculoskeletal: Positive for joint pain and falls.  Skin: Positive for rash. Negative for itching.  Neurological: Positive for weakness.  Negative for tingling, tremors and loss of consciousness.  Endo/Heme/Allergies: Negative for environmental allergies. Bruises/bleeds easily.  Psychiatric/Behavioral: Negative for hallucinations and substance abuse.      Past Medical History  Diagnosis Date  . GOUT   . PERIPHERAL VASCULAR DISEASE   . DIVERTICULOSIS, COLON   . BENIGN PROSTATIC HYPERTROPHY   . DEPRESSION   . DIABETES MELLITUS, TYPE II   . GERD   . HYPERLIPIDEMIA   . HYPERTENSION   . CATARACTS   . OSTEOARTHRITIS   . ANXIETY   . ALLERGIC RHINITIS      Past Surgical History  Procedure Laterality Date  . Tonsillectomy  10/1942      Social History:  reports that he quit smoking about 26 years ago. His smoking use included Cigarettes. He has a 120 pack-year smoking history. He does not have any smokeless tobacco history on file. He reports that he drinks alcohol. He reports that he does not use illicit drugs. Where does patient live--home  and with whom if at home? Alone Can patient participate in ADLs? Yes, utilizing scooters and safety equipment around the home  Allergies  Allergen Reactions  . Penicillins Swelling and Rash    Has patient had a PCN reaction causing immediate rash, facial/tongue/throat swelling, SOB or lightheadedness with hypotension: NO Has patient had a PCN reaction causing severe rash involving mucus membranes or skin necrosis:NO Has patient had a PCN reaction that required hospitalization No Has patient had a PCN reaction occurring within the last 10 years: NO If all of the above answers are "NO", then may proceed with Cephalosporin use.    Family History  Problem Relation Age  of Onset  . Diabetes Mother   . Hypertension Mother   . Diabetes Father   . Hypertension Father   . Alcohol abuse Other   . Breast cancer Other       Prior to Admission medications   Medication Sig Start Date End Date Taking? Authorizing Provider  acetaminophen (TYLENOL) 500 MG tablet Take 500 mg by mouth  4 (four) times daily - after meals and at bedtime. Take 4 times every day per patient   Yes Historical Provider, MD  allopurinol (ZYLOPRIM) 300 MG tablet Take 1 tablet (300 mg total) by mouth daily. 08/09/14  Yes Rowe Clack, MD  ALPRAZolam Duanne Moron) 0.5 MG tablet Take 1 tablet (0.5 mg total) by mouth 3 (three) times daily as needed for anxiety. 01/09/15  Yes Hendricks Limes, MD  amLODipine-benazepril (LOTREL) 10-20 MG per capsule TAKE 1 CAPSULE BY MOUTH DAILY 07/02/14  Yes Rowe Clack, MD  apixaban (ELIQUIS) 2.5 MG TABS tablet Take 1 tablet (2.5 mg total) by mouth 2 (two) times daily. 12/28/14  Yes Thayer Headings, MD  fluticasone (FLONASE) 50 MCG/ACT nasal spray Place 2 sprays into both nostrils daily. 12/30/12  Yes Rowe Clack, MD  furosemide (LASIX) 40 MG tablet Take 1 tablet (40 mg total) by mouth 2 (two) times daily. 09/25/14  Yes Rowe Clack, MD  oxyCODONE (OXY IR/ROXICODONE) 5 MG immediate release tablet Take 1 tablet (5 mg total) by mouth 2 (two) times daily as needed for severe pain. 03/11/15  Yes Binnie Rail, MD  Potassium Chloride ER 20 MEQ TBCR TAKE 1 TABLET BY MOUTH DAILY 04/13/14  Yes Rowe Clack, MD  simvastatin (ZOCOR) 40 MG tablet Take 1 tablet (40 mg total) by mouth daily at 6 PM. 01/31/15  Yes Binnie Rail, MD  spironolactone (ALDACTONE) 25 MG tablet Take 1 tablet (25 mg total) by mouth daily. 03/11/15  Yes Binnie Rail, MD  tamsulosin (FLOMAX) 0.4 MG CAPS capsule TAKE ONE CAPSULE BY MOUTH DAILY 12/14/14  Yes Binnie Rail, MD  terazosin (HYTRIN) 10 MG capsule TAKE 1 CAPSULE (10 MG TOTAL) BY MOUTH DAILY. 01/29/15  Yes Binnie Rail, MD  FLUoxetine (PROZAC) 10 MG capsule Take 1 capsule (10 mg total) by mouth daily. Patient not taking: Reported on 03/12/2015 12/14/14   Binnie Rail, MD     Physical Exam: Filed Vitals:   03/12/15 1930 03/12/15 1945 03/12/15 2000 03/12/15 2015  BP: 133/57 131/60 135/57 127/58  Pulse: 85 79 76 72  Resp: 17 17 17 17    SpO2: 97% 97% 97% 97%     Constitutional: Vital signs reviewed. Patient is a obese male easily winded with giving history speaking in shortened sentences Head: Normocephalic and atraumatic  Ear: TM normal bilaterally  Mouth: no erythema or exudates, MMM  Eyes: PERRL, EOMI, conjunctivae normal, No scleral icterus.  Neck: Supple, Trachea midline normal ROM, No JVD, mass, thyromegaly, or carotid bruit present.  Cardiovascular: RRR, S1 normal, S2 normal, no MRG, pulses symmetric and intact bilaterally  Pulmonary/Chest: Decreased aeration with scattered crackles appreciated bilaterally Abdominal: Soft. Non-tender, non-distended, bowel sounds are normal, no masses, organomegaly, or guarding present.  GU: no CVA tenderness Musculoskeletal: No joint deformities, erythema, or stiffness, ROM full and no nontender Ext: 3+ pitting edema of the bilateral lower extremities and no cyanosis, pulses palpable bilaterally (DP and PT)  Hematology: no cervical, inginal, or axillary adenopathy.  Neurological: A&O x3, Strenght is normal and symmetric bilaterally, cranial nerve II-XII  are grossly intact, no focal motor deficit, sensory intact to light touch bilaterally.  Skin: Erythema and swelling noted of the anterior left shin.  Psychiatric: Normal mood and affect. speech and behavior is normal. Judgment and thought content normal. Cognition and memory are normal.      Data Review   Micro Results No results found for this or any previous visit (from the past 240 hour(s)).  Radiology Reports Dg Chest Port 1 View  03/12/2015  CLINICAL DATA:  Shortness of breath and known pericardial effusion. EXAM: PORTABLE CHEST 1 VIEW COMPARISON:  CT 07/11/2014 FINDINGS: Single view of the chest again demonstrates enlargement of the cardiac silhouette. Findings are compatible with a large amount of pericardial fluid. In addition, there are prominent central vascular structures and concern for some pulmonary edema. Limited  evaluation of the left lung base due to the enlarged cardiac silhouette. IMPRESSION: Enlarged cardiac silhouette related to a large pericardial effusion. Evidence for vascular congestion or mild pulmonary edema. Electronically Signed   By: Markus Daft M.D.   On: 03/12/2015 19:06     CBC  Recent Labs Lab 03/12/15 1934  WBC 4.9  HGB 11.5*  HCT 36.7*  PLT 184  MCV 92.7  MCH 29.0  MCHC 31.3  RDW 15.8*  LYMPHSABS 0.6*  MONOABS 0.3  EOSABS 0.1  BASOSABS 0.0    Chemistries   Recent Labs Lab 03/11/15 1725 03/12/15 1934  NA 141 145  K 5.3* 5.2*  CL 110 111  CO2 23 23  GLUCOSE 109* 102*  BUN 42* 38*  CREATININE 1.89* 1.80*  CALCIUM 8.8 9.6  AST 16  --   ALT 9  --   ALKPHOS 107  --   BILITOT 0.6  --    ------------------------------------------------------------------------------------------------------------------ CrCl cannot be calculated (Unknown ideal weight.). ------------------------------------------------------------------------------------------------------------------ No results for input(s): HGBA1C in the last 72 hours. ------------------------------------------------------------------------------------------------------------------ No results for input(s): CHOL, HDL, LDLCALC, TRIG, CHOLHDL, LDLDIRECT in the last 72 hours. ------------------------------------------------------------------------------------------------------------------ No results for input(s): TSH, T4TOTAL, T3FREE, THYROIDAB in the last 72 hours.  Invalid input(s): FREET3 ------------------------------------------------------------------------------------------------------------------ No results for input(s): VITAMINB12, FOLATE, FERRITIN, TIBC, IRON, RETICCTPCT in the last 72 hours.  Coagulation profile No results for input(s): INR, PROTIME in the last 168 hours.  No results for input(s): DDIMER in the last 72 hours.  Cardiac Enzymes No results for input(s): CKMB, TROPONINI, MYOGLOBIN in the  last 168 hours.  Invalid input(s): CK ------------------------------------------------------------------------------------------------------------------ Invalid input(s): POCBNP   CBG: No results for input(s): GLUCAP in the last 168 hours.     EKG: Independently reviewed. Atrial fibrillation   Assessment/Plan Active Problems: Acute on chronic congestive heart failure: Patient reports increasing lower extremity edema. BNP elevated at 1212. - Strict ins and outs - Continue spironolactone - Lasix 40 mg IV every 12 hours - Repeat echocardiogram in a  - Consult cardiology in a.m.    Cellulitis: Acute. Patient with redness of the anterior shin of the left leg. Patient appears to be afebrile white blood cell count 4.9. - Admit to a telemetry bed - Antibiotics of vancomycin patient has allergy to penicillins - Follow-up blood cultures - Wound care consult  Atrial fibrillation on Eliquis: Appears rate controlled chadsvasc score 4 - Continue Eliquis   Pericardial effusion. Last echo 06/2014 which was noted to be a large effusion with possibility of causing hemodynamic instability. Patient apparently declined pericardial window at that time. -  follow-up cardiology recommendations   Chronic kidney disease stage III: Patient's baseline creatinine has been anywhere from  1.58-1.85 over the last year. On presentation patient's creatinine is elevated at 1.8 - Continue to monitor  Essential Hypertension - Continue terazosin  Hyperkalemia: Potassium elevated at 5.2 on admission, no EKG changes observed, and patient was given 40 mg of IV Lasix. - Recheck BMP in a.m.  Anemia: hemoglobin is 11.5 - Continue to monitor  Hyperlipidemia - continue simvastatin  BPH -Continue Flomax   Code Status:   full Family Communication: bedside Disposition Plan: admit   Total time spent 55 minutes.Greater than 50% of this time was spent in counseling, explanation of diagnosis, planning of  further management, and coordination of care  [facility] Pager [phone]  If 7PM-7AM, please contact night-coverage [email or web address] Password [facility] 03/12/2015, 9:53 PM

## 2015-03-12 NOTE — Telephone Encounter (Deleted)
Spoke with patient who states he was advised by his insurance company that [employer]

## 2015-03-12 NOTE — ED Provider Notes (Signed)
CSN: [id number]     Arrival date & time 03/12/15  1815 History   First MD Initiated Contact with Patient 03/12/15 1815     Chief Complaint  Patient presents with  . Leg Swelling      HPI  Luis Glenn rales presents for evaluation via [facility] from home. His chief complaint is redness and a ruptured blister on his left leg, increasing edema to both lower legs.  He states he has had chronic edema in the legs. He states is Markedly worse in the last week. He has a known history of pericardial effusion. He has been offered and discussed pericardial window and declines. He has chronic edema and occasional CHF symptoms and therefore takes Lasix. He has chronic dependent edema and gets frequent cellulitis. He developed redness and blistering of his legs a week ago. This became worse over the last 3 days with ruptured blistering and increasing redness in his left leg. Now to his right leg as well. His creatinine increased over the last few days to slightly above his baseline. His primary care physician became concerned and discussed with his cardiologist about inpatient admission.  Past Medical History  Diagnosis Date  . GOUT   . PERIPHERAL VASCULAR DISEASE   . DIVERTICULOSIS, COLON   . BENIGN PROSTATIC HYPERTROPHY   . DEPRESSION   . DIABETES MELLITUS, TYPE II   . GERD   . HYPERLIPIDEMIA   . HYPERTENSION   . CATARACTS   . OSTEOARTHRITIS   . ANXIETY   . ALLERGIC RHINITIS    Past Surgical History  Procedure Laterality Date  . Tonsillectomy  10/1942   Family History  Problem Relation Age of Onset  . Diabetes Mother   . Hypertension Mother   . Diabetes Father   . Hypertension Father   . Alcohol abuse Other   . Breast cancer Other    Social History  Substance Use Topics  . Smoking status: Former Smoker -- 3.00 packs/day for 40 years    Types: Cigarettes    Quit date: 02/09/1989  . Smokeless tobacco: None  . Alcohol Use: Yes     Comment: occassional    Review of Systems    Constitutional: Negative for fever, chills, diaphoresis, appetite change and fatigue.  HENT: Negative for mouth sores, sore throat and trouble swallowing.   Eyes: Negative for visual disturbance.  Respiratory: Negative for cough, chest tightness, shortness of breath and wheezing.   Cardiovascular: Positive for leg swelling. Negative for chest pain.  Gastrointestinal: Negative for nausea, vomiting, abdominal pain, diarrhea and abdominal distention.  Endocrine: Negative for polydipsia, polyphagia and polyuria.  Genitourinary: Negative for dysuria, frequency and hematuria.  Musculoskeletal: Negative for gait problem.  Skin: Positive for color change. Negative for pallor and rash.  Neurological: Negative for dizziness, syncope, light-headedness and headaches.  Hematological: Does not bruise/bleed easily.  Psychiatric/Behavioral: Negative for behavioral problems and confusion.      Allergies  Penicillins  Home Medications   Prior to Admission medications   Medication Sig Start Date End Date Taking? Authorizing Provider  acetaminophen (TYLENOL) 500 MG tablet Take 500 mg by mouth 4 (four) times daily - after meals and at bedtime. Take 4 times every day per patient   Yes Historical Provider, MD  allopurinol (ZYLOPRIM) 300 MG tablet Take 1 tablet (300 mg total) by mouth daily. 08/09/14  Yes Rowe Clack, MD  ALPRAZolam Duanne Moron) 0.5 MG tablet Take 1 tablet (0.5 mg total) by mouth 3 (three) times daily as needed  for anxiety. 01/09/15  Yes Hendricks Limes, MD  amLODipine-benazepril (LOTREL) 10-20 MG per capsule TAKE 1 CAPSULE BY MOUTH DAILY 07/02/14  Yes Rowe Clack, MD  apixaban (ELIQUIS) 2.5 MG TABS tablet Take 1 tablet (2.5 mg total) by mouth 2 (two) times daily. 12/28/14  Yes Thayer Headings, MD  fluticasone (FLONASE) 50 MCG/ACT nasal spray Place 2 sprays into both nostrils daily. 12/30/12  Yes Rowe Clack, MD  furosemide (LASIX) 40 MG tablet Take 1 tablet (40 mg total) by  mouth 2 (two) times daily. 09/25/14  Yes Rowe Clack, MD  oxyCODONE (OXY IR/ROXICODONE) 5 MG immediate release tablet Take 1 tablet (5 mg total) by mouth 2 (two) times daily as needed for severe pain. 03/11/15  Yes Binnie Rail, MD  Potassium Chloride ER 20 MEQ TBCR TAKE 1 TABLET BY MOUTH DAILY 04/13/14  Yes Rowe Clack, MD  simvastatin (ZOCOR) 40 MG tablet Take 1 tablet (40 mg total) by mouth daily at 6 PM. 01/31/15  Yes Binnie Rail, MD  spironolactone (ALDACTONE) 25 MG tablet Take 1 tablet (25 mg total) by mouth daily. 03/11/15  Yes Binnie Rail, MD  tamsulosin (FLOMAX) 0.4 MG CAPS capsule TAKE ONE CAPSULE BY MOUTH DAILY 12/14/14  Yes Binnie Rail, MD  terazosin (HYTRIN) 10 MG capsule TAKE 1 CAPSULE (10 MG TOTAL) BY MOUTH DAILY. 01/29/15  Yes Binnie Rail, MD  FLUoxetine (PROZAC) 10 MG capsule Take 1 capsule (10 mg total) by mouth daily. Patient not taking: Reported on 03/12/2015 12/14/14   Binnie Rail, MD   BP 127/58 mmHg  Pulse 72  Resp 17  SpO2 97% Physical Exam  Constitutional: He is oriented to person, place, and time. He appears well-developed and well-nourished. No distress.  HENT:  Head: Normocephalic.  Eyes: Conjunctivae are normal. Pupils are equal, round, and reactive to light. No scleral icterus.  Neck: Normal range of motion. Neck supple. No thyromegaly present.  Cardiovascular: Normal rate and regular rhythm.  Exam reveals no gallop and no friction rub.   No murmur heard. Pulmonary/Chest: Effort normal and breath sounds normal. No respiratory distress. He has no wheezes. He has no rales.  Abdominal: Soft. Bowel sounds are normal. He exhibits no distension. There is no tenderness. There is no rebound.  Musculoskeletal: Normal range of motion.  Neurological: He is alert and oriented to person, place, and time.  Skin: Skin is warm and dry. No rash noted.     Psychiatric: He has a normal mood and affect. His behavior is normal.    ED Course  Procedures  (including critical care time) Labs Review Labs Reviewed  CBC WITH DIFFERENTIAL/PLATELET - Abnormal; Notable for the following:    RBC 3.96 (*)    Hemoglobin 11.5 (*)    HCT 36.7 (*)    RDW 15.8 (*)    Lymphs Abs 0.6 (*)    All other components within normal limits  BRAIN NATRIURETIC PEPTIDE - Abnormal; Notable for the following:    B Natriuretic Peptide 1212.7 (*)    All other components within normal limits  BASIC METABOLIC PANEL - Abnormal; Notable for the following:    Potassium 5.2 (*)    Glucose, Bld 102 (*)    BUN 38 (*)    Creatinine, Ser 1.80 (*)    GFR calc non Af Amer 33 (*)    GFR calc Af Amer 38 (*)    All other components within normal limits  CULTURE, BLOOD (ROUTINE  X 2)  CULTURE, BLOOD (ROUTINE X 2)    Imaging Review Dg Chest Port 1 View  03/12/2015  CLINICAL DATA:  Shortness of breath and known pericardial effusion. EXAM: PORTABLE CHEST 1 VIEW COMPARISON:  CT 07/11/2014 FINDINGS: Single view of the chest again demonstrates enlargement of the cardiac silhouette. Findings are compatible with a large amount of pericardial fluid. In addition, there are prominent central vascular structures and concern for some pulmonary edema. Limited evaluation of the left lung base due to the enlarged cardiac silhouette. IMPRESSION: Enlarged cardiac silhouette related to a large pericardial effusion. Evidence for vascular congestion or mild pulmonary edema. Electronically Signed   By: Markus Daft M.D.   On: 03/12/2015 19:06   I have personally reviewed and evaluated these images and lab results as part of my medical decision-making.   EKG Interpretation None      MDM   Final diagnoses:  Cellulitis of left lower extremity  Dependent edema    Potassium 5.2. Creatinine 1.8 his baseline is 1.4. Did receive 4.9. Hemoglobin 11.5. Chest x-ray shows a large cardiac silhouette consistent with effusion but no sign of congestive heart failure. Clinically he does not show left side  congestive heart failure. No JVD no S4 or S3 gallop. No crackles. He does not have An odd physiology and is not dyspneic. Discussed admission with hospitalist for diuresis. He was given 40 IV Lasix. After blood cultures obtained given vancomycin IV.    Tanna Furry, MD 03/12/15 2127

## 2015-03-12 NOTE — Progress Notes (Signed)
Pharmacy Antibiotic Note  Luis Glenn is a 82 y.o. male admitted on 03/12/2015 with cellulitis.  Pharmacy has been consulted for Vancomycin dosing.  Plan: Vancomycin 1g IV every 24 hours.  Goal trough 10-15 mcg/mL.  Monitor culture data, renal function and clinical course [facility] at [facility]     No data recorded.   Recent Labs Lab 03/11/15 1725 03/12/15 1934  WBC  --  4.9  CREATININE 1.89* 1.80*    CrCl cannot be calculated (Unknown ideal weight.).    Allergies  Allergen Reactions  . Penicillins Swelling and Rash    Has patient had a PCN reaction causing immediate rash, facial/tongue/throat swelling, SOB or lightheadedness with hypotension: NO Has patient had a PCN reaction causing severe rash involving mucus membranes or skin necrosis:NO Has patient had a PCN reaction that required hospitalization No Has patient had a PCN reaction occurring within the last 10 years: NO If all of the above answers are "NO", then may proceed with Cephalosporin use.    Antimicrobials this admission: Vanc 1/31 >>    >>   Dose adjustments this admission:   Microbiology results: 1/31 BCx: sent  UCx:    Sputum:    MRSA PCR:   Andrey Cota. Diona Foley, PharmD, [place] Pager [phone] 03/12/2015 8:47 PM

## 2015-03-12 NOTE — ED Notes (Signed)
Pt arrives from home via [facility]. Pt has a home health RN that states the pt edema has drastically increased since her home visit on Friday. Pt is currently on lasix for edema. EMS reports the pt PCP and urologist have speculated the pt is in renal failure and therefore had him come to ED for evaluation.

## 2015-03-12 NOTE — Telephone Encounter (Signed)
Raford Pitcher,  This is Lovena Le, Dr Quay Burow' assistant. Unfortunately, your father does need to be in the hospital. Dr Quay Burow spoke with his cardiologist and he agrees. At his visit yesterday, she noticed fluid all over his body, not just in his legs. She does believe that this is coming from the heart failure. I will be contacting your father to let him know this. If you have any other questions, please let me know.   Thanks, Geni Bers, CMA AAMA,

## 2015-03-12 NOTE — Progress Notes (Signed)
Admitted pt from [id number]. Tele on VS taken and recorded. Oriented to call bell and room.

## 2015-03-13 ENCOUNTER — Encounter (HOSPITAL_COMMUNITY): Payer: Self-pay | Admitting: *Deleted

## 2015-03-13 DIAGNOSIS — Z880 Allergy status to penicillin: Secondary | ICD-10-CM | POA: Diagnosis not present

## 2015-03-13 DIAGNOSIS — Z833 Family history of diabetes mellitus: Secondary | ICD-10-CM | POA: Diagnosis not present

## 2015-03-13 DIAGNOSIS — I10 Essential (primary) hypertension: Secondary | ICD-10-CM | POA: Diagnosis not present

## 2015-03-13 DIAGNOSIS — N183 Chronic kidney disease, stage 3 unspecified: Secondary | ICD-10-CM | POA: Diagnosis present

## 2015-03-13 DIAGNOSIS — F419 Anxiety disorder, unspecified: Secondary | ICD-10-CM | POA: Diagnosis present

## 2015-03-13 DIAGNOSIS — I5033 Acute on chronic diastolic (congestive) heart failure: Secondary | ICD-10-CM | POA: Diagnosis not present

## 2015-03-13 DIAGNOSIS — E875 Hyperkalemia: Secondary | ICD-10-CM | POA: Diagnosis present

## 2015-03-13 DIAGNOSIS — Z7901 Long term (current) use of anticoagulants: Secondary | ICD-10-CM | POA: Diagnosis not present

## 2015-03-13 DIAGNOSIS — R609 Edema, unspecified: Secondary | ICD-10-CM | POA: Diagnosis not present

## 2015-03-13 DIAGNOSIS — M109 Gout, unspecified: Secondary | ICD-10-CM | POA: Diagnosis present

## 2015-03-13 DIAGNOSIS — N401 Enlarged prostate with lower urinary tract symptoms: Secondary | ICD-10-CM | POA: Diagnosis not present

## 2015-03-13 DIAGNOSIS — Z87891 Personal history of nicotine dependence: Secondary | ICD-10-CM | POA: Diagnosis not present

## 2015-03-13 DIAGNOSIS — D649 Anemia, unspecified: Secondary | ICD-10-CM

## 2015-03-13 DIAGNOSIS — M1990 Unspecified osteoarthritis, unspecified site: Secondary | ICD-10-CM | POA: Diagnosis present

## 2015-03-13 DIAGNOSIS — M6281 Muscle weakness (generalized): Secondary | ICD-10-CM | POA: Diagnosis not present

## 2015-03-13 DIAGNOSIS — R2681 Unsteadiness on feet: Secondary | ICD-10-CM | POA: Diagnosis present

## 2015-03-13 DIAGNOSIS — I272 Other secondary pulmonary hypertension: Secondary | ICD-10-CM | POA: Diagnosis present

## 2015-03-13 DIAGNOSIS — I319 Disease of pericardium, unspecified: Secondary | ICD-10-CM | POA: Diagnosis not present

## 2015-03-13 DIAGNOSIS — E785 Hyperlipidemia, unspecified: Secondary | ICD-10-CM | POA: Diagnosis present

## 2015-03-13 DIAGNOSIS — E1122 Type 2 diabetes mellitus with diabetic chronic kidney disease: Secondary | ICD-10-CM | POA: Diagnosis not present

## 2015-03-13 DIAGNOSIS — Z5189 Encounter for other specified aftercare: Secondary | ICD-10-CM | POA: Diagnosis not present

## 2015-03-13 DIAGNOSIS — I509 Heart failure, unspecified: Secondary | ICD-10-CM | POA: Diagnosis not present

## 2015-03-13 DIAGNOSIS — I313 Pericardial effusion (noninflammatory): Secondary | ICD-10-CM | POA: Diagnosis not present

## 2015-03-13 DIAGNOSIS — S80822A Blister (nonthermal), left lower leg, initial encounter: Secondary | ICD-10-CM | POA: Diagnosis present

## 2015-03-13 DIAGNOSIS — I482 Chronic atrial fibrillation: Secondary | ICD-10-CM | POA: Diagnosis not present

## 2015-03-13 DIAGNOSIS — M7989 Other specified soft tissue disorders: Secondary | ICD-10-CM | POA: Diagnosis not present

## 2015-03-13 DIAGNOSIS — J309 Allergic rhinitis, unspecified: Secondary | ICD-10-CM | POA: Diagnosis present

## 2015-03-13 DIAGNOSIS — K219 Gastro-esophageal reflux disease without esophagitis: Secondary | ICD-10-CM | POA: Diagnosis present

## 2015-03-13 DIAGNOSIS — I351 Nonrheumatic aortic (valve) insufficiency: Secondary | ICD-10-CM | POA: Diagnosis present

## 2015-03-13 DIAGNOSIS — N400 Enlarged prostate without lower urinary tract symptoms: Secondary | ICD-10-CM | POA: Diagnosis not present

## 2015-03-13 DIAGNOSIS — I878 Other specified disorders of veins: Secondary | ICD-10-CM | POA: Diagnosis present

## 2015-03-13 DIAGNOSIS — I480 Paroxysmal atrial fibrillation: Secondary | ICD-10-CM | POA: Diagnosis present

## 2015-03-13 DIAGNOSIS — Z7951 Long term (current) use of inhaled steroids: Secondary | ICD-10-CM | POA: Diagnosis not present

## 2015-03-13 DIAGNOSIS — R262 Difficulty in walking, not elsewhere classified: Secondary | ICD-10-CM | POA: Diagnosis not present

## 2015-03-13 DIAGNOSIS — R008 Other abnormalities of heart beat: Secondary | ICD-10-CM | POA: Diagnosis present

## 2015-03-13 DIAGNOSIS — I5032 Chronic diastolic (congestive) heart failure: Secondary | ICD-10-CM | POA: Diagnosis not present

## 2015-03-13 DIAGNOSIS — I4891 Unspecified atrial fibrillation: Secondary | ICD-10-CM | POA: Diagnosis not present

## 2015-03-13 DIAGNOSIS — L03116 Cellulitis of left lower limb: Secondary | ICD-10-CM | POA: Diagnosis not present

## 2015-03-13 DIAGNOSIS — I130 Hypertensive heart and chronic kidney disease with heart failure and stage 1 through stage 4 chronic kidney disease, or unspecified chronic kidney disease: Secondary | ICD-10-CM | POA: Diagnosis present

## 2015-03-13 DIAGNOSIS — Z8249 Family history of ischemic heart disease and other diseases of the circulatory system: Secondary | ICD-10-CM | POA: Diagnosis not present

## 2015-03-13 DIAGNOSIS — E1151 Type 2 diabetes mellitus with diabetic peripheral angiopathy without gangrene: Secondary | ICD-10-CM | POA: Diagnosis not present

## 2015-03-13 LAB — BASIC METABOLIC PANEL
Anion gap: 9 (ref 5–15)
BUN: 37 mg/dL — AB (ref 6–20)
CALCIUM: 8.9 mg/dL (ref 8.9–10.3)
CO2: 23 mmol/L (ref 22–32)
CREATININE: 1.75 mg/dL — AB (ref 0.61–1.24)
Chloride: 111 mmol/L (ref 101–111)
GFR calc non Af Amer: 34 mL/min — ABNORMAL LOW (ref >60)
GFR, EST AFRICAN AMERICAN: 39 mL/min — AB (ref >60)
Glucose, Bld: 113 mg/dL — ABNORMAL HIGH (ref 65–99)
Potassium: 4.6 mmol/L (ref 3.5–5.1)
Sodium: 143 mmol/L (ref 135–145)

## 2015-03-13 LAB — CBC
HCT: 32.3 % — ABNORMAL LOW (ref 39.0–52.0)
Hemoglobin: 10 g/dL — ABNORMAL LOW (ref 13.0–17.0)
MCH: 28.7 pg (ref 26.0–34.0)
MCHC: 31 g/dL (ref 30.0–36.0)
MCV: 92.6 fL (ref 78.0–100.0)
PLATELETS: 179 10*3/uL (ref 150–400)
RBC: 3.49 MIL/uL — AB (ref 4.22–5.81)
RDW: 15.9 % — AB (ref 11.5–15.5)
WBC: 4.9 10*3/uL (ref 4.0–10.5)

## 2015-03-13 LAB — TSH: TSH: 1.571 u[IU]/mL (ref 0.350–4.500)

## 2015-03-13 LAB — TROPONIN I
Troponin I: <0.03 ng/mL (ref <0.031)
Troponin I: <0.03 ng/mL (ref <0.031)

## 2015-03-13 NOTE — Progress Notes (Signed)
Initial Nutrition Assessment  INTERVENTION:  Provide Snacks BID Provide Multivitamin with minerals daily Monitor PO intake for adequacy   NUTRITION DIAGNOSIS:   Inadequate oral intake related to poor appetite as evidenced by meal completion < 50%.   GOAL:   Patient will meet greater than or equal to 90% of their needs   MONITOR:   PO intake, Labs, Skin, I & O's, Weight trends  REASON FOR ASSESSMENT:   Malnutrition Screening Tool    ASSESSMENT:   82 year old male with a past medical history significant for HTN, PVD, HLD, diabetes mellitus type 2, depression, BPH, pericardial effusion last echo in 06/2013; who presents with complaints of leg swelling. Patient notes a history of chronic lower extremity edema.  Pt reports lack of appetite the past couple days and poor appetite with varied PO intake the past 6 months. He only drank some V8 juice yesterday and he ate 25% of breakfast this morning. He states that he used to weigh 230-240 lbs, he started counting calories and lost down to 180 lbs, and recently has gained weight. Sometimes he eats 1 meal per day, sometimes 3. He dislikes nutritional supplements, but snacks more often when his appetite is poor. Noted some mild muscle and fat wasting in face and arms per nutrition-focused physical exam. Encouraged adequate healthful PO intake.  Labs: elevated BUN/Creatinine, low hemoglobin  Diet Order:  Diet Heart Room service appropriate?: Yes; Fluid consistency:: Thin  Skin:  Wound (see comment) (cellulitis on left and right lower leg)  Last BM:  2/1  Height:   Ht Readings from Last 1 Encounters:  03/13/15 6\' 2"  (1.88 m)    Weight:   Wt Readings from Last 1 Encounters:  03/13/15 206 lb 14.4 oz (93.849 kg)    Ideal Body Weight:  86.4 kg  BMI:  Body mass index is 26.55 kg/(m^2).  Estimated Nutritional Needs:   Kcal:  2000-2200  Protein:  100-110 grams  Fluid:  2-2.2 L/day  EDUCATION NEEDS:   No education needs  identified at this time  [place], LDN Inpatient Clinical Dietitian Pager: [phone] After Hours Pager: [phone]

## 2015-03-13 NOTE — Progress Notes (Signed)
[facility] PROGRESS NOTE   Luis Glenn [id number] DOB: [birth year] DOA: 03/12/2015 PCP: Binnie Rail, MD  HPI/Subjective: Denies any acute issues, seen sitting at bedside after he finished his breakfast. Denies any fever or chills, denies any shortness of breath.  Assessment/Plan: Principal Problem:   CHF (congestive heart failure) (HCC) Active Problems:   Essential hypertension   BPH (benign prostatic hyperplasia)   Atrial fibrillation (HCC)   Anemia   Cellulitis of left leg   Chronic kidney disease, stage III (moderate)   Acute on chronic diastolic congestive heart failure:  -Patient reports increasing lower extremity edema. BNP elevated at 1212. Last 2-D echo showed LVEF of 60-65%. - Strict ins and outs - Continue spironolactone - Lasix 40 mg IV every 12 hours, following take/output and renal function closely. Elevate lower extremities. - Repeat echocardiogram in a.m.    Cellulitis: Acute,?.  -Patient with redness of the anterior shin of the left leg. Patient appears to be afebrile white blood cell count 4.9. - The redness appears to be hemosiderin discoloration from chronic congestion. -Cannot rule out cellulitis, will do short course of antibiotics.  Atrial fibrillation on Eliquis: Appears rate controlled chadsvasc score 4 - Continue Eliquis, no evidence of bleeding.  Pericardial effusion -Last echo 06/2014 which was noted to be a large effusion without hemodynamic effects. Patient apparently declined pericardial window at that time. -2-D echo to be repeated, x-ray showed large pericardial effusion  Chronic kidney disease stage III: Patient's baseline creatinine has been anywhere from 1.58-1.85 over the last year. On presentation patient's creatinine is elevated at 1.8 - Continue to monitor  Essential Hypertension - Continue terazosin  Hyperkalemia: Potassium elevated at 5.2 on admission, no EKG changes observed, and patient was given 40 mg of IV  Lasix. -Potassium is 4.6 today.  Anemia: hemoglobin is 11.5 -This is likely anemia of chronic kidney disease.  Hyperlipidemia - continue simvastatin  BPH -Continue Flomax   Code Status: Full Code Family Communication: Plan discussed with the patient. Disposition Plan: Remains inpatient Diet: Diet Heart Room service appropriate?: Yes; Fluid consistency:: Thin  Consultants:  None  Procedures:  None  Antibiotics:  Clindamycin (indicate start date, and stop date if known)   Objective: Filed Vitals:   03/13/15 0440 03/13/15 0900  BP: 121/47 134/61  Pulse: 80 70  Temp: 97.6 F (36.4 C)   Resp: 20     Intake/Output Summary (Last 24 hours) at 03/13/15 1053 Last data filed at 03/13/15 1000  Gross per 24 hour  Intake    540 ml  Output   1640 ml  Net  -1100 ml   Filed Weights   03/13/15 0010  Weight: 93.849 kg (206 lb 14.4 oz)    Exam: General: Alert and awake, oriented x3, not in any acute distress. HEENT: anicteric sclera, pupils reactive to light and accommodation, EOMI CVS: S1-S2 clear, no murmur rubs or gallops Chest: clear to auscultation bilaterally, no wheezing, rales or rhonchi Abdomen: soft nontender, nondistended, normal bowel sounds, no organomegaly Extremities: no cyanosis, clubbing or edema noted bilaterally Neuro: Cranial nerves II-XII intact, no focal neurological deficits  Data Reviewed: Basic Metabolic Panel:  Recent Labs Lab 03/11/15 1725 03/12/15 1934 03/13/15 0420  NA 141 145 143  K 5.3* 5.2* 4.6  CL 110 111 111  CO2 23 23 23   GLUCOSE 109* 102* 113*  BUN 42* 38* 37*  CREATININE 1.89* 1.80* 1.75*  CALCIUM 8.8 9.6 8.9   Liver Function Tests:  Recent Labs Lab 03/11/15 1725  AST 16  ALT 9  ALKPHOS 107  BILITOT 0.6  PROT 6.0  ALBUMIN 3.4*   No results for input(s): LIPASE, AMYLASE in the last 168 hours. No results for input(s): AMMONIA in the last 168 hours. CBC:  Recent Labs Lab 03/12/15 1934 03/13/15 0420  WBC  4.9 4.9  NEUTROABS 3.8  --   HGB 11.5* 10.0*  HCT 36.7* 32.3*  MCV 92.7 92.6  PLT 184 179   Cardiac Enzymes:  Recent Labs Lab 03/13/15 0045 03/13/15 0545  TROPONINI <0.03 <0.03   BNP (last 3 results)  Recent Labs  03/12/15 1934  BNP 1212.7*    ProBNP (last 3 results)  Recent Labs  03/11/15 1725  PROBNP 839.0*    CBG: No results for input(s): GLUCAP in the last 168 hours.  Micro No results found for this or any previous visit (from the past 240 hour(s)).   Studies: Dg Chest Port 1 View  03/12/2015  CLINICAL DATA:  Shortness of breath and known pericardial effusion. EXAM: PORTABLE CHEST 1 VIEW COMPARISON:  CT 07/11/2014 FINDINGS: Single view of the chest again demonstrates enlargement of the cardiac silhouette. Findings are compatible with a large amount of pericardial fluid. In addition, there are prominent central vascular structures and concern for some pulmonary edema. Limited evaluation of the left lung base due to the enlarged cardiac silhouette. IMPRESSION: Enlarged cardiac silhouette related to a large pericardial effusion. Evidence for vascular congestion or mild pulmonary edema. Electronically Signed   By: Markus Daft M.D.   On: 03/12/2015 19:06    Scheduled Meds: . acetaminophen  500 mg Oral TID PC & HS  . allopurinol  300 mg Oral Daily  . apixaban  2.5 mg Oral BID  . fluticasone  2 spray Each Nare Daily  . furosemide  40 mg Intravenous Q12H  . simvastatin  40 mg Oral q1800  . sodium chloride flush  3 mL Intravenous Q12H  . spironolactone  25 mg Oral Daily  . tamsulosin  0.4 mg Oral Daily  . vancomycin  1,000 mg Intravenous Q24H   Continuous Infusions:      Time spent: 35 minutes    [facility]  [facility] Pager [phone] If 7PM-7AM, please contact night-coverage at [email or web address], password [facility] 03/13/2015, 10:53 AM  LOS: 1 day

## 2015-03-13 NOTE — Consult Note (Signed)
WOC wound consult note Reason for Consult: Left LE cellulitis, bilateral edema Wound type:Venous insufficiency Pressure Ulcer POA: No Measurement:No wounds Wound bed:N/A Drainage (amount, consistency, odor) No exudate at this time.  Patient states that he had weeping edema last week, with elevation, this has resolved. Periwound:LLE with erythema and warmth; patient and bedside RN indicate this is better than initial presentation (on admission) Dressing procedure/placement/frequency: Patient has worn bilateral multilayer compression wraps in the past; when wounds or weeping dermatitis resolves, the wraps are discontinued and the condition returns-often worse than initially.  Patient will require continued wrapping or the provision of compression hosiery (which it is acknowledged are sometimes difficult to don and doff) for management of the venous edema.  I will implement bilateral Unnas boot today by ortho tech and suggest twice weekly changes on Tuesdays and Fridays.  If you agree, please arrange for [facility] (or continuation of HHRN) for this purpose.  Patient could also be referred to the outpatient wound care center of his choosing. [facility] nursing team will not follow, but will remain available to this patient, the nursing and medical teams.  Please re-consult if needed. Thanks, Maudie Flakes, MSN, RN, [place], Arther Abbott  Pager# [phone]

## 2015-03-13 NOTE — Care Management Obs Status (Signed)
[facility] NOTIFICATION   Patient Details  Name: JOELLE BRANCO MRN: [id number] Date of Birth: [birth year]   Medicare Observation Status Notification Given:  Yes    Royston Bake, RN 03/13/2015, 1:58 PM

## 2015-03-13 NOTE — Evaluation (Signed)
Physical Therapy Evaluation Patient Details Name: Luis Glenn MRN: [id number] DOB: 1927-11-28 Today's Date: 03/13/2015   History of Present Illness  82 year old male with a past medical history significant for HTN, PVD, HLD, diabetes mellitus type 2, depression, BPH, pericardial effusion last echo in 06/2013; who presents with complaints of leg swelling in setting of CHF exacerbation and LE cellulitis.  Clinical Impression  Patient demonstrates deficits in functional mobility as indicated below. Will need continued skilled PT to address deficits and maximize function. Will see as indicated and progress as tolerated.  OF NOTE: VSS throughout session, some DOE with increased ambulation, audible crackling with breathing    Follow Up Recommendations Home health PT;Supervision for mobility/OOB    Equipment Recommendations  None recommended by PT    Recommendations for Other Services       Precautions / Restrictions Precautions Precautions: Fall      Mobility  Bed Mobility Overal bed mobility: Needs Assistance Bed Mobility: Supine to Sit;Sit to Supine     Supine to sit: Supervision Sit to supine: Min assist   General bed mobility comments: min assist to elevate bilateral LEs back to bed  Transfers Overall transfer level: Needs assistance Equipment used: Rolling walker (2 wheeled) Transfers: Sit to/from Stand Sit to Stand: Min guard         General transfer comment: VCs for positioning at EOB, elevated surface, 2 attempts to power up to standing  Ambulation/Gait Ambulation/Gait assistance: Supervision Ambulation Distance (Feet): 110 Feet Assistive device: Rolling walker (2 wheeled) Gait Pattern/deviations: Decreased stride length;Step-through pattern;Trunk flexed;Wide base of support Gait velocity: modestly decreased Gait velocity interpretation: at or above normal speed for age/gender General Gait Details: fatigues quickly, HR stable throughout session, ambulated on  room air, some DOE with increased distance. O2 saturations >93%  Stairs            Wheelchair Mobility    Modified Rankin (Stroke Patients Only)       Balance Overall balance assessment: Needs assistance Sitting-balance support: Feet supported Sitting balance-Leahy Scale: Good       Standing balance-Leahy Scale: Fair Standing balance comment:  (able to stand without support during functional activity)                             Pertinent Vitals/Pain Pain Assessment: 0-10 Pain Score: 4  Pain Location: back and bilateral knees Pain Descriptors / Indicators: Aching;Heaviness;Sore Pain Intervention(s): Limited activity within patient's tolerance;Monitored during session;Repositioned;Relaxation    Home Living Family/patient expects to be discharged to:: Private residence Living Arrangements: Alone Available Help at Discharge: Family;Available PRN/intermittently Type of Home: House Home Access: Level entry;Stairs to enter (via chair lift if needed)     Home Layout: One level Home Equipment: Walker - 2 wheels;Walker - 4 wheels;Bedside commode;Hand held shower head;Grab bars - toilet;Grab bars - tub/shower      Prior Function Level of Independence: Independent with assistive device(s)         Comments: uses RW for mobility     Hand Dominance   Dominant Hand: Right    Extremity/Trunk Assessment   Upper Extremity Assessment: Defer to OT evaluation           Lower Extremity Assessment: Generalized weakness (limited ROM, BLE Edema and +arthritic changes)         Communication   Communication: HOH  Cognition Arousal/Alertness: Awake/alert Behavior During Therapy: WFL for tasks assessed/performed Overall Cognitive Status: Within Functional Limits for tasks assessed  General Comments      Exercises        Assessment/Plan    PT Assessment Patient needs continued PT services  PT Diagnosis Difficulty  walking;Abnormality of gait;Generalized weakness;Acute pain   PT Problem List Decreased strength;Decreased activity tolerance;Decreased balance;Decreased mobility;Cardiopulmonary status limiting activity;Pain  PT Treatment Interventions DME instruction;Gait training;Stair training;Functional mobility training;Therapeutic activities;Therapeutic exercise;Balance training;Patient/family education   PT Goals (Current goals can be found in the Care Plan section) Acute Rehab PT Goals Patient Stated Goal: to go home PT Goal Formulation: With patient Time For Goal Achievement: 03/27/15 Potential to Achieve Goals: Good    Frequency Min 3X/week   Barriers to discharge        Co-evaluation               End of Session Equipment Utilized During Treatment: Gait belt Activity Tolerance: Patient tolerated treatment well;Patient limited by fatigue Patient left: in bed;with call bell/phone within reach;with bed alarm set Nurse Communication: Mobility status         Time: 1438-1500 PT Time Calculation (min) (ACUTE ONLY): 22 min   Charges:   PT Evaluation $PT Eval Moderate Complexity: 1 Procedure     PT G CodesDuncan Dull 03/26/15, 3:24 PM Alben Deeds, [place] DPT  [phone]

## 2015-03-13 NOTE — Progress Notes (Signed)
Orthopedic Tech Progress Note Patient Details:  Luis Glenn 12/06/1927 [id number]  Ortho Devices Type of Ortho Device: Louretta Parma boot Ortho Device/Splint Location: bilateral Ortho Device/Splint Interventions: Application   Hildred Priest 03/13/2015, 12:51 PM

## 2015-03-13 NOTE — Progress Notes (Signed)
[facility]  Patient Status: Active (receiving services up to time of hospitalization)  [facility] is providing the following services: RN, PT, OT, MSW and HHA  If patient discharges after hours, please call [phone].   Luis Glenn 03/13/2015, 11:27 AM

## 2015-03-14 LAB — CBC
HEMATOCRIT: 32.6 % — AB (ref 39.0–52.0)
HEMOGLOBIN: 10.5 g/dL — AB (ref 13.0–17.0)
MCH: 29.7 pg (ref 26.0–34.0)
MCHC: 32.2 g/dL (ref 30.0–36.0)
MCV: 92.1 fL (ref 78.0–100.0)
Platelets: 180 10*3/uL (ref 150–400)
RBC: 3.54 MIL/uL — AB (ref 4.22–5.81)
RDW: 15.9 % — ABNORMAL HIGH (ref 11.5–15.5)
WBC: 5.6 10*3/uL (ref 4.0–10.5)

## 2015-03-14 LAB — BASIC METABOLIC PANEL
ANION GAP: 12 (ref 5–15)
BUN: 32 mg/dL — ABNORMAL HIGH (ref 6–20)
CHLORIDE: 106 mmol/L (ref 101–111)
CO2: 26 mmol/L (ref 22–32)
CREATININE: 1.59 mg/dL — AB (ref 0.61–1.24)
Calcium: 9 mg/dL (ref 8.9–10.3)
GFR calc non Af Amer: 38 mL/min — ABNORMAL LOW (ref >60)
GFR, EST AFRICAN AMERICAN: 44 mL/min — AB (ref >60)
Glucose, Bld: 102 mg/dL — ABNORMAL HIGH (ref 65–99)
POTASSIUM: 3.9 mmol/L (ref 3.5–5.1)
Sodium: 144 mmol/L (ref 135–145)

## 2015-03-14 NOTE — Clinical Social Work Placement (Signed)
   CLINICAL SOCIAL WORK PLACEMENT  NOTE  Date:  03/14/2015  Patient Details  Name: Luis Glenn MRN: [id number] Date of Birth: 07/27/1927  Clinical Social Work is seeking post-discharge placement for this patient at the [place] level of care (*CSW will initial, date and re-position this form in  chart as items are completed):  Yes   Patient/family provided with [place] Work Department's list of facilities offering this level of care within the geographic area requested by the patient (or if unable, by the patient's family).  Yes   Patient/family informed of their freedom to choose among providers that offer the needed level of care, that participate in Medicare, Medicaid or managed care program needed by the patient, have an available bed and are willing to accept the patient.  Yes   Patient/family informed of [facility]'s ownership interest in [facility] and [facility], as well as of the fact that they are under no obligation to receive care at these facilities.  PASRR submitted to EDS on 03/14/15     PASRR number received on 03/14/15     Existing PASRR number confirmed on       FL2 transmitted to all facilities in geographic area requested by pt/family on 03/14/15     FL2 transmitted to all facilities within larger geographic area on       Patient informed that his/her managed care company has contracts with or will negotiate with certain facilities, including the following:            Patient/family informed of bed offers received.  Patient chooses bed at       Physician recommends and patient chooses bed at      Patient to be transferred to   on  .  Patient to be transferred to facility by Ambulance [facility])     Patient family notified on   of transfer.  Name of family member notified:        PHYSICIAN Please prepare priority discharge summary, including medications, Please sign FL2, Please prepare prescriptions      Additional Comment:    _______________________________________________ Williemae Area, LCSW 03/14/2015,  6:15 PM

## 2015-03-14 NOTE — Clinical Social Work Note (Addendum)
Clinical Social Work Assessment  Patient Details  Name: Luis Glenn MRN: [id number] Date of Birth: Jul 08, 1927  Date of referral:  03/14/15               Reason for consult:   SNF Placement              Permission sought to share information with:  Facility Sport and exercise psychologist, Family Supports Permission granted to share information::  Yes, Verbal Permission Granted         Housing/Transportation Living arrangements for the past 2 months:  [place] of Information:  Patient Patient Interpreter Needed:  None Criminal Activity/Legal Involvement Pertinent to Current Situation/Hospitalization:  No - Comment as needed Significant Relationships:  Adult Children Lives with:  Self Do you feel safe going back to the place where you live?  Yes Need for family participation in patient care:  No (Coment)  Care giving concerns:  "I live alone; I need to get stronger so I can get back to being more independent."  Facilities manager / plan:   CSW received call from patient requesting assistance with short term SNF and was very anxious to talk about this process.  Patient states that he lives alone and he worries about being able to manage at home at this time.  CSW had not received a SNF order as PT's recommendation for Home Health PT- however- patient is being treated for CHF and states he is having trouble managing his symptoms at home at this time. He is requesting placement at [facility] as he is familiar with this facility as his wife was a resident there 3 times prior to her death.  Patient is currently on IV lasix and has lower extremity swelling and weakness.  Fl2 will be initiated and placed on chart for MD's signature.  Discussed with Dr. Hartford Poli who agrees with this disposition.   Employment status:  Retired Health visitor PT Recommendations:  Home with [place] (Patient has other SNF needs) Information / Referral to community resources:   [place]  Patient/Family's Response to care: Patient states that while he is feeling better- he does not feel he is ready to return home alone.  He stated that he has been worried about this since he was admitted to the hospital.  Patient/Family's Understanding of and Emotional Response to Diagnosis, Current Treatment, and Prognosis:  Patient became very emotional after discussion of SNF rehab placement with CSW stating "I've been so worried and upset about this; I wish I had known to ask for you earlier."  Patient expressed feelings of relief and understanding of SNF placement process.  He hopes for [facility] but is understanding of need for full search.   Emotional Assessment Appearance:  Appears stated age Attitude/Demeanor/Rapport:   (Cooperative, positive, friendly, responsive, involved) Affect (typically observed):  Pleasant, Happy, Calm, Accepting Orientation:  Oriented to Self, Oriented to Place, Oriented to  Time, Oriented to Situation Alcohol / Substance use:  Not Applicable Psych involvement (Current and /or in the community):  No (Comment)  Discharge Needs  Concerns to be addressed:  Care Coordination Readmission within the last 30 days:  No Current discharge risk:  Lives alone Barriers to Discharge:  Continued Medical Work up   Kendell Bane T, LCSW 03/14/2015, 6:00 PM

## 2015-03-14 NOTE — NC FL2 (Signed)
[place] LEVEL OF CARE SCREENING TOOL     IDENTIFICATION  Patient Name: Luis Glenn Birthdate: 03/15/1927 Sex: male Admission Date (Current Location): 03/12/2015  [facility] and [place] Number:  Herbalist and Address:  The . [facility], [place] [address] [place], [place] [address]      Provider Number: [id number]  Attending Physician Name and Address:  Verlee Monte, MD  Relative Name and Phone Number:       Current Level of Care: Hospital Recommended Level of Care: [place] Prior Approval Number:    Date Approved/Denied:   PASRR Number: [id number]  Discharge Plan: SNF    Current Diagnoses: Patient Active Problem List   Diagnosis Date Noted  . Chronic kidney disease, stage III (moderate) 03/13/2015  . Cellulitis 03/12/2015  . Cellulitis of left leg 03/12/2015  . Nausea without vomiting 11/12/2014  . Anemia 10/18/2014  . Renal insufficiency 10/17/2014  . Loss of weight 01/24/2014  . Pericardial effusion 10/18/2013  . Primary localized osteoarthrosis, lower leg 09/29/2013  . OSA (obstructive sleep apnea) 02/24/2013  . CHF (congestive heart failure) ([place])   . Atrial fibrillation ([place])   . IBS (irritable bowel syndrome) 06/24/2011  . Depression 03/19/2009  . DIVERTICULOSIS, COLON 03/19/2009  . OSTEOARTHRITIS 03/19/2009  . HYPERLIPIDEMIA 02/11/2009  . ALLERGIC RHINITIS 02/11/2009  . GERD 02/11/2009  . BPH (benign prostatic hyperplasia) 02/11/2009  . SHOULDER PAIN, LEFT, CHRONIC 02/11/2009  . GOUT 02/07/2009  . Essential hypertension 02/07/2009  . ANXIETY 06/13/2004  . PERIPHERAL VASCULAR DISEASE 06/12/2003    Orientation RESPIRATION BLADDER Height & Weight     Time, Self, Situation, Place  Normal Continent Weight: 204 lb 9.6 oz (92.806 kg) (scale a) Height:  6\' 2"  (188 cm)  BEHAVIORAL SYMPTOMS/MOOD NEUROLOGICAL BOWEL NUTRITION STATUS      Continent Diet (Heart Healthy)  AMBULATORY STATUS  COMMUNICATION OF NEEDS Skin   Limited Assist Verbally Other (Comment) (Bilateral cellulitis of legs- [employer] )                       Personal Care Assistance Level of Assistance  Bathing, Dressing Bathing Assistance: Limited assistance   Dressing Assistance: Limited assistance     Functional Limitations Info             SPECIAL CARE FACTORS FREQUENCY  PT (By licensed PT), OT (By licensed OT)     PT Frequency: 5 OT Frequency: 5            Contractures Contractures Info: Not present    Additional Factors Info  Code Status Code Status Info: Full Allergies Info: PCNs           Current Medications (03/14/2015):  This is the current hospital active medication list Current Facility-Administered Medications  Medication Dose Route Frequency Provider Last Rate Last Dose  . acetaminophen (TYLENOL) tablet 500 mg  500 mg Oral TID PC & HS Norval Morton, MD   500 mg at 03/14/15 2127  . albuterol (PROVENTIL) (2.5 MG/3ML) 0.083% nebulizer solution 2.5 mg  2.5 mg Nebulization Q2H PRN Norval Morton, MD      . allopurinol (ZYLOPRIM) tablet 300 mg  300 mg Oral Daily Norval Morton, MD   300 mg at 03/14/15 0959  . ALPRAZolam Duanne Moron) tablet 0.5 mg  0.5 mg Oral TID PRN Norval Morton, MD      . apixaban (ELIQUIS) tablet 2.5 mg  2.5 mg Oral BID Rondell  Charmayne Sheer, MD   2.5 mg at 03/14/15 2127  . fluticasone (FLONASE) 50 MCG/ACT nasal spray 2 spray  2 spray Each Nare Daily Norval Morton, MD   2 spray at 03/14/15 0959  . furosemide (LASIX) injection 40 mg  40 mg Intravenous Q12H Norval Morton, MD   40 mg at 03/14/15 2127  . morphine 2 MG/ML injection 2 mg  2 mg Intravenous Q3H PRN Norval Morton, MD      . ondansetron (ZOFRAN) tablet 4 mg  4 mg Oral Q6H PRN Norval Morton, MD       Or  . ondansetron (ZOFRAN) injection 4 mg  4 mg Intravenous Q6H PRN Norval Morton, MD   4 mg at 03/14/15 1405  . oxyCODONE (Oxy IR/ROXICODONE) immediate release tablet 5 mg  5 mg Oral BID PRN  Norval Morton, MD      . simvastatin (ZOCOR) tablet 40 mg  40 mg Oral q1800 Norval Morton, MD   40 mg at 03/14/15 1813  . sodium chloride flush (NS) 0.9 % injection 3 mL  3 mL Intravenous Q12H Norval Morton, MD   3 mL at 03/14/15 2127  . spironolactone (ALDACTONE) tablet 25 mg  25 mg Oral Daily Norval Morton, MD   25 mg at 03/14/15 0959  . tamsulosin (FLOMAX) capsule 0.4 mg  0.4 mg Oral Daily Norval Morton, MD   0.4 mg at 03/14/15 0959  . vancomycin (VANCOCIN) IVPB 1000 mg/200 mL premix  1,000 mg Intravenous Q24H Rebecka Apley, RPH 200 mL/hr at 03/14/15 2208 1,000 mg at 03/14/15 2208     Discharge Medications: Please see discharge summary for a list of discharge medications.  Relevant Imaging Results:  Relevant Lab Results:   Additional Information SSN:  [id number]   Short term SNF, then return home  Pauline Good, Lorie Phenix, [place]

## 2015-03-14 NOTE — Progress Notes (Signed)
[facility] PROGRESS NOTE   Luis Glenn [id number] DOB: 04/02/1927 DOA: 03/12/2015 PCP: Binnie Rail, MD  HPI/Subjective: Complains about her lack of appetite, denies any other complaints.  Assessment/Plan: Principal Problem:   CHF (congestive heart failure) (HCC) Active Problems:   Essential hypertension   BPH (benign prostatic hyperplasia)   Atrial fibrillation (HCC)   Anemia   Cellulitis of left leg   Chronic kidney disease, stage III (moderate)   Acute on chronic diastolic congestive heart failure:  -Patient reports increasing lower extremity edema. BNP elevated at 1212. Last 2-D echo showed LVEF of 60-65%. - Strict ins and outs - Continue spironolactone - Lasix 40 mg IV every 12 hours, following take/output and renal function closely. Elevate lower extremities. - Repeat echocardiogram.   Cellulitis: Acute,?.  -Patient with redness of the anterior shin of the left leg. Patient appears to be afebrile white blood cell count 4.9. - The redness appears to be hemosiderin discoloration from chronic congestion. -Cannot rule out cellulitis, will do short course of antibiotics. Ace wraps as well.  Atrial fibrillation on Eliquis: Appears rate controlled CHA2DS2-VASc score 4 - Continue Eliquis, no evidence of bleeding.  Pericardial effusion -Last echo 06/2014 which was noted to be a large effusion without hemodynamic effects. Patient apparently declined pericardial window at that time. -2-D echo to be repeated, x-ray showed large pericardial effusion  Chronic kidney disease stage III: Patient's baseline creatinine has been anywhere from 1.58-1.85 over the last year. On presentation patient's creatinine is elevated at 1.8 - Continue to monitor  Essential Hypertension - Continue terazosin  Hyperkalemia: Potassium elevated at 5.2 on admission, no EKG changes observed, and patient was given 40 mg of IV Lasix. -Potassium is 4.6 today.  Anemia: hemoglobin is  11.5 -This is likely anemia of chronic kidney disease.  Hyperlipidemia - continue simvastatin  BPH -Continue Flomax   Code Status: Full Code Family Communication: Plan discussed with the patient. Disposition Plan: Remains inpatient Diet: Diet Heart Room service appropriate?: Yes; Fluid consistency:: Thin  Consultants:  None  Procedures:  None  Antibiotics:  Clindamycin (indicate start date, and stop date if known)   Objective: Filed Vitals:   03/14/15 0444 03/14/15 1212  BP: 146/45 123/59  Pulse: 61 79  Temp: 97.9 F (36.6 C) 98.4 F (36.9 C)  Resp: 18 18    Intake/Output Summary (Last 24 hours) at 03/14/15 1226 Last data filed at 03/14/15 1201  Gross per 24 hour  Intake    800 ml  Output   2875 ml  Net  -2075 ml   Filed Weights   03/13/15 0010 03/14/15 0444  Weight: 93.849 kg (206 lb 14.4 oz) 92.806 kg (204 lb 9.6 oz)    Exam: General: Alert and awake, oriented x3, not in any acute distress. HEENT: anicteric sclera, pupils reactive to light and accommodation, EOMI CVS: S1-S2 clear, no murmur rubs or gallops Chest: clear to auscultation bilaterally, no wheezing, rales or rhonchi Abdomen: soft nontender, nondistended, normal bowel sounds, no organomegaly Extremities: no cyanosis, clubbing or edema noted bilaterally Neuro: Cranial nerves II-XII intact, no focal neurological deficits  Data Reviewed: Basic Metabolic Panel:  Recent Labs Lab 03/11/15 1725 03/12/15 1934 03/13/15 0420 03/14/15 0440  NA 141 145 143 144  K 5.3* 5.2* 4.6 3.9  CL 110 111 111 106  CO2 23 23 23 26   GLUCOSE 109* 102* 113* 102*  BUN 42* 38* 37* 32*  CREATININE 1.89* 1.80* 1.75* 1.59*  CALCIUM 8.8 9.6 8.9 9.0  Liver Function Tests:  Recent Labs Lab 03/11/15 1725  AST 16  ALT 9  ALKPHOS 107  BILITOT 0.6  PROT 6.0  ALBUMIN 3.4*   No results for input(s): LIPASE, AMYLASE in the last 168 hours. No results for input(s): AMMONIA in the last 168  hours. CBC:  Recent Labs Lab 03/12/15 1934 03/13/15 0420 03/14/15 0440  WBC 4.9 4.9 5.6  NEUTROABS 3.8  --   --   HGB 11.5* 10.0* 10.5*  HCT 36.7* 32.3* 32.6*  MCV 92.7 92.6 92.1  PLT 184 179 180   Cardiac Enzymes:  Recent Labs Lab 03/13/15 0045 03/13/15 0545 03/13/15 1147  TROPONINI <0.03 <0.03 <0.03   BNP (last 3 results)  Recent Labs  03/12/15 1934  BNP 1212.7*    ProBNP (last 3 results)  Recent Labs  03/11/15 1725  PROBNP 839.0*    CBG: No results for input(s): GLUCAP in the last 168 hours.  Micro Recent Results (from the past 240 hour(s))  Culture, blood (Routine X 2) w Reflex to ID Panel     Status: None (Preliminary result)   Collection Time: 03/12/15  7:34 PM  Result Value Ref Range Status   Specimen Description BLOOD RIGHT ARM  Final   Special Requests BOTTLES DRAWN AEROBIC AND ANAEROBIC 5CC  Final   Culture NO GROWTH < 24 HOURS  Final   Report Status PENDING  Incomplete  Culture, blood (Routine X 2) w Reflex to ID Panel     Status: None (Preliminary result)   Collection Time: 03/12/15  7:34 PM  Result Value Ref Range Status   Specimen Description BLOOD LEFT ARM  Final   Special Requests BOTTLES DRAWN AEROBIC AND ANAEROBIC 5CC  Final   Culture NO GROWTH < 24 HOURS  Final   Report Status PENDING  Incomplete     Studies: Dg Chest Port 1 View  03/12/2015  CLINICAL DATA:  Shortness of breath and known pericardial effusion. EXAM: PORTABLE CHEST 1 VIEW COMPARISON:  CT 07/11/2014 FINDINGS: Single view of the chest again demonstrates enlargement of the cardiac silhouette. Findings are compatible with a large amount of pericardial fluid. In addition, there are prominent central vascular structures and concern for some pulmonary edema. Limited evaluation of the left lung base due to the enlarged cardiac silhouette. IMPRESSION: Enlarged cardiac silhouette related to a large pericardial effusion. Evidence for vascular congestion or mild pulmonary edema.  Electronically Signed   By: Markus Daft M.D.   On: 03/12/2015 19:06    Scheduled Meds: . acetaminophen  500 mg Oral TID PC & HS  . allopurinol  300 mg Oral Daily  . apixaban  2.5 mg Oral BID  . fluticasone  2 spray Each Nare Daily  . furosemide  40 mg Intravenous Q12H  . simvastatin  40 mg Oral q1800  . sodium chloride flush  3 mL Intravenous Q12H  . spironolactone  25 mg Oral Daily  . tamsulosin  0.4 mg Oral Daily  . vancomycin  1,000 mg Intravenous Q24H   Continuous Infusions:      Time spent: 35 minutes    [facility] A  [facility] Pager [phone] If 7PM-7AM, please contact night-coverage at [email or web address], password [facility] 03/14/2015, 12:26 PM  LOS: 2 days

## 2015-03-15 ENCOUNTER — Inpatient Hospital Stay (HOSPITAL_COMMUNITY): Payer: Medicare Other

## 2015-03-15 ENCOUNTER — Ambulatory Visit: Payer: Medicare Other | Admitting: Internal Medicine

## 2015-03-15 DIAGNOSIS — I5032 Chronic diastolic (congestive) heart failure: Secondary | ICD-10-CM

## 2015-03-15 DIAGNOSIS — I319 Disease of pericardium, unspecified: Secondary | ICD-10-CM

## 2015-03-15 DIAGNOSIS — I509 Heart failure, unspecified: Secondary | ICD-10-CM

## 2015-03-15 DIAGNOSIS — N183 Chronic kidney disease, stage 3 (moderate): Secondary | ICD-10-CM

## 2015-03-15 DIAGNOSIS — R609 Edema, unspecified: Secondary | ICD-10-CM

## 2015-03-15 DIAGNOSIS — I10 Essential (primary) hypertension: Secondary | ICD-10-CM

## 2015-03-15 DIAGNOSIS — I482 Chronic atrial fibrillation: Secondary | ICD-10-CM

## 2015-03-15 LAB — BASIC METABOLIC PANEL
Anion gap: 10 (ref 5–15)
BUN: 29 mg/dL — AB (ref 6–20)
CALCIUM: 8.8 mg/dL — AB (ref 8.9–10.3)
CHLORIDE: 107 mmol/L (ref 101–111)
CO2: 27 mmol/L (ref 22–32)
CREATININE: 1.55 mg/dL — AB (ref 0.61–1.24)
GFR calc Af Amer: 45 mL/min — ABNORMAL LOW (ref >60)
GFR calc non Af Amer: 39 mL/min — ABNORMAL LOW (ref >60)
Glucose, Bld: 91 mg/dL (ref 65–99)
Potassium: 3.7 mmol/L (ref 3.5–5.1)
SODIUM: 144 mmol/L (ref 135–145)

## 2015-03-15 LAB — BRAIN NATRIURETIC PEPTIDE: B NATRIURETIC PEPTIDE 5: 1484.5 pg/mL — AB (ref 0.0–100.0)

## 2015-03-15 MED ORDER — POTASSIUM CHLORIDE CRYS ER 20 MEQ PO TBCR
40.0000 meq | EXTENDED_RELEASE_TABLET | Freq: Once | ORAL | Status: AC
  Administered 2015-03-15: 40 meq via ORAL
  Filled 2015-03-15: qty 2

## 2015-03-15 MED ORDER — MAGNESIUM SULFATE 2 GM/50ML IV SOLN
2.0000 g | Freq: Once | INTRAVENOUS | Status: AC
  Administered 2015-03-15: 2 g via INTRAVENOUS
  Filled 2015-03-15: qty 50

## 2015-03-15 MED ORDER — SPIRONOLACTONE 25 MG PO TABS
25.0000 mg | ORAL_TABLET | Freq: Two times a day (BID) | ORAL | Status: DC
  Administered 2015-03-15 – 2015-03-22 (×14): 25 mg via ORAL
  Filled 2015-03-15 (×15): qty 1

## 2015-03-15 NOTE — Progress Notes (Signed)
[facility] PROGRESS NOTE   Luis Glenn [id number] DOB: 11-23-1927 DOA: 03/12/2015 PCP: Binnie Rail, MD  HPI/Subjective: 2-D echo done and showed enlargement of the pericardial effusion worse than the echo and May 2016.  Assessment/Plan: Principal Problem:   CHF (congestive heart failure) (HCC) Active Problems:   Essential hypertension   BPH (benign prostatic hyperplasia)   Atrial fibrillation (HCC)   Anemia   Cellulitis of left leg   Chronic kidney disease, stage III (moderate)   Acute on chronic diastolic congestive heart failure:  -Patient reports increasing lower extremity edema. BNP elevated at 1212. Last 2-D echo showed LVEF of 60-65%. - Strict ins and outs - Continue spironolactone - Lasix 40 mg IV every 12 hours, following take/output and renal function closely. Elevate lower extremities. - Repeat echocardiogram.  Pericardial effusion -Last echo 06/2014 which was noted to be a large effusion without hemodynamic effects. Patient apparently declined pericardial window at that time. -2-D echo repeated and showed enlargement of pericardial effusion, cardiology to evaluate. -Discussed with Dr. Acie Fredrickson, definitely the effusion and is enlarging and needs more evaluation and probable intervention.   Cellulitis: Acute,?.  -Patient with redness of the anterior shin of the left leg. Patient appears to be afebrile white blood cell count 4.9. - The redness appears to be hemosiderin discoloration from chronic congestion. -Cannot rule out cellulitis, will do short course of antibiotics. Ace wraps as well.  Atrial fibrillation on Eliquis: Appears rate controlled CHA2DS2-VASc score 4 - Continue Eliquis, no evidence of bleeding.  Chronic kidney disease stage III: Patient's baseline creatinine has been anywhere from 1.58-1.85 over the last year. On presentation patient's creatinine is elevated at 1.8 - Continue to monitor  Essential Hypertension - Continue  terazosin  Hyperkalemia: Potassium elevated at 5.2 on admission, no EKG changes observed, and patient was given 40 mg of IV Lasix. -Potassium is 4.6 today.  Anemia: hemoglobin is 11.5 -This is likely anemia of chronic kidney disease.  Hyperlipidemia - continue simvastatin  BPH -Continue Flomax   Code Status: Full Code Family Communication: Plan discussed with the patient. Disposition Plan: Remains inpatient Diet: Diet Heart Room service appropriate?: Yes; Fluid consistency:: Thin  Consultants:  None  Procedures:  None  Antibiotics:  Clindamycin (indicate start date, and stop date if known)   Objective: Filed Vitals:   03/15/15 0454 03/15/15 0809  BP: 144/62 140/73  Pulse: 74 77  Temp: 98.7 F (37.1 C) 98.8 F (37.1 C)  Resp: 18 20    Intake/Output Summary (Last 24 hours) at 03/15/15 1119 Last data filed at 03/15/15 1041  Gross per 24 hour  Intake    323 ml  Output   3025 ml  Net  -2702 ml   Filed Weights   03/13/15 0010 03/14/15 0444 03/15/15 0454  Weight: 93.849 kg (206 lb 14.4 oz) 92.806 kg (204 lb 9.6 oz) 90.447 kg (199 lb 6.4 oz)    Exam: General: Alert and awake, oriented x3, not in any acute distress. HEENT: anicteric sclera, pupils reactive to light and accommodation, EOMI CVS: S1-S2 clear, no murmur rubs or gallops Chest: clear to auscultation bilaterally, no wheezing, rales or rhonchi Abdomen: soft nontender, nondistended, normal bowel sounds, no organomegaly Extremities: no cyanosis, clubbing or edema noted bilaterally Neuro: Cranial nerves II-XII intact, no focal neurological deficits  Data Reviewed: Basic Metabolic Panel:  Recent Labs Lab 03/11/15 1725 03/12/15 1934 03/13/15 0420 03/14/15 0440 03/15/15 0400  NA 141 145 143 144 144  K 5.3* 5.2* 4.6 3.9 3.7  CL 110 111 111 106 107  CO2 23 23 23 26 27   GLUCOSE 109* 102* 113* 102* 91  BUN 42* 38* 37* 32* 29*  CREATININE 1.89* 1.80* 1.75* 1.59* 1.55*  CALCIUM 8.8 9.6 8.9 9.0  8.8*   Liver Function Tests:  Recent Labs Lab 03/11/15 1725  AST 16  ALT 9  ALKPHOS 107  BILITOT 0.6  PROT 6.0  ALBUMIN 3.4*   No results for input(s): LIPASE, AMYLASE in the last 168 hours. No results for input(s): AMMONIA in the last 168 hours. CBC:  Recent Labs Lab 03/12/15 1934 03/13/15 0420 03/14/15 0440  WBC 4.9 4.9 5.6  NEUTROABS 3.8  --   --   HGB 11.5* 10.0* 10.5*  HCT 36.7* 32.3* 32.6*  MCV 92.7 92.6 92.1  PLT 184 179 180   Cardiac Enzymes:  Recent Labs Lab 03/13/15 0045 03/13/15 0545 03/13/15 1147  TROPONINI <0.03 <0.03 <0.03   BNP (last 3 results)  Recent Labs  03/12/15 1934  BNP 1212.7*    ProBNP (last 3 results)  Recent Labs  03/11/15 1725  PROBNP 839.0*    CBG: No results for input(s): GLUCAP in the last 168 hours.  Micro Recent Results (from the past 240 hour(s))  Culture, blood (Routine X 2) w Reflex to ID Panel     Status: None (Preliminary result)   Collection Time: 03/12/15  7:34 PM  Result Value Ref Range Status   Specimen Description BLOOD RIGHT ARM  Final   Special Requests BOTTLES DRAWN AEROBIC AND ANAEROBIC 5CC  Final   Culture NO GROWTH 2 DAYS  Final   Report Status PENDING  Incomplete  Culture, blood (Routine X 2) w Reflex to ID Panel     Status: None (Preliminary result)   Collection Time: 03/12/15  7:34 PM  Result Value Ref Range Status   Specimen Description BLOOD LEFT ARM  Final   Special Requests BOTTLES DRAWN AEROBIC AND ANAEROBIC 5CC  Final   Culture NO GROWTH 2 DAYS  Final   Report Status PENDING  Incomplete     Studies: No results found.  Scheduled Meds: . acetaminophen  500 mg Oral TID PC & HS  . allopurinol  300 mg Oral Daily  . apixaban  2.5 mg Oral BID  . fluticasone  2 spray Each Nare Daily  . furosemide  40 mg Intravenous Q12H  . simvastatin  40 mg Oral q1800  . sodium chloride flush  3 mL Intravenous Q12H  . spironolactone  25 mg Oral Daily  . tamsulosin  0.4 mg Oral Daily  . vancomycin   1,000 mg Intravenous Q24H   Continuous Infusions:      Time spent: 35 minutes    [facility] A  [facility] Pager [phone] If 7PM-7AM, please contact night-coverage at [email or web address], password [facility] 03/15/2015, 11:19 AM  LOS: 3 days

## 2015-03-15 NOTE — Progress Notes (Signed)
Pt chooses [facility] for rehab- Miquel Dunn to complete paperwork with pt this afternoon and anticipate possible Saturday DC  CSW will continue to follow  Domenica Reamer, [place] Social Worker [phone]

## 2015-03-15 NOTE — Care Management Important Message (Signed)
Important Message  Patient Details  Name: Luis Glenn MRN: [id number] Date of Birth: 1927/12/02   Medicare Important Message Given:  Yes    [name] 03/15/2015, 4:05 PM

## 2015-03-15 NOTE — Consult Note (Addendum)
CARDIOLOGY CONSULT NOTE   Patient ID: Luis Glenn MRN: [id number], DOB/AGE: [birth year]   Admit date: 03/12/2015 Date of Consult: 03/15/2015   Primary Physician: Binnie Rail, MD Primary Cardiologist: Dr. Acie Fredrickson  Pt. Profile  Luis Glenn is a 82 yo male with PMH of moderately large pericardial effusion, gout, HTN, HLD, DM II and h/o afib on eliquis presented with worsening LE edema and L leg wound and noted to have enlarging pericardial effusion.   Problem List  Past Medical History  Diagnosis Date  . GOUT   . PERIPHERAL VASCULAR DISEASE   . DIVERTICULOSIS, COLON   . BENIGN PROSTATIC HYPERTROPHY   . DEPRESSION   . DIABETES MELLITUS, TYPE II   . GERD   . HYPERLIPIDEMIA   . HYPERTENSION   . CATARACTS   . OSTEOARTHRITIS   . ANXIETY   . ALLERGIC RHINITIS     Past Surgical History  Procedure Laterality Date  . Tonsillectomy  10/1942     Allergies  Allergies  Allergen Reactions  . Penicillins Swelling and Rash    Has patient had a PCN reaction causing immediate rash, facial/tongue/throat swelling, SOB or lightheadedness with hypotension: NO Has patient had a PCN reaction causing severe rash involving mucus membranes or skin necrosis:NO Has patient had a PCN reaction that required hospitalization No Has patient had a PCN reaction occurring within the last 10 years: NO If all of the above answers are "NO", then may proceed with Cephalosporin use.    HPI   Luis Glenn is a 82 yo male with PMH of moderately large pericardial effusion, gout, HTN, HLD, DM II and h/o afib on eliquis. He has been establish with Dr. Acie Fredrickson since 2015 for moderate large pericardial effusion.  he was previously treated with colchicine and ibuprofen, interestingly, he is pericardial effusion did not be decreasing in size despite taking colchicine and ibuprofen. He was later taken off colchicine. He denies any history of cancer.   : Insufficient, he has been compliant with his medical therapy at home  including Lasix. However despite taking his usual dose of Lasix, he has been noticing increasing lower extremity edema and shortness breath. Although he does deny history of orthopnea PND, however he also states at the same time sometimes he gets for breath especially when he tries to lay down at night, sometimes symptoms to resolve after he takes a few deep breath and to try to relax. It does since consistent with orthopnea despite him denying it. He also noticed a scrotal edema and abdominal distention. Recently, once the blister in his left leg has been popped, he has been noticing some redness around it. He eventually sought medical attention at [facility] on 03/12/2015 for increasing lower extremity edema and left leg wound.   So far she has been aggressively diuresed with IV Lasix, he has put out > 5 L of urine. His weight has decreased from 206 pounds down to 199 pounds. Echocardiogram was obtained on 03/15/2015 which showed an EF 55-60%, no regional wall motion abnormality, mild AR, severely dilated left atrium, moderately reduced LVEF, PA peak pressure 50 mmHg, large free-flowing pericardial effusion identified circumferential of the heart. Compared to the previous echo, the pericardial effusion has enlarged in size. Aorta has been causative enlarging pericardial effusion. Interestingly, patient does not appears to have any symptom nor has there been any sign of hemodynamic compromise from the large pericardial fluid.  Inpatient Medications  . acetaminophen  500 mg Oral TID PC &  HS  . allopurinol  300 mg Oral Daily  . apixaban  2.5 mg Oral BID  . fluticasone  2 spray Each Nare Daily  . furosemide  40 mg Intravenous Q12H  . simvastatin  40 mg Oral q1800  . sodium chloride flush  3 mL Intravenous Q12H  . spironolactone  25 mg Oral Daily  . tamsulosin  0.4 mg Oral Daily  . vancomycin  1,000 mg Intravenous Q24H    Family History Family History  Problem Relation Age of Onset  .  Diabetes Mother   . Hypertension Mother   . Diabetes Father   . Hypertension Father   . Alcohol abuse Other   . Breast cancer Other      Social History Social History   Social History  . Marital Status: Widowed    Spouse Name: N/A  . Number of Children: N/A  . Years of Education: N/A   Occupational History  . Retired    Social History Main Topics  . Smoking status: Former Smoker -- 3.00 packs/day for 40 years    Types: Cigarettes    Quit date: 02/09/1989  . Smokeless tobacco: Not on file  . Alcohol Use: Yes     Comment: occassional  . Drug Use: No  . Sexual Activity: Not on file   Other Topics Concern  . Not on file   Social History Narrative   Widowed - wife [name] passed 07/2013     Review of Systems  General:  No chills, fever, night sweats or weight changes.  Cardiovascular:  No chest pain, dyspnea on exertion, palpitations, paroxysmal nocturnal dyspnea. +edema, orthopnea Dermatological: No rash, lesions/masses Respiratory: No cough, dyspnea Urologic: No hematuria, dysuria Abdominal:   No nausea, vomiting, diarrhea, bright red blood per rectum, melena, or hematemesis Neurologic:  No visual changes, wkns, changes in mental status. All other systems reviewed and are otherwise negative except as noted above.  Physical Exam  Blood pressure 129/65, pulse 84, temperature 97.9 F (36.6 C), temperature source Oral, resp. rate 18, height 6\' 2"  (1.88 m), weight 199 lb 6.4 oz (90.447 kg), SpO2 92 %.  General: Pleasant, NAD Psych: Normal affect. Neuro: Alert and oriented X 3. Moves all extremities spontaneously. HEENT: Normal  Neck: Supple without bruits. Lungs:  Resp regular and unlabored. Bilateral bases rale Heart: irregular. no s3, s4, or murmurs. Abdomen: Soft, non-tender, non-distended, BS + x 4.  Extremities: No clubbing, cyanosis. DP/PT/Radials 2+ and equal bilaterally. 2+ pitting edema in bilateral LE, scrotal edema, unable to assess LLE wound as LE covered  in elastic dressing  Labs   Recent Labs  03/13/15 0045 03/13/15 0545 03/13/15 1147  TROPONINI <0.03 <0.03 <0.03   Lab Results  Component Value Date   WBC 5.6 03/14/2015   HGB 10.5* 03/14/2015   HCT 32.6* 03/14/2015   MCV 92.1 03/14/2015   PLT 180 03/14/2015    Recent Labs Lab 03/11/15 1725  03/15/15 0400  NA 141  < > 144  K 5.3*  < > 3.7  CL 110  < > 107  CO2 23  < > 27  BUN 42*  < > 29*  CREATININE 1.89*  < > 1.55*  CALCIUM 8.8  < > 8.8*  PROT 6.0  --   --   BILITOT 0.6  --   --   ALKPHOS 107  --   --   ALT 9  --   --   AST 16  --   --   GLUCOSE  109*  < > 91  < > = values in this interval not displayed. Lab Results  Component Value Date   CHOL 116 09/18/2013   HDL 61.50 09/18/2013   LDLCALC 46 09/18/2013   TRIG 41.0 09/18/2013   No results found for: DDIMER  Radiology/Studies  Dg Chest Port 1 View  03/12/2015  CLINICAL DATA:  Shortness of breath and known pericardial effusion. EXAM: PORTABLE CHEST 1 VIEW COMPARISON:  CT 07/11/2014 FINDINGS: Single view of the chest again demonstrates enlargement of the cardiac silhouette. Findings are compatible with a large amount of pericardial fluid. In addition, there are prominent central vascular structures and concern for some pulmonary edema. Limited evaluation of the left lung base due to the enlarged cardiac silhouette. IMPRESSION: Enlarged cardiac silhouette related to a large pericardial effusion. Evidence for vascular congestion or mild pulmonary edema. Electronically Signed   By: Markus Daft M.D.   On: 03/12/2015 19:06    ECG  A-fib, rate controlled  ASSESSMENT AND PLAN  1. Enlarging pericardial effusion  - chronic moderate large pericardial effusion not respond to ibuprofen and colchicine treatment since 2015, likely cause of enlargement is acute on chronic diastolic HF  - discussed pericardiocentesis and pericardial window, he is very hesitant to any invasive procedure given his age. Also likelihood of  recurrence with pericardiocentesis is high. I think his condition maybe respond to further diuresis. He is still very much fluid overloaded on exam   - he is responding to current dose of IV lasix, will continue.   2. h/o chronic afib on eliquis  - CHA2DS2-Vasc score 5 (age, HTN, DM, HF)  - This patients CHA2DS2-VASc Score and unadjusted Ischemic Stroke Rate (% per year) is equal to 7.2 % stroke rate/year from a score of 5  3. HTN 4. HLD 5. DM II   Signed, Almyra Deforest, PA-C 03/15/2015, 2:29 PM  Patient seen and examined. Agree with assessment and plan.  Luis Glenn is an 82 year old patient of Dr. Mertie Moores who has a history of a chronic moderate to large pericardial effusion, as well as a history of gout, hypertension, type 2 diabetes mellitus, hyperlipidemia, and atrial fibrillation.  He has been on eliquis for anticoagulation.  [place], he had been on colchicine and ibuprofen without significant benefit.  He is also felt to have moderate ulnar hypertension and aortic insufficiency.  He was admitted on 03/12/2015 .  Increasing chronic lower extremity edema.  Initial BMP was 1212.7.  He has diuresed 5.9 L since admission.  A repeat echo Doppler study has shown an ejection fraction of 55-60% without regional wall motion abnormalities.  There was evidence for mild aortic insufficiency, mild to moderate mitral annular calcification, significant biatrial enlargement, and moderate reduced RV systolic function.  There was evidence for moderate pulmonary hypertension with a PA pressure at 50 mg liters.  There was a large free-flowing pericardial effusion circumferentially without cardiac tamponade hemodynamics.  When compared to his prior echo in May 2016, the effusion had increased.  His physical exam is notable for normal blood pressure without paradoxic disc.  There was evidence for hepatojugular reflux without carotid bruits.  He had decreased breath sounds to his lungs bilaterally with basilar rales.   His rhythm was irregularly irregular with a controlled ventricular response in the 80s.  His abdomen was distended with positive bowel sounds and nontender.  It was significant scrotal edema.  He had 2+ pitting edema bilaterally, but his legs were bandaged.  Neurologic exam was grossly nonfocal.  ECG shows atrial fibrillation at 82 bpm.  There is low voltage and poor progression with transient ventricular bigeminy.  Average for his notable for potassium of 3.7.  Creatinine 1.55 with a GFR of 39, consistent with stage III chronic kidney disease.  His BNP was elevated at 1212.7.  He was mildly anemic with hemoglobin of 10.5, hematocrit of 32.6.  Platelets were normal at 180.  TSH 1.57.  Chest size x-ray suggested a large cardiac silhouette secondary to his pericardial effusion and evidence for vascular congestion suggestive of mild pulmonary edema.  I reviewed the echocardiographic data.  The patient's chronic pericardial effusion seems to be increasing in size. I discussed potential surgical consultation for consideration of a pericardial window procedure.  In the past, the patient was against any  pericardiocentesis due to his concern for liklihood of  reaccumulation.  At present, would recommend continue diuresis and I will increase his spironolactone to 25 mg twice a day.  The patient is open for surgical consultation and this can be done this weekend.  If there is consideration for pericardial window the patient's eliquis will need to be held for at least 48 hours prior to any procedure. Troy Sine, MD, [facility] 03/15/2015 5:32 PM

## 2015-03-15 NOTE — Progress Notes (Signed)
Pharmacy Antibiotic Note  Luis Glenn is a 82 y.o. male admitted on 03/12/2015 with lower leg swelling.  On day # 4 Vancomycin for cellulitis coverage.   Plan:  Continue Vancomycin 1 gram IV q24hrs.  Afebrile, WBC 5.6, creatinine back to baseline, Vanc dose still okay.  Noted short antibiotic course planned, so will defer vanc trough for now.  target vanc troughs 10-15 mcg/ml  Height: 6\' 2"  (188 cm) Weight: 199 lb 6.4 oz (90.447 kg) (a scale) IBW/kg (Calculated) : 82.2  Temp (24hrs), Avg:98.4 F (36.9 C), Min:97.9 F (36.6 C), Max:98.8 F (37.1 C)   Recent Labs Lab 03/11/15 1725 03/12/15 1934 03/13/15 0420 03/14/15 0440 03/15/15 0400  WBC  --  4.9 4.9 5.6  --   CREATININE 1.89* 1.80* 1.75* 1.59* 1.55*    Estimated Creatinine Clearance: 40.5 mL/min (by C-G formula based on Cr of 1.55).    Allergies  Allergen Reactions  . Penicillins Swelling and Rash    Has patient had a PCN reaction causing immediate rash, facial/tongue/throat swelling, SOB or lightheadedness with hypotension: NO Has patient had a PCN reaction causing severe rash involving mucus membranes or skin necrosis:NO Has patient had a PCN reaction that required hospitalization No Has patient had a PCN reaction occurring within the last 10 years: NO If all of the above answers are "NO", then may proceed with Cephalosporin use.    Antimicrobials this admission:   Vancomycin 1/31>>  Dose adjustments this admission:   none  Microbiology results:  1/31 blood x 2 - no growth x 2 days so far   Arty Baumgartner, [place] Pager: [phone] 03/15/2015 12:59 PM

## 2015-03-15 NOTE — Progress Notes (Signed)
  Echocardiogram 2D Echocardiogram has been performed.  Luis Glenn 03/15/2015, 12:08 PM

## 2015-03-15 NOTE — Research (Signed)
REDS_0  Informed Consent   Subject Name: Luis Glenn  Subject met inclusion and exclusion criteria.  The informed consent form, study requirements and expectations were reviewed with the subject and questions and concerns were addressed prior to the signing of the consent form.  The subject verbalized understanding of the trail requirements.  The subject agreed to participate in the REDS_1  trial and signed the informed consent.  The informed consent was obtained prior to performance of any protocol-specific procedures for the subject.  A copy of the signed informed consent was given to the subject and a copy was placed in the subject's medical record.  Sandie Ano 03/15/2015, 11:45

## 2015-03-15 NOTE — Progress Notes (Signed)
Physical Therapy Treatment Patient Details Name: Luis Glenn MRN: [id number] DOB: 1927/08/19 Today's Date: 03/15/2015    History of Present Illness 82 year old male with a past medical history significant for HTN, PVD, HLD, diabetes mellitus type 2, depression, BPH, pericardial effusion last echo in 06/2013; who presents with complaints of leg swelling in setting of CHF exacerbation and LE cellulitis.    PT Comments    Pt admitted with above diagnosis. Pt currently with functional limitations due to the deficits listed below (see PT Problem List). Pt able to ambulate 260 feet with RW and supervision. He had trouble with sit/stand from low surfaces, and requires verbal cues for feet/hand placement for safety. Pt will continue to benefit from skilled PT to increase their independence and safety with mobility to allow discharge to SNF. Pt lives alone and does not have 24 hour assistance and would benefit from SNF rehab to continue to strengthen and become more independent to decrease fall risk before returning home.    Follow Up Recommendations  SNF (Pt does not have 24 hour supervision, would benefit from SNF)     Equipment Recommendations  None recommended by PT    Recommendations for Other Services       Precautions / Restrictions Precautions Precautions: Fall Restrictions Weight Bearing Restrictions: No    Mobility  Bed Mobility Overal bed mobility: Needs Assistance Bed Mobility: Supine to Sit     Supine to sit: Supervision     General bed mobility comments: Supervision with HOB elevated  Transfers Overall transfer level: Needs assistance Equipment used: Rolling walker (2 wheeled) Transfers: Sit to/from Stand Sit to Stand: Min guard         General transfer comment: Pt needs verbal cues for foot and hand placement. takes increased time and prefers to stand fom elevated surface.    Ambulation/Gait Ambulation/Gait assistance: Supervision Ambulation Distance  (Feet): 260 Feet Assistive device: Rolling walker (2 wheeled) Gait Pattern/deviations: Decreased stride length;Step-through pattern;Trunk flexed;Wide base of support Gait velocity: mildly decreased       Stairs            Wheelchair Mobility    Modified Rankin (Stroke Patients Only)       Balance Overall balance assessment: Needs assistance Sitting-balance support: Feet supported;No upper extremity supported Sitting balance-Leahy Scale: Good     Standing balance support: Bilateral upper extremity supported Standing balance-Leahy Scale: Fair                      Cognition Arousal/Alertness: Awake/alert Behavior During Therapy: WFL for tasks assessed/performed Overall Cognitive Status: Within Functional Limits for tasks assessed                      Exercises Other Exercises Other Exercises: Sit to stand (Sit to stand x5 starting w/ elevated surface, titrating down) Started out with higher surface, then moved it down slowly each sit/stand until the bed was as low as it would go utilizing a partial task technique. Educated pt on the importance of proper hand/foot placement and rocking for momentum for safety as well as mechanical advantage. Pt responded well to instruction and verbal cues and was able to stand from a low surface with the 5th sit/stand.    General Comments        Pertinent Vitals/Pain Pain Assessment: No/denies pain    Home Living  Prior Function            PT Goals (current goals can now be found in the care plan section) Acute Rehab PT Goals Patient Stated Goal: To go to rehab to get my strength up before I go home.  PT Goal Formulation: With patient Time For Goal Achievement: 03/29/15 Potential to Achieve Goals: Good Progress towards PT goals: Progressing toward goals    Frequency  Min 3X/week    PT Plan Current plan remains appropriate    Co-evaluation             End of Session  Equipment Utilized During Treatment: Gait belt Activity Tolerance: Patient tolerated treatment well Patient left: in chair;with bed alarm set     Time: [id number] PT Time Calculation (min) (ACUTE ONLY): 18 min  Charges:  $Gait Training: 8-22 mins                    G Codes:      Colon Branch, SPT Colon Branch 03/15/2015, 1:32 PM

## 2015-03-16 DIAGNOSIS — I313 Pericardial effusion (noninflammatory): Secondary | ICD-10-CM

## 2015-03-16 DIAGNOSIS — I5033 Acute on chronic diastolic (congestive) heart failure: Secondary | ICD-10-CM

## 2015-03-16 DIAGNOSIS — Z7901 Long term (current) use of anticoagulants: Secondary | ICD-10-CM

## 2015-03-16 LAB — BASIC METABOLIC PANEL
ANION GAP: 11 (ref 5–15)
BUN: 27 mg/dL — ABNORMAL HIGH (ref 6–20)
CHLORIDE: 105 mmol/L (ref 101–111)
CO2: 26 mmol/L (ref 22–32)
Calcium: 8.9 mg/dL (ref 8.9–10.3)
Creatinine, Ser: 1.55 mg/dL — ABNORMAL HIGH (ref 0.61–1.24)
GFR calc non Af Amer: 39 mL/min — ABNORMAL LOW (ref >60)
GFR, EST AFRICAN AMERICAN: 45 mL/min — AB (ref >60)
GLUCOSE: 98 mg/dL (ref 65–99)
POTASSIUM: 3.8 mmol/L (ref 3.5–5.1)
Sodium: 142 mmol/L (ref 135–145)

## 2015-03-16 LAB — MAGNESIUM: Magnesium: 2.1 mg/dL (ref 1.7–2.4)

## 2015-03-16 NOTE — Progress Notes (Signed)
[facility] PROGRESS NOTE   Luis Glenn [id number] DOB: December 12, 1927 DOA: 03/12/2015 PCP: Binnie Rail, MD  HPI/Subjective: Denies any complaints, no chest pain or shortness of breath. Awaits CTS evaluation.  Assessment/Plan: Principal Problem:   CHF (congestive heart failure) (HCC) Active Problems:   Essential hypertension   BPH (benign prostatic hyperplasia)   Atrial fibrillation (HCC)   Anemia   Cellulitis of left leg   Chronic kidney disease, stage III (moderate)   Dependent edema   Acute on chronic diastolic congestive heart failure:  -Patient reports increasing lower extremity edema. BNP elevated at 1212. Last 2-D echo showed LVEF of 60-65%. - Strict ins and outs - A spinal act on increased to 25 mg twice a day - Lasix 40 mg IV every 12 hours, following take/output and renal function closely. Elevate lower extremities.   Pericardial effusion -Last echo 06/2014 which was noted to be a large effusion without hemodynamic effects. Patient apparently declined pericardial window at that time. -2-D echo repeated and showed enlargement of pericardial effusion, cardiology to evaluate. -Cardiology evaluated the patient recommend CTS consultation.   Cellulitis: Acute,?.  -Patient with redness of the anterior shin of the left leg. Patient appears to be afebrile white blood cell count 4.9. - The redness appears to be hemosiderin discoloration from chronic congestion. -Cannot rule out cellulitis, will do short course of antibiotics. Ace wraps as well.  Atrial fibrillation on Eliquis: Appears rate controlled CHA2DS2-VASc score 4 - Continue Eliquis, no evidence of bleeding.  Chronic kidney disease stage III: Patient's baseline creatinine has been anywhere from 1.58-1.85 over the last year. On presentation patient's creatinine is elevated at 1.8 - Continue to monitor  Essential Hypertension - Continue terazosin  Hyperkalemia: Potassium elevated at 5.2 on admission,  no EKG changes observed, and patient was given 40 mg of IV Lasix. -Potassium is 4.6 today.  Anemia: hemoglobin is 11.5 -This is likely anemia of chronic kidney disease.  Hyperlipidemia - continue simvastatin  BPH -Continue Flomax   Code Status: Full Code Family Communication: Plan discussed with the patient. Disposition Plan: Remains inpatient Diet: Diet Heart Room service appropriate?: Yes; Fluid consistency:: Thin  Consultants:  None  Procedures:  None  Antibiotics:  Clindamycin (indicate start date, and stop date if known)   Objective: Filed Vitals:   03/16/15 0508 03/16/15 0939  BP: 125/55 136/60  Pulse: 83 76  Temp: 99 F (37.2 C) 98.8 F (37.1 C)  Resp: 18 20    Intake/Output Summary (Last 24 hours) at 03/16/15 1153 Last data filed at 03/16/15 0758  Gross per 24 hour  Intake    733 ml  Output   1750 ml  Net  -1017 ml   Filed Weights   03/14/15 0444 03/15/15 0454 03/16/15 0508  Weight: 92.806 kg (204 lb 9.6 oz) 90.447 kg (199 lb 6.4 oz) 87.635 kg (193 lb 3.2 oz)    Exam: General: Alert and awake, oriented x3, not in any acute distress. HEENT: anicteric sclera, pupils reactive to light and accommodation, EOMI CVS: S1-S2 clear, no murmur rubs or gallops Chest: clear to auscultation bilaterally, no wheezing, rales or rhonchi Abdomen: soft nontender, nondistended, normal bowel sounds, no organomegaly Extremities: no cyanosis, clubbing or edema noted bilaterally Neuro: Cranial nerves II-XII intact, no focal neurological deficits  Data Reviewed: Basic Metabolic Panel:  Recent Labs Lab 03/12/15 1934 03/13/15 0420 03/14/15 0440 03/15/15 0400 03/16/15 0320  NA 145 143 144 144 142  K 5.2* 4.6 3.9 3.7 3.8  CL 111  111 106 107 105  CO2 23 23 26 27 26   GLUCOSE 102* 113* 102* 91 98  BUN 38* 37* 32* 29* 27*  CREATININE 1.80* 1.75* 1.59* 1.55* 1.55*  CALCIUM 9.6 8.9 9.0 8.8* 8.9  MG  --   --   --   --  2.1   Liver Function Tests:  Recent  Labs Lab 03/11/15 1725  AST 16  ALT 9  ALKPHOS 107  BILITOT 0.6  PROT 6.0  ALBUMIN 3.4*   No results for input(s): LIPASE, AMYLASE in the last 168 hours. No results for input(s): AMMONIA in the last 168 hours. CBC:  Recent Labs Lab 03/12/15 1934 03/13/15 0420 03/14/15 0440  WBC 4.9 4.9 5.6  NEUTROABS 3.8  --   --   HGB 11.5* 10.0* 10.5*  HCT 36.7* 32.3* 32.6*  MCV 92.7 92.6 92.1  PLT 184 179 180   Cardiac Enzymes:  Recent Labs Lab 03/13/15 0045 03/13/15 0545 03/13/15 1147  TROPONINI <0.03 <0.03 <0.03   BNP (last 3 results)  Recent Labs  03/12/15 1934 03/15/15 1825  BNP 1212.7* 1484.5*    ProBNP (last 3 results)  Recent Labs  03/11/15 1725  PROBNP 839.0*    CBG: No results for input(s): GLUCAP in the last 168 hours.  Micro Recent Results (from the past 240 hour(s))  Culture, blood (Routine X 2) w Reflex to ID Panel     Status: None (Preliminary result)   Collection Time: 03/12/15  7:34 PM  Result Value Ref Range Status   Specimen Description BLOOD RIGHT ARM  Final   Special Requests BOTTLES DRAWN AEROBIC AND ANAEROBIC 5CC  Final   Culture NO GROWTH 3 DAYS  Final   Report Status PENDING  Incomplete  Culture, blood (Routine X 2) w Reflex to ID Panel     Status: None (Preliminary result)   Collection Time: 03/12/15  7:34 PM  Result Value Ref Range Status   Specimen Description BLOOD LEFT ARM  Final   Special Requests BOTTLES DRAWN AEROBIC AND ANAEROBIC 5CC  Final   Culture NO GROWTH 3 DAYS  Final   Report Status PENDING  Incomplete     Studies: No results found.  Scheduled Meds: . acetaminophen  500 mg Oral TID PC & HS  . allopurinol  300 mg Oral Daily  . apixaban  2.5 mg Oral BID  . fluticasone  2 spray Each Nare Daily  . furosemide  40 mg Intravenous Q12H  . simvastatin  40 mg Oral q1800  . sodium chloride flush  3 mL Intravenous Q12H  . spironolactone  25 mg Oral BID  . tamsulosin  0.4 mg Oral Daily  . vancomycin  1,000 mg  Intravenous Q24H   Continuous Infusions:      Time spent: 35 minutes    [facility] A  [facility] Pager [phone] If 7PM-7AM, please contact night-coverage at [email or web address], password [facility] 03/16/2015, 11:53 AM  LOS: 4 days

## 2015-03-16 NOTE — Progress Notes (Signed)
Subjective:  Breathing better;   Objective:   Vital Signs : Filed Vitals:   03/15/15 2014 03/16/15 0508 03/16/15 0939 03/16/15 1200  BP: 135/64 125/55 136/60 148/64  Pulse: 81 83 76 78  Temp: 98 F (36.7 C) 99 F (37.2 C) 98.8 F (37.1 C) 97.9 F (36.6 C)  TempSrc: Oral Oral Oral Oral  Resp: '18 18 20 18  ' Height:      Weight:  193 lb 3.2 oz (87.635 kg)    SpO2: 98% 96% 91% 94%    Intake/Output from previous day:  Intake/Output Summary (Last 24 hours) at 03/16/15 1326 Last data filed at 03/16/15 1000  Gross per 24 hour  Intake    923 ml  Output   2350 ml  Net  -1427 ml    I/O since admission: -6714  Wt Readings from Last 3 Encounters:  03/16/15 193 lb 3.2 oz (87.635 kg)  12/14/14 206 lb (93.441 kg)  11/12/14 192 lb (87.091 kg)    Medications: . acetaminophen  500 mg Oral TID PC & HS  . allopurinol  300 mg Oral Daily  . apixaban  2.5 mg Oral BID  . fluticasone  2 spray Each Nare Daily  . furosemide  40 mg Intravenous Q12H  . simvastatin  40 mg Oral q1800  . sodium chloride flush  3 mL Intravenous Q12H  . spironolactone  25 mg Oral BID  . tamsulosin  0.4 mg Oral Daily  . vancomycin  1,000 mg Intravenous Q24H       Physical Exam:   General appearance: alert, cooperative and no distress Neck: no adenopathy, no JVD, supple, symmetrical, trachea midline and thyroid not enlarged, symmetric, no tenderness/mass/nodules Lungs: no wheezing. Heart: irregularly irregular rhythm Abdomen: soft, non-tender; bowel sounds normal; no masses,  no organomegaly Extremities: legs wrapped with 1+ edema. Skin: Skin color, texture, turgor normal. No rashes or lesions Neurologic: Grossly normal   Rate: 78  Rhythm: AF with PVC's  ECG (independently read by me): AF at 82 with PVC's  Lab Results:     Recent Labs  03/14/15 0440 03/15/15 0400 03/16/15 0320  NA 144 144 142  K 3.9 3.7 3.8  CL 106 107 105  CO2 '26 27 26  ' GLUCOSE 102* 91 98  BUN 32* 29* 27*    CREATININE 1.59* 1.55* 1.55*  CALCIUM 9.0 8.8* 8.9  MG  --   --  2.1    Hepatic Function Latest Ref Rng 03/11/2015 10/17/2014 10/10/2013  Total Protein 6.0 - 8.3 g/dL 6.0 7.0 6.8  Albumin 3.5 - 5.2 g/dL 3.4(L) 4.3 3.8  AST 0 - 37 U/L '16 14 26  ' ALT 0 - 53 U/L '9 7 21  ' Alk Phosphatase 39 - 117 U/L 107 107 169(H)  Total Bilirubin 0.2 - 1.2 mg/dL 0.6 1.1 0.8  Bilirubin, Direct 0.0 - 0.3 mg/dL - 0.5(H) -     Recent Labs  03/14/15 0440  WBC 5.6  HGB 10.5*  HCT 32.6*  MCV 92.1  PLT 180    No results for input(s): TROPONINI in the last 72 hours.  Invalid input(s): CK, MB  Lab Results  Component Value Date   TSH 1.571 03/13/2015   No results for input(s): HGBA1C in the last 72 hours.  No results for input(s): PROT, ALBUMIN, AST, ALT, ALKPHOS, BILITOT, BILIDIR, IBILI in the last 72 hours. No results for input(s): INR in the last 72 hours. BNP (last 3 results)  Recent Labs  03/12/15 1934 03/15/15 1825  BNP 1212.7* 1484.5*    ProBNP (last 3 results)  Recent Labs  03/11/15 1725  PROBNP 839.0*     Lipid Panel     Component Value Date/Time   CHOL 116 09/18/2013 1402   TRIG 41.0 09/18/2013 1402   TRIG 81 01/10/2008   HDL 61.50 09/18/2013 1402   CHOLHDL 2 09/18/2013 1402   VLDL 8.2 09/18/2013 1402   LDLCALC 46 09/18/2013 1402     Imaging:  No results found.    Assessment/Plan:   Principal Problem:   CHF (congestive heart failure) (HCC) Active Problems:   Essential hypertension   BPH (benign prostatic hyperplasia)   Atrial fibrillation (HCC)   Anemia   Cellulitis of left leg   Chronic kidney disease, stage III (moderate)   Dependent edema    1. Enlarging pericardial effusion:  chronic moderate large pericardial effusion not responsive to ibuprofen and colchicine treatment since    2015, likely cause of enlargement is acute on chronic diastolic HF. Most recent echo effusion is large without tamponade physiology.  2. h/o chronic afib on  eliquis with cha2ds2vasc score of 5.  Rate controlled  3. Acute on chronic diastolic CHF:  Good diuresis -6714  4. Peripheral edema; slightly improved today.  I have had a discussion with patient concerning surgical evaluation for possible pericardial window with his chronic pericardial effusion not very responsive to treatment. The patient is very open to discussion and possible option.  Dr. Servando Snare will see today to discuss pros and cons of procedure. No need for emergent procedure with chronicity and without hemodynamic compromise, but if is to be performed eliquis will need to be held for >48hrs.  His daughter is in [place] and if decision is made will need to discuss with her.     Troy Sine, MD, [facility] 03/16/2015, 1:26 PM

## 2015-03-16 NOTE — Progress Notes (Signed)
Patient discussed with Dr. Hartford Poli- not stable for d/c today. Awaiting surgery clearance prior to ok for d/c.  Plan is for d/c to [facility] when medically stable. CSW services will continue to monitor.  Lorie Phenix. Pauline Good, [place]

## 2015-03-16 NOTE — Consult Note (Addendum)
[address]       [place] [address]             [phone]        Luis Glenn [place] Medical Record [id number] Date of Birth: [birth year]  Referring: Dr Claiborne Billings Primary Care: Binnie Rail, MD  Chief Complaint:    Chief Complaint  Patient presents with  . Leg Swelling    History of Present Illness:     [name] is a 82 yo male with known  of moderately large pericardial effusion last echo may 2016, gout, HTN, HLD, DM II and h/o afib on eliquis. He presented with worsening LE edema and L leg wound. Repeat echo done yesterday noted to have enlarging pericardial effusion.    Current Activity/ Functional Status: Patient is independent with mobility/ambulation, transfers, ADL's, IADL's.   Zubrod Score: At the time of surgery this patient's most appropriate activity status/level should be described as: []     0    Normal activity, no symptoms []     1    Restricted in physical strenuous activity but ambulatory, able to do out light work [x]     2    Ambulatory and capable of self care, unable to do work activities, up and about                 more than 50%  Of the time                            []     3    Only limited self care, in bed greater than 50% of waking hours []     4    Completely disabled, no self care, confined to bed or chair []     5    Moribund  Past Medical History  Diagnosis Date  . GOUT   . PERIPHERAL VASCULAR DISEASE   . DIVERTICULOSIS, COLON   . BENIGN PROSTATIC HYPERTROPHY   . DEPRESSION   . DIABETES MELLITUS, TYPE II   . GERD   . HYPERLIPIDEMIA   . HYPERTENSION   . CATARACTS   . OSTEOARTHRITIS   . ANXIETY   . ALLERGIC RHINITIS     Past Surgical History  Procedure Laterality Date  . Tonsillectomy  10/1942    History  Smoking status  . Former Smoker -- 3.00 packs/day for 40 years  . Types: Cigarettes  . Quit date: 02/09/1989  Smokeless tobacco  . Not on file    History  Alcohol Use  . Yes    Comment:  occassional    Social History   Social History  . Marital Status: Widowed    Spouse Name: N/A  . Number of Children: N/A  . Years of Education: N/A   Occupational History  . Retired    Social History Main Topics  . Smoking status: Former Smoker -- 3.00 packs/day for 40 years    Types: Cigarettes    Quit date: 02/09/1989  . Smokeless tobacco: Not on file  . Alcohol Use: Yes     Comment: occassional  . Drug Use: No  . Sexual Activity: Not on file   Other Topics Concern  . Not on file   Social History Narrative   Widowed - wife [name] passed 07/2013    Allergies  Allergen Reactions  . Penicillins Swelling and Rash    Has patient had a PCN reaction causing  immediate rash, facial/tongue/throat swelling, SOB or lightheadedness with hypotension: NO Has patient had a PCN reaction causing severe rash involving mucus membranes or skin necrosis:NO Has patient had a PCN reaction that required hospitalization No Has patient had a PCN reaction occurring within the last 10 years: NO If all of the above answers are "NO", then may proceed with Cephalosporin use.    Current Facility-Administered Medications  Medication Dose Route Frequency Provider Last Rate Last Dose  . acetaminophen (TYLENOL) tablet 500 mg  500 mg Oral TID PC & HS Norval Morton, MD   500 mg at 03/15/15 2208  . albuterol (PROVENTIL) (2.5 MG/3ML) 0.083% nebulizer solution 2.5 mg  2.5 mg Nebulization Q2H PRN Norval Morton, MD      . allopurinol (ZYLOPRIM) tablet 300 mg  300 mg Oral Daily Norval Morton, MD   300 mg at 03/16/15 0942  . ALPRAZolam Duanne Moron) tablet 0.5 mg  0.5 mg Oral TID PRN Norval Morton, MD      . apixaban Arne Cleveland) tablet 2.5 mg  2.5 mg Oral BID Norval Morton, MD   2.5 mg at 03/16/15 0942  . fluticasone (FLONASE) 50 MCG/ACT nasal spray 2 spray  2 spray Each Nare Daily Norval Morton, MD   2 spray at 03/16/15 0942  . furosemide (LASIX) injection 40 mg  40 mg Intravenous Q12H Norval Morton, MD    40 mg at 03/16/15 0751  . morphine 2 MG/ML injection 2 mg  2 mg Intravenous Q3H PRN Norval Morton, MD      . ondansetron (ZOFRAN) tablet 4 mg  4 mg Oral Q6H PRN Norval Morton, MD   4 mg at 03/16/15 1433   Or  . ondansetron (ZOFRAN) injection 4 mg  4 mg Intravenous Q6H PRN Norval Morton, MD   4 mg at 03/15/15 2008  . oxyCODONE (Oxy IR/ROXICODONE) immediate release tablet 5 mg  5 mg Oral BID PRN Norval Morton, MD      . simvastatin (ZOCOR) tablet 40 mg  40 mg Oral q1800 Norval Morton, MD   40 mg at 03/15/15 1812  . sodium chloride flush (NS) 0.9 % injection 3 mL  3 mL Intravenous Q12H Norval Morton, MD   3 mL at 03/16/15 0753  . spironolactone (ALDACTONE) tablet 25 mg  25 mg Oral BID Troy Sine, MD   25 mg at 03/16/15 0751  . tamsulosin (FLOMAX) capsule 0.4 mg  0.4 mg Oral Daily Norval Morton, MD   0.4 mg at 03/16/15 0942  . vancomycin (VANCOCIN) IVPB 1000 mg/200 mL premix  1,000 mg Intravenous Q24H Rebecka Apley, RPH 200 mL/hr at 03/15/15 2008 1,000 mg at 03/15/15 2008    Prescriptions prior to admission  Medication Sig Dispense Refill Last Dose  . acetaminophen (TYLENOL) 500 MG tablet Take 500 mg by mouth 4 (four) times daily - after meals and at bedtime. Take 4 times every day per patient   03/12/2015 at Unknown time  . allopurinol (ZYLOPRIM) 300 MG tablet Take 1 tablet (300 mg total) by mouth daily. 30 tablet 6 03/11/2015 at Unknown time  . ALPRAZolam (XANAX) 0.5 MG tablet Take 1 tablet (0.5 mg total) by mouth 3 (three) times daily as needed for anxiety. 90 tablet 5 03/12/2015 at Unknown time  . amLODipine-benazepril (LOTREL) 10-20 MG per capsule TAKE 1 CAPSULE BY MOUTH DAILY 90 capsule 3 03/11/2015 at Unknown time  . apixaban (ELIQUIS) 2.5 MG TABS tablet Take  1 tablet (2.5 mg total) by mouth 2 (two) times daily. 60 tablet 9 03/12/2015 at Unknown time  . fluticasone (FLONASE) 50 MCG/ACT nasal spray Place 2 sprays into both nostrils daily. 16 g 6 03/11/2015 at Unknown time  .  furosemide (LASIX) 40 MG tablet Take 1 tablet (40 mg total) by mouth 2 (two) times daily. 180 tablet 3 03/12/2015 at Unknown time  . oxyCODONE (OXY IR/ROXICODONE) 5 MG immediate release tablet Take 1 tablet (5 mg total) by mouth 2 (two) times daily as needed for severe pain. 60 tablet 0 03/11/2015 at Unknown time  . Potassium Chloride ER 20 MEQ TBCR TAKE 1 TABLET BY MOUTH DAILY 30 tablet 11 03/11/2015 at Unknown time  . simvastatin (ZOCOR) 40 MG tablet Take 1 tablet (40 mg total) by mouth daily at 6 PM. 90 tablet 1 03/11/2015 at Unknown time  . spironolactone (ALDACTONE) 25 MG tablet Take 1 tablet (25 mg total) by mouth daily. 30 tablet 3 03/11/2015 at Unknown time  . tamsulosin (FLOMAX) 0.4 MG CAPS capsule TAKE ONE CAPSULE BY MOUTH DAILY 90 capsule 3 03/11/2015 at Unknown time  . terazosin (HYTRIN) 10 MG capsule TAKE 1 CAPSULE (10 MG TOTAL) BY MOUTH DAILY. 90 capsule 1 03/11/2015 at Unknown time  . FLUoxetine (PROZAC) 10 MG capsule Take 1 capsule (10 mg total) by mouth daily. (Patient not taking: Reported on 03/12/2015) 90 capsule 3 Not Taking at Unknown time    Family History  Problem Relation Age of Onset  . Diabetes Mother   . Hypertension Mother   . Diabetes Father   . Hypertension Father   . Alcohol abuse Other   . Breast cancer Other      Review of Systems:      Cardiac Review of Systems: Y or N  Chest Pain [ n   ]  Resting SOB [  y ] Exertional SOB  [ y ]  Orthopnea Blue.Reese  ]   Pedal Edema [n   ]    Palpitations [ y ] Syncope  [  n]   Presyncope [ n  ]  General Review of Systems: [Y] = yes [  ]=no Constitional: recent weight change [  ]; anorexia [  ]; fatigue [  ]; nausea [  ]; night sweats [  ]; fever [  ]; or chills [  ]                                                               Dental: poor dentition[  ]; Last Dentist visit:   Eye : blurred vision [  ]; diplopia [   ]; vision changes [  ];  Amaurosis fugax[  ]; Resp: cough [  ];  wheezing[  ];  hemoptysis[  ]; shortness of breath[   ]; paroxysmal nocturnal dyspnea[  ]; dyspnea on exertion[  ]; or orthopnea[  ];  GI:  gallstones[  ], vomiting[  ];  dysphagia[  ]; melena[  ];  hematochezia [  ]; heartburn[  ];   Hx of  Colonoscopy[  ]; GU: kidney stones [  ]; hematuria[  ];   dysuria [  ];  nocturia[  ];  history of     obstruction [  ]; urinary frequency [  ]  Skin: rash, swelling[  ];, hair loss[  ];  peripheral edema[  ];  or itching[  ]; Musculosketetal: myalgias[  ];  joint swelling[  ];  joint erythema[  ];  joint pain[  ];  back pain[  ];  Heme/Lymph: bruising[  ];  bleeding[  ];  anemia[  ];  Neuro: TIA[  ];  headaches[  ];  stroke[  ];  vertigo[  ];  seizures[  ];   paresthesias[  ];  difficulty walking[  ];  Psych:depression[  ]; anxiety[  ];  Endocrine: diabetes[  ];  thyroid dysfunction[  ];  Immunizations: Flu [  ]; Pneumococcal[  ];  Other:  Physical Exam: BP 148/64 mmHg  Pulse 78  Temp(Src) 97.9 F (36.6 C) (Oral)  Resp 18  Ht 6\' 2"  (1.88 m)  Wt 193 lb 3.2 oz (87.635 kg)  BMI 24.79 kg/m2  SpO2 94%   General appearance: alert, cooperative, appears stated age and no distress Head: Normocephalic, without obvious abnormality, atraumatic Neck: no adenopathy, no carotid bruit, no JVD, supple, symmetrical, trachea midline and thyroid not enlarged, symmetric, no tenderness/mass/nodules Lymph nodes: Cervical, supraclavicular, and axillary nodes normal. Resp: diminished breath sounds anterior - bilateral Back: symmetric, no curvature. ROM normal. No CVA tenderness. Cardio: irregularly irregular rhythm GI: soft, non-tender; bowel sounds normal; no masses,  no organomegaly Extremities: Both legs wrapped extensively with pressure dressings secondary to weeping and edema Neurologic: Grossly normal Patient is alert and talkative and able to relate his history in good detail  Diagnostic Studies & Laboratory data:     Recent Radiology Findings:  Dg Chest Port 1 View  03/12/2015  CLINICAL DATA:   Shortness of breath and known pericardial effusion. EXAM: PORTABLE CHEST 1 VIEW COMPARISON:  CT 07/11/2014 FINDINGS: Single view of the chest again demonstrates enlargement of the cardiac silhouette. Findings are compatible with a large amount of pericardial fluid. In addition, there are prominent central vascular structures and concern for some pulmonary edema. Limited evaluation of the left lung base due to the enlarged cardiac silhouette. IMPRESSION: Enlarged cardiac silhouette related to a large pericardial effusion. Evidence for vascular congestion or mild pulmonary edema. Electronically Signed   By: Markus Daft M.D.   On: 03/12/2015 19:06    I have independently reviewed the above radiologic studies.  Recent Lab Findings: Lab Results  Component Value Date   WBC 5.6 03/14/2015   HGB 10.5* 03/14/2015   HCT 32.6* 03/14/2015   PLT 180 03/14/2015   GLUCOSE 98 03/16/2015   CHOL 116 09/18/2013   TRIG 41.0 09/18/2013   HDL 61.50 09/18/2013   LDLCALC 46 09/18/2013   ALT 9 03/11/2015   AST 16 03/11/2015   NA 142 03/16/2015   K 3.8 03/16/2015   CL 105 03/16/2015   CREATININE 1.55* 03/16/2015   BUN 27* 03/16/2015   CO2 26 03/16/2015   TSH 1.571 03/13/2015   HGBA1C 4.8 10/17/2014   Chronic Kidney Disease   Stage I     GFR >90  Stage II    GFR 60-89  Stage IIIA GFR 45-59  Stage IIIB GFR 30-44  Stage IV   GFR 15-29  Stage V    GFR  <15  Lab Results  Component Value Date   CREATININE 1.55* 03/16/2015   Estimated Creatinine Clearance: 40.5 mL/min (by C-G formula based on Cr of 1.55).  Echo: Study Conclusions  - Left ventricle: The cavity size was normal. Wall thickness was normal. Systolic function was normal. The estimated  ejection fraction was in the range of 55% to 60%. Wall motion was normal; there were no regional wall motion abnormalities. - Aortic valve: There was mild regurgitation. - Mitral valve: Mildly to moderately calcified annulus. - Left atrium: The atrium  was severely dilated. - Right ventricle: Systolic function was moderately reduced. - Right atrium: The atrium was moderately dilated. - Pulmonary arteries: Systolic pressure was moderately increased. PA peak pressure: 50 mm Hg (S). - Pericardium, extracardiac: A large, free-flowing pericardial effusion was identified circumferential to the heart.  Impressions:  - There is a very large pericardial effusion that has enlarged in size compared to his previous echo in May , 2016.   Assessment / Plan:   Nonspecific  Large pericardial effusion- chronic but enlarging.  Not responsive to colchicine Chronic kidney disease stage IIIB  I discussed with the patient and his daughter over the phone treatment options.  With enlarging pericardial effusion of unknown cause.  Unlikely to be malignant, I recommend to the patient that we proceed with drainage.  Option of pericardiocentesis versus subxiphoid pericardial window has been discussed with him and his daughter.  We'll plan on stopping his Eliquis today and potentially on Tuesday be ready to proceed with either pericardiocentesis or pericardial window  drainage or the other depending on the patient's final choice after discussing further with his daughter.    I  spent 40 minutes counseling the patient face to face and 50% or more the  time was spent in counseling and coordination of care. The total time spent in the appointment was 60 minutes.    Grace Isaac MD      [address] [place] [address] Office [phone]   [place]eeper [phone]  03/16/2015 3:47 PM

## 2015-03-17 LAB — BASIC METABOLIC PANEL
ANION GAP: 12 (ref 5–15)
BUN: 26 mg/dL — ABNORMAL HIGH (ref 6–20)
CALCIUM: 8.9 mg/dL (ref 8.9–10.3)
CO2: 29 mmol/L (ref 22–32)
CREATININE: 1.54 mg/dL — AB (ref 0.61–1.24)
Chloride: 104 mmol/L (ref 101–111)
GFR calc Af Amer: 46 mL/min — ABNORMAL LOW (ref >60)
GFR, EST NON AFRICAN AMERICAN: 39 mL/min — AB (ref >60)
GLUCOSE: 97 mg/dL (ref 65–99)
Potassium: 3.7 mmol/L (ref 3.5–5.1)
Sodium: 145 mmol/L (ref 135–145)

## 2015-03-17 LAB — CULTURE, BLOOD (ROUTINE X 2)
CULTURE: NO GROWTH
Culture: NO GROWTH

## 2015-03-17 MED ORDER — ACETAMINOPHEN 325 MG PO TABS
650.0000 mg | ORAL_TABLET | Freq: Four times a day (QID) | ORAL | Status: DC | PRN
  Administered 2015-03-18: 650 mg via ORAL
  Filled 2015-03-17: qty 2

## 2015-03-17 MED ORDER — CEPHALEXIN 500 MG PO CAPS
500.0000 mg | ORAL_CAPSULE | Freq: Two times a day (BID) | ORAL | Status: AC
  Administered 2015-03-17 – 2015-03-21 (×10): 500 mg via ORAL
  Filled 2015-03-17 (×10): qty 1

## 2015-03-17 NOTE — Progress Notes (Signed)
Subjective:  Breathing better;   Objective:   Vital Signs : Filed Vitals:   03/16/15 0939 03/16/15 1200 03/16/15 2022 03/17/15 0351  BP: 136/60 148/64 136/76 152/62  Pulse: 76 78 75 51  Temp: 98.8 F (37.1 C) 97.9 F (36.6 C) 98.7 F (37.1 C) 98.1 F (36.7 C)  TempSrc: Oral Oral Oral Oral  Resp: _0 Height:      Weight:    189 lb 3.2 oz (85.821 kg)  SpO2: 91% 94% 98% 94%    Intake/Output from previous day:  Intake/Output Summary (Last 24 hours) at 03/17/15 1214 Last data filed at 03/17/15 1107  Gross per 24 hour  Intake    603 ml  Output   1780 ml  Net  -1177 ml    I/O since admission: -8011  Wt Readings from Last 3 Encounters:  03/17/15 189 lb 3.2 oz (85.821 kg)  12/14/14 206 lb (93.441 kg)  11/12/14 192 lb (87.091 kg)    Medications: . allopurinol  300 mg Oral Daily  . fluticasone  2 spray Each Nare Daily  . furosemide  40 mg Intravenous Q12H  . simvastatin  40 mg Oral q1800  . sodium chloride flush  3 mL Intravenous Q12H  . spironolactone  25 mg Oral BID  . tamsulosin  0.4 mg Oral Daily  . vancomycin  1,000 mg Intravenous Q24H       Physical Exam:   General appearance: alert, cooperative and no distress Neck: no adenopathy, no JVD, supple, symmetrical, trachea midline and thyroid not enlarged, symmetric, no tenderness/mass/nodules Lungs: no wheezing. Heart: irregularly irregular rhythm Abdomen: soft, non-tender; bowel sounds normal; no masses,  no organomegaly Extremities: legs wrapped with 1+ edema. Skin: Skin color, texture, turgor normal. No rashes or lesions Neurologic: Grossly normal   Rate: 78  Rhythm: AF with PVC's  ECG (independently read by me): AF at 82 with PVC's  Lab Results:     Recent Labs  03/15/15 0400 03/16/15 0320 03/17/15 0307  NA 144 142 145  K 3.7 3.8 3.7  CL 107 105 104  CO2 _1 GLUCOSE 91 98 97  BUN 29* 27* 26*  CREATININE 1.55* 1.55* 1.54*  CALCIUM 8.8* 8.9 8.9  MG  --  2.1  --      Hepatic Function Latest Ref Rng 03/11/2015 10/17/2014 10/10/2013  Total Protein 6.0 - 8.3 g/dL 6.0 7.0 6.8  Albumin 3.5 - 5.2 g/dL 3.4(L) 4.3 3.8  AST 0 - 37 U/L _2 ALT 0 - 53 U/L _3 Alk Phosphatase 39 - 117 U/L 107 107 169(H)  Total Bilirubin 0.2 - 1.2 mg/dL 0.6 1.1 0.8  Bilirubin, Direct 0.0 - 0.3 mg/dL - 0.5(H) -    No results for input(s): WBC, NEUTROABS, HGB, HCT, MCV, PLT in the last 72 hours.  No results for input(s): TROPONINI in the last 72 hours.  Invalid input(s): CK, MB  Lab Results  Component Value Date   TSH 1.571 03/13/2015   No results for input(s): HGBA1C in the last 72 hours.  No results for input(s): PROT, ALBUMIN, AST, ALT, ALKPHOS, BILITOT, BILIDIR, IBILI in the last 72 hours. No results for input(s): INR in the last 72 hours. BNP (last 3 results)  Recent Labs  03/12/15 1934 03/15/15 1825  BNP 1212.7* 1484.5*    ProBNP (last 3 results)  Recent Labs  03/11/15 1725  PROBNP 839.0*     Lipid Panel  Component Value Date/Time   CHOL 116 09/18/2013 1402   TRIG 41.0 09/18/2013 1402   TRIG 81 01/10/2008   HDL 61.50 09/18/2013 1402   CHOLHDL 2 09/18/2013 1402   VLDL 8.2 09/18/2013 1402   LDLCALC 46 09/18/2013 1402     Imaging:  No results found.    Assessment/Plan:   Principal Problem:   CHF (congestive heart failure) (HCC) Active Problems:   Essential hypertension   BPH (benign prostatic hyperplasia)   Atrial fibrillation (HCC)   Anemia   Cellulitis of left leg   Chronic kidney disease, stage III (moderate)   Dependent edema   Chronic anticoagulation   1. Enlarging pericardial effusion:  chronic moderate large pericardial effusion not responsive to ibuprofen and colchicine treatment since    2015, likely cause of enlargement is acute on chronic diastolic HF. Most recent echo effusion is large without tamponade physiology.  2. h/o chronic afib on eliquis with cha2ds2vasc score of 5.  Rate  controlled  3. Acute on chronic diastolic CHF:  Excellent diuresis, now -7891  4. Peripheral edema; slightly improved today.  Appreciate Dr. Everrett Coombe evauation.  Eliquis was held since last night's dose.  With 8 liters of diuresis, pt is breathing much better and edema is significantly improved.  Will recheck a limited echo tomorrow to re-asses effusion size with duiresis; and possible pericardial window on Tuesday.   Troy Sine, MD, [facility] 03/17/2015, 12:14 PM

## 2015-03-17 NOTE — Progress Notes (Signed)
[facility] PROGRESS NOTE   Luis Glenn [id number] DOB: [birth year] DOA: 03/12/2015 PCP: Binnie Rail, MD  HPI/Subjective: Denies any complaints, no shortness of breath or chest pain. CTS evaluated patient, pericardial window versus pericardiocentesis. Continue to hold Eliquis, likely to have procedure on Tuesday.  Assessment/Plan: Principal Problem:   CHF (congestive heart failure) (HCC) Active Problems:   Essential hypertension   BPH (benign prostatic hyperplasia)   Atrial fibrillation (HCC)   Anemia   Cellulitis of left leg   Chronic kidney disease, stage III (moderate)   Dependent edema   Chronic anticoagulation   Acute on chronic diastolic congestive heart failure:  -Patient reports increasing lower extremity edema. BNP elevated at 1212. Last 2-D echo showed LVEF of 60-65%. - Strict ins and outs - A spinal act on increased to 25 mg twice a day - Lasix 40 mg IV every 12 hours, following take/output and renal function closely. Elevate lower extremities.  Pericardial effusion -Last echo 06/2014 which was noted to be a large effusion without hemodynamic effects. Patient apparently declined pericardial window at that time. -2-D echo repeated and showed enlargement of pericardial effusion, cardiology to evaluate. -CTS evaluated the patient, patient will have either pericardiocentesis or pericardial window on Tuesday.   Cellulitis: Acute,?.  -Patient with redness of the anterior shin of the left leg. Patient appears to be afebrile white blood cell count 4.9. - The redness appears to be hemosiderin discoloration from chronic congestion. -Cannot rule out cellulitis, will do short course of antibiotics. Ace wraps as well.  Atrial fibrillation on Eliquis: Appears rate controlled CHA2DS2-VASc score 4 - Continue Eliquis, no evidence of bleeding.  Chronic kidney disease stage III: Patient's baseline creatinine has been anywhere from 1.58-1.85 over the last year. On  presentation patient's creatinine is elevated at 1.8 - Continue to monitor  Essential Hypertension - Continue terazosin  Hyperkalemia: Potassium elevated at 5.2 on admission, no EKG changes observed, and patient was given 40 mg of IV Lasix. -Potassium is 4.6 today.  Anemia: hemoglobin is 11.5 -This is likely anemia of chronic kidney disease.  Hyperlipidemia - continue simvastatin  BPH -Continue Flomax   Code Status: Full Code Family Communication: Plan discussed with the patient. Disposition Plan: Remains inpatient Diet: Diet Heart Room service appropriate?: Yes; Fluid consistency:: Thin  Consultants:  None  Procedures:  None  Antibiotics:  Clindamycin (indicate start date, and stop date if known)   Objective: Filed Vitals:   03/16/15 2022 03/17/15 0351  BP: 136/76 152/62  Pulse: 75 51  Temp: 98.7 F (37.1 C) 98.1 F (36.7 C)  Resp: 18 18    Intake/Output Summary (Last 24 hours) at 03/17/15 1111 Last data filed at 03/17/15 1107  Gross per 24 hour  Intake    603 ml  Output   1780 ml  Net  -1177 ml   Filed Weights   03/15/15 0454 03/16/15 0508 03/17/15 0351  Weight: 90.447 kg (199 lb 6.4 oz) 87.635 kg (193 lb 3.2 oz) 85.821 kg (189 lb 3.2 oz)    Exam: General: Alert and awake, oriented x3, not in any acute distress. HEENT: anicteric sclera, pupils reactive to light and accommodation, EOMI CVS: S1-S2 clear, no murmur rubs or gallops Chest: clear to auscultation bilaterally, no wheezing, rales or rhonchi Abdomen: soft nontender, nondistended, normal bowel sounds, no organomegaly Extremities: no cyanosis, clubbing or edema noted bilaterally Neuro: Cranial nerves II-XII intact, no focal neurological deficits  Data Reviewed: Basic Metabolic Panel:  Recent Labs Lab 03/13/15 0420 03/14/15  0440 03/15/15 0400 03/16/15 0320 03/17/15 0307  NA 143 144 144 142 145  K 4.6 3.9 3.7 3.8 3.7  CL 111 106 107 105 104  CO2 23 26 27 26 29   GLUCOSE 113* 102*  91 98 97  BUN 37* 32* 29* 27* 26*  CREATININE 1.75* 1.59* 1.55* 1.55* 1.54*  CALCIUM 8.9 9.0 8.8* 8.9 8.9  MG  --   --   --  2.1  --    Liver Function Tests:  Recent Labs Lab 03/11/15 1725  AST 16  ALT 9  ALKPHOS 107  BILITOT 0.6  PROT 6.0  ALBUMIN 3.4*   No results for input(s): LIPASE, AMYLASE in the last 168 hours. No results for input(s): AMMONIA in the last 168 hours. CBC:  Recent Labs Lab 03/12/15 1934 03/13/15 0420 03/14/15 0440  WBC 4.9 4.9 5.6  NEUTROABS 3.8  --   --   HGB 11.5* 10.0* 10.5*  HCT 36.7* 32.3* 32.6*  MCV 92.7 92.6 92.1  PLT 184 179 180   Cardiac Enzymes:  Recent Labs Lab 03/13/15 0045 03/13/15 0545 03/13/15 1147  TROPONINI <0.03 <0.03 <0.03   BNP (last 3 results)  Recent Labs  03/12/15 1934 03/15/15 1825  BNP 1212.7* 1484.5*    ProBNP (last 3 results)  Recent Labs  03/11/15 1725  PROBNP 839.0*    CBG: No results for input(s): GLUCAP in the last 168 hours.  Micro Recent Results (from the past 240 hour(s))  Culture, blood (Routine X 2) w Reflex to ID Panel     Status: None (Preliminary result)   Collection Time: 03/12/15  7:34 PM  Result Value Ref Range Status   Specimen Description BLOOD RIGHT ARM  Final   Special Requests BOTTLES DRAWN AEROBIC AND ANAEROBIC 5CC  Final   Culture NO GROWTH 4 DAYS  Final   Report Status PENDING  Incomplete  Culture, blood (Routine X 2) w Reflex to ID Panel     Status: None (Preliminary result)   Collection Time: 03/12/15  7:34 PM  Result Value Ref Range Status   Specimen Description BLOOD LEFT ARM  Final   Special Requests BOTTLES DRAWN AEROBIC AND ANAEROBIC 5CC  Final   Culture NO GROWTH 4 DAYS  Final   Report Status PENDING  Incomplete     Studies: No results found.  Scheduled Meds: . allopurinol  300 mg Oral Daily  . fluticasone  2 spray Each Nare Daily  . furosemide  40 mg Intravenous Q12H  . simvastatin  40 mg Oral q1800  . sodium chloride flush  3 mL Intravenous Q12H    . spironolactone  25 mg Oral BID  . tamsulosin  0.4 mg Oral Daily  . vancomycin  1,000 mg Intravenous Q24H   Continuous Infusions:      Time spent: 35 minutes    [facility] A  [facility] Pager [phone] If 7PM-7AM, please contact night-coverage at [email or web address], password [facility] 03/17/2015, 11:11 AM  LOS: 5 days

## 2015-03-17 NOTE — Progress Notes (Signed)
Patient having 7 beats of V-tach. Dr. Hartford Poli notified. 12 lead EKG ordered stat.

## 2015-03-18 ENCOUNTER — Ambulatory Visit: Payer: Medicare Other | Admitting: Gastroenterology

## 2015-03-18 ENCOUNTER — Encounter (HOSPITAL_COMMUNITY): Payer: Self-pay | Admitting: Certified Registered Nurse Anesthetist

## 2015-03-18 ENCOUNTER — Inpatient Hospital Stay (HOSPITAL_COMMUNITY): Payer: Medicare Other

## 2015-03-18 DIAGNOSIS — L03116 Cellulitis of left lower limb: Secondary | ICD-10-CM | POA: Insufficient documentation

## 2015-03-18 DIAGNOSIS — I3139 Other pericardial effusion (noninflammatory): Secondary | ICD-10-CM | POA: Diagnosis present

## 2015-03-18 DIAGNOSIS — I5033 Acute on chronic diastolic (congestive) heart failure: Secondary | ICD-10-CM | POA: Diagnosis present

## 2015-03-18 DIAGNOSIS — I319 Disease of pericardium, unspecified: Secondary | ICD-10-CM

## 2015-03-18 DIAGNOSIS — I313 Pericardial effusion (noninflammatory): Secondary | ICD-10-CM | POA: Diagnosis present

## 2015-03-18 LAB — BASIC METABOLIC PANEL
ANION GAP: 11 (ref 5–15)
BUN: 26 mg/dL — AB (ref 6–20)
CHLORIDE: 101 mmol/L (ref 101–111)
CO2: 30 mmol/L (ref 22–32)
Calcium: 8.9 mg/dL (ref 8.9–10.3)
Creatinine, Ser: 1.58 mg/dL — ABNORMAL HIGH (ref 0.61–1.24)
GFR calc Af Amer: 44 mL/min — ABNORMAL LOW (ref >60)
GFR, EST NON AFRICAN AMERICAN: 38 mL/min — AB (ref >60)
GLUCOSE: 98 mg/dL (ref 65–99)
POTASSIUM: 3.5 mmol/L (ref 3.5–5.1)
Sodium: 142 mmol/L (ref 135–145)

## 2015-03-18 MED ORDER — FUROSEMIDE 40 MG PO TABS
40.0000 mg | ORAL_TABLET | Freq: Two times a day (BID) | ORAL | Status: DC
  Administered 2015-03-18 – 2015-03-20 (×5): 40 mg via ORAL
  Filled 2015-03-18 (×5): qty 1

## 2015-03-18 NOTE — Progress Notes (Signed)
      [address]       [place] [address]             [phone]                   Procedure(s) (LRB): SUBXYPHOID PERICARDIAL WINDOW (N/A) TRANSESOPHAGEAL ECHOCARDIOGRAM (TEE) (N/A)  LOS: 6 days   Subjective: Feels well. Repeat echo shows increased fluid in spite of diuresis.   Objective: Vital signs in last 24 hours: Patient Vitals for the past 24 hrs:  BP Temp Temp src Pulse Resp SpO2 Weight  03/18/15 2006 (!) 149/57 mmHg 98.1 F (36.7 C) Oral 77 17 - -  03/18/15 1500 (!) 155/57 mmHg 98.2 F (36.8 C) Oral 77 16 93 % -  03/18/15 1203 (!) 148/53 mmHg - - 69 18 96 % -  03/18/15 0800 (!) 145/69 mmHg - Oral 73 18 93 % -  03/18/15 0426 (!) 145/60 mmHg 97.9 F (36.6 C) Oral 83 18 98 % 184 lb 3.2 oz (83.553 kg)    Filed Weights   03/16/15 0508 03/17/15 0351 03/18/15 0426  Weight: 193 lb 3.2 oz (87.635 kg) 189 lb 3.2 oz (85.821 kg) 184 lb 3.2 oz (83.553 kg)    Hemodynamic parameters for last 24 hours:    Intake/Output from previous day: 02/05 0701 - 02/06 0700 In: 483 [P.O.:480; I.V.:3] Out: 1950 [Urine:1950] Intake/Output this shift: Total I/O In: -  Out: 100 [Urine:100]  Scheduled Meds: . allopurinol  300 mg Oral Daily  . cephALEXin  500 mg Oral Q12H  . fluticasone  2 spray Each Nare Daily  . furosemide  40 mg Oral BID  . simvastatin  40 mg Oral q1800  . sodium chloride flush  3 mL Intravenous Q12H  . spironolactone  25 mg Oral BID  . tamsulosin  0.4 mg Oral Daily   Continuous Infusions:  PRN Meds:.acetaminophen, albuterol, ALPRAZolam, morphine injection, ondansetron **OR** ondansetron (ZOFRAN) IV, oxyCODONE  Leg edema decrease No evidence tamponade   Lab Results: CBC:No results for input(s): WBC, HGB, HCT, PLT in the last 72 hours. BMET:  Recent Labs  03/17/15 0307 03/18/15 0325  NA 145 142  K 3.7 3.5  CL 104 101  CO2 29 30  GLUCOSE 97 98  BUN 26* 26*  CREATININE 1.54* 1.58*  CALCIUM 8.9 8.9    PT/INR: No results for  input(s): LABPROT, INR in the last 72 hours.   Radiology No results found.   Assessment/Plan: S/P Procedure(s) (LRB): SUBXYPHOID PERICARDIAL WINDOW (N/A) TRANSESOPHAGEAL ECHOCARDIOGRAM (TEE) (N/A)  I have discussed case with Dr Burt Knack and patient. Talked to patients daughter on phone. Currently off Eliquis. Patient has considered treatment options , will proceed with pericardiocentesis tomorrow by Cardiology, if recurrent then pericardial window.    Luis Isaac MD 03/18/2015 8:41 PM

## 2015-03-18 NOTE — Progress Notes (Signed)
[facility] PROGRESS NOTE   Luis Glenn [id number] DOB: 06/10/1927 DOA: 03/12/2015 PCP: Binnie Rail, MD  HPI/Subjective: No complaints, and less shortness of breath about 8.9 L negative fluid balance, lost 22 pounds since admission. Limited 2-D echo today to evaluate pericardial effusion.  Assessment/Plan: Principal Problem:   CHF (congestive heart failure) (HCC) Active Problems:   Essential hypertension   BPH (benign prostatic hyperplasia)   Atrial fibrillation (HCC)   Anemia   Cellulitis of left leg   Chronic kidney disease, stage III (moderate)   Dependent edema   Chronic anticoagulation   Acute on chronic diastolic (congestive) heart failure (HCC)   Pericardial effusion without cardiac tamponade   Acute on chronic diastolic congestive heart failure:  -Patient reports increasing lower extremity edema. BNP elevated at 1212. Last 2-D echo showed LVEF of 60-65%. -Strict ins and outs -A spinal act on increased to 25 mg twice a day -Lasix 40 mg IV every 12 hours, following take/output and renal function closely. Elevate lower extremities.  Pericardial effusion -Last echo 06/2014 which was noted to be a large effusion without hemodynamic effects. Patient apparently declined pericardial window at that time. -2-D echo repeated and showed enlargement of pericardial effusion, cardiology to evaluate. -CTS evaluated the patient, patient will have either pericardiocentesis or pericardial window on Tuesday.   Cellulitis: Acute,? -Patient with redness of the anterior shin of the left leg. Patient appears to be afebrile white blood cell count 4.9. -The redness appears to be hemosiderin discoloration from chronic congestion. -Cannot rule out cellulitis, will do short course of antibiotics. Ace wraps as well.  Atrial fibrillation on Eliquis: Appears rate controlled CHA2DS2-VASc score 4 -Continue Eliquis, no evidence of bleeding.  Chronic kidney disease stage III:  Patient's baseline creatinine has been anywhere from 1.58-1.85 over the last year. On presentation patient's creatinine is elevated at 1.8 - Continue to monitor  Essential Hypertension - Continue terazosin  Hyperkalemia: Potassium elevated at 5.2 on admission, no EKG changes observed, and patient was given 40 mg of IV Lasix. -Potassium is 4.6 today.  Anemia: hemoglobin is 11.5 -This is likely anemia of chronic kidney disease.  Hyperlipidemia - continue simvastatin  BPH -Continue Flomax   Code Status: Full Code Family Communication: Plan discussed with the patient. Disposition Plan: Remains inpatient Diet: Diet Heart Room service appropriate?: Yes; Fluid consistency:: Thin  Consultants:  None  Procedures:  None  Antibiotics:  Clindamycin (indicate start date, and stop date if known)   Objective: Filed Vitals:   03/18/15 0800 03/18/15 1203  BP: 145/69 148/53  Pulse: 73 69  Temp: 98.6 F (37 C)   Resp: 18 18    Intake/Output Summary (Last 24 hours) at 03/18/15 1255 Last data filed at 03/18/15 1209  Gross per 24 hour  Intake    862 ml  Output   1795 ml  Net   -933 ml   Filed Weights   03/16/15 0508 03/17/15 0351 03/18/15 0426  Weight: 87.635 kg (193 lb 3.2 oz) 85.821 kg (189 lb 3.2 oz) 83.553 kg (184 lb 3.2 oz)    Exam: General: Alert and awake, oriented x3, not in any acute distress. HEENT: anicteric sclera, pupils reactive to light and accommodation, EOMI CVS: S1-S2 clear, no murmur rubs or gallops Chest: clear to auscultation bilaterally, no wheezing, rales or rhonchi Abdomen: soft nontender, nondistended, normal bowel sounds, no organomegaly Extremities: no cyanosis, clubbing or edema noted bilaterally Neuro: Cranial nerves II-XII intact, no focal neurological deficits  Data Reviewed: Basic Metabolic  Panel:  Recent Labs Lab 03/14/15 0440 03/15/15 0400 03/16/15 0320 03/17/15 0307 03/18/15 0325  NA 144 144 142 145 142  K 3.9 3.7 3.8 3.7 3.5   CL 106 107 105 104 101  CO2 26 27 26 29 30   GLUCOSE 102* 91 98 97 98  BUN 32* 29* 27* 26* 26*  CREATININE 1.59* 1.55* 1.55* 1.54* 1.58*  CALCIUM 9.0 8.8* 8.9 8.9 8.9  MG  --   --  2.1  --   --    Liver Function Tests:  Recent Labs Lab 03/11/15 1725  AST 16  ALT 9  ALKPHOS 107  BILITOT 0.6  PROT 6.0  ALBUMIN 3.4*   No results for input(s): LIPASE, AMYLASE in the last 168 hours. No results for input(s): AMMONIA in the last 168 hours. CBC:  Recent Labs Lab 03/12/15 1934 03/13/15 0420 03/14/15 0440  WBC 4.9 4.9 5.6  NEUTROABS 3.8  --   --   HGB 11.5* 10.0* 10.5*  HCT 36.7* 32.3* 32.6*  MCV 92.7 92.6 92.1  PLT 184 179 180   Cardiac Enzymes:  Recent Labs Lab 03/13/15 0045 03/13/15 0545 03/13/15 1147  TROPONINI <0.03 <0.03 <0.03   BNP (last 3 results)  Recent Labs  03/12/15 1934 03/15/15 1825  BNP 1212.7* 1484.5*    ProBNP (last 3 results)  Recent Labs  03/11/15 1725  PROBNP 839.0*    CBG: No results for input(s): GLUCAP in the last 168 hours.  Micro Recent Results (from the past 240 hour(s))  Culture, blood (Routine X 2) w Reflex to ID Panel     Status: None   Collection Time: 03/12/15  7:34 PM  Result Value Ref Range Status   Specimen Description BLOOD RIGHT ARM  Final   Special Requests BOTTLES DRAWN AEROBIC AND ANAEROBIC 5CC  Final   Culture NO GROWTH 5 DAYS  Final   Report Status 03/17/2015 FINAL  Final  Culture, blood (Routine X 2) w Reflex to ID Panel     Status: None   Collection Time: 03/12/15  7:34 PM  Result Value Ref Range Status   Specimen Description BLOOD LEFT ARM  Final   Special Requests BOTTLES DRAWN AEROBIC AND ANAEROBIC 5CC  Final   Culture NO GROWTH 5 DAYS  Final   Report Status 03/17/2015 FINAL  Final     Studies: No results found.  Scheduled Meds: . allopurinol  300 mg Oral Daily  . cephALEXin  500 mg Oral Q12H  . fluticasone  2 spray Each Nare Daily  . furosemide  40 mg Oral BID  . simvastatin  40 mg Oral  q1800  . sodium chloride flush  3 mL Intravenous Q12H  . spironolactone  25 mg Oral BID  . tamsulosin  0.4 mg Oral Daily   Continuous Infusions:      Time spent: 35 minutes    [facility] A  [facility] Pager [phone] If 7PM-7AM, please contact night-coverage at [email or web address], password [facility] 03/18/2015, 12:55 PM  LOS: 6 days

## 2015-03-18 NOTE — Care Management Important Message (Signed)
Important Message  Patient Details  Name: Luis Glenn MRN: [id number] Date of Birth: 1927-09-10   Medicare Important Message Given:  Yes    [name] 03/18/2015, 12:30 PMImportant Message  Patient Details  Name: Luis Glenn MRN: [id number] Date of Birth: 1928-01-06   Medicare Important Message Given:  Yes    [name] 03/18/2015, 12:30 PM

## 2015-03-18 NOTE — Progress Notes (Signed)
Physical Therapy Treatment Patient Details Name: Luis Glenn MRN: [id number] DOB: 04/16/1927 Today's Date: 03/18/2015    History of Present Illness 82 year old male with a past medical history significant for HTN, PVD, HLD, diabetes mellitus type 2, depression, BPH, pericardial effusion last echo in 06/2013; who presents with complaints of leg swelling in setting of CHF exacerbation and LE cellulitis.    PT Comments    Patient continues to demonstrate progress towards PT goals. Ambulated in hall this session with one standing rest break and demonstrated some improvement with sit to stand transfers. Will continue to work with patient to maximize functional recovery.   Follow Up Recommendations  SNF (Pt does not have 24 hour supervision, would benefit from SNF)     Equipment Recommendations  None recommended by PT    Recommendations for Other Services       Precautions / Restrictions Precautions Precautions: Fall Restrictions Weight Bearing Restrictions: No    Mobility  Bed Mobility Overal bed mobility: Needs Assistance Bed Mobility: Supine to Sit     Supine to sit: Supervision Sit to supine: Min assist   General bed mobility comments: Supervision with HOB elevated  Transfers Overall transfer level: Needs assistance Equipment used: Rolling walker (2 wheeled) Transfers: Sit to/from Stand Sit to Stand: Min guard         General transfer comment: VCs for positioning at EOB, improved ability to power up to standing from low surface today  Ambulation/Gait Ambulation/Gait assistance: Supervision Ambulation Distance (Feet): 310 Feet (one standing rest break) Assistive device: Rolling walker (2 wheeled) Gait Pattern/deviations: Step-through pattern;Decreased stride length;Trunk flexed;Narrow base of support Gait velocity: decreased Gait velocity interpretation: Below normal speed for age/gender General Gait Details: VCs for upright posture, improved activity tolerance  this session   Stairs            Wheelchair Mobility    Modified Rankin (Stroke Patients Only)       Balance     Sitting balance-Leahy Scale: Good       Standing balance-Leahy Scale: Fair                      Cognition Arousal/Alertness: Awake/alert Behavior During Therapy: WFL for tasks assessed/performed Overall Cognitive Status: Within Functional Limits for tasks assessed                      Exercises Other Exercises Other Exercises: Sit to stand (Sit to stand x5 starting w/ elevated surface, titrating down)    General Comments        Pertinent Vitals/Pain Pain Assessment: No/denies pain    Home Living                      Prior Function            PT Goals (current goals can now be found in the care plan section) Acute Rehab PT Goals Patient Stated Goal: To go to rehab to get my strength up before I go home.  PT Goal Formulation: With patient Time For Goal Achievement: 03/29/15 Potential to Achieve Goals: Good Progress towards PT goals: Progressing toward goals    Frequency  Min 3X/week    PT Plan Discharge plan needs to be updated    Co-evaluation             End of Session Equipment Utilized During Treatment: Gait belt Activity Tolerance: Patient tolerated treatment well Patient left: in bed;with call bell/phone within  reach;with bed alarm set (sitting EOB)     Time: [id number] PT Time Calculation (min) (ACUTE ONLY): 15 min  Charges:  $Gait Training: 8-22 mins                    G CodesDuncan Dull March 20, 2015, 4:55 PM Alben Deeds, [place] DPT  [phone]

## 2015-03-18 NOTE — Progress Notes (Signed)
CSW spoke with patient today as he was feeling anxious about SNF plan vs d/c plan.  CSW reassured patient that he would have a SNF bed when stable for d/c. Per Dr. Hartford Poli- patient will not be stable for d/c until at least Wednesday or Thursday. Patient continues to request [facility] as first choice; [facility] as second choice. Last week- [facility] did not have a bed available.  CSW notified Rollene Fare at [facility] to continue to keep patient's name on wait list for a possible bed pending stability. Patient was very pleased with this. CSW will continue to monitor for possible date of stability.  Lorie Phenix. Pauline Good, [place]

## 2015-03-18 NOTE — Progress Notes (Signed)
PT Cancellation Note  Patient Details Name: Luis Glenn MRN: [id number] DOB: 03/27/1927   Cancelled Treatment:    Reason Eval/Treat Not Completed: Patient at procedure or test/unavailable (echo)   [name] 03/18/2015, 11:54 AM Alben Deeds, PT DPT  [phone]

## 2015-03-18 NOTE — Progress Notes (Signed)
  Echocardiogram 2D Echocardiogram has been performed.  Luis Glenn 03/18/2015, 11:50 AM

## 2015-03-18 NOTE — Progress Notes (Addendum)
    Subjective:  No chest pain or dyspnea.   Objective:  Vital Signs in the last 24 hours: Temp:  [97.9 F (36.6 C)-98.8 F (37.1 C)] 98.6 F (37 C) (02/06 0800) Pulse Rate:  [72-83] 73 (02/06 0800) Resp:  [18] 18 (02/06 0800) BP: (145-150)/(60-69) 145/69 mmHg (02/06 0800) SpO2:  [93 %-98 %] 93 % (02/06 0800) Weight:  [83.553 kg (184 lb 3.2 oz)] 83.553 kg (184 lb 3.2 oz) (02/06 0426)  Intake/Output from previous day: 02/05 0701 - 02/06 0700 In: 63 [P.O.:480; I.V.:3] Out: 1950 [Urine:1950]  Physical Exam: Pt is alert and oriented, pleasant elderly male in NAD HEENT: normal Neck: JVP - normal Lungs: CTA bilaterally CV: RRR without murmur or gallop Abd: soft, NT, Positive BS, no hepatomegaly Ext: legs wrapped in ACE bandages, no obvious edema Skin: warm/dry no rash   Lab Results: No results for input(s): WBC, HGB, PLT in the last 72 hours.  Recent Labs  03/17/15 0307 03/18/15 0325  NA 145 142  K 3.7 3.5  CL 104 101  CO2 29 30  GLUCOSE 97 98  BUN 26* 26*  CREATININE 1.54* 1.58*   No results for input(s): TROPONINI in the last 72 hours.  Invalid input(s): CK, MB  Cardiac Studies: 2D Echo: Study Conclusions  - Left ventricle: The cavity size was normal. Wall thickness was normal. Systolic function was normal. The estimated ejection fraction was in the range of 55% to 60%. Wall motion was normal; there were no regional wall motion abnormalities. - Aortic valve: There was mild regurgitation. - Mitral valve: Mildly to moderately calcified annulus. - Left atrium: The atrium was severely dilated. - Right ventricle: Systolic function was moderately reduced. - Right atrium: The atrium was moderately dilated. - Pulmonary arteries: Systolic pressure was moderately increased. PA peak pressure: 50 mm Hg (S). - Pericardium, extracardiac: A large, free-flowing pericardial effusion was identified circumferential to the heart.  Impressions:  - There is a  very large pericardial effusion that has enlarged in size compared to his previous echo in May , 2016.  Assessment/Plan:  Large pericardial effusion without clinical signs of tamponade: chronic effusion but increased in size from baseline. Pt with Acute on chronic diastolic CHF, atrial fibrillation, and other comorbidities as outlined. He has been seen by Dr Servando Snare for evaluation of treatment options (pericardial window). This is tentatively planned for tomorrow pending echo reassessment today. I will follow-up with him after his echo is completed today. CHF much better compensated with IV diuresis. (10 Kilos down from admission). Plan Switch to oral lasix.   Sherren Mocha, M.D. 03/18/2015, 10:57 AM    Addendum:  Discussed with Dr Servando Snare. Reviewed echo images. Pt has discussed options with his daughter who is an Therapist, sports of many years. Considering his advanced age and frailty, we all agree it is reasonable to proceed with subxyphoid needle pericardiocentesis in the cath lab as first approach and reserve subxyphoid window for recurrent effusion. I have reviewed the risks, indications, and alternatives of pericardiocentesis with the patient who understands and agrees to proceed.   Sherren Mocha 03/18/2015 5:10 PM

## 2015-03-19 ENCOUNTER — Encounter (HOSPITAL_COMMUNITY)
Admission: EM | Disposition: A | Discharge status: Skilled Nursing Facility | Payer: Self-pay | Source: Home / Self Care | Attending: Internal Medicine

## 2015-03-19 HISTORY — PX: CARDIAC CATHETERIZATION: SHX172

## 2015-03-19 LAB — BODY FLUID CELL COUNT WITH DIFFERENTIAL
Eos, Fluid: 0 %
Lymphs, Fluid: 36 %
MONOCYTE-MACROPHAGE-SEROUS FLUID: 62 % (ref 50–90)
NEUTROPHIL FLUID: 2 % (ref 0–25)
WBC FLUID: 59 uL (ref 0–1000)

## 2015-03-19 LAB — GRAM STAIN

## 2015-03-19 LAB — AMYLASE: Amylase: 19 U/L — ABNORMAL LOW (ref 28–100)

## 2015-03-19 LAB — PROTIME-INR
INR: 1.36 (ref 0.00–1.49)
Prothrombin Time: 16.9 seconds — ABNORMAL HIGH (ref 11.6–15.2)

## 2015-03-19 LAB — LACTATE DEHYDROGENASE: LDH: 180 U/L (ref 98–192)

## 2015-03-19 SURGERY — CREATION, PERICARDIAL WINDOW, SUBXIPHOID APPROACH
Anesthesia: General | Site: Chest

## 2015-03-19 SURGERY — PERICARDIOCENTESIS

## 2015-03-19 MED ORDER — LIDOCAINE HCL (PF) 1 % IJ SOLN
INTRAMUSCULAR | Status: DC | PRN
  Administered 2015-03-19: 15 mL via SUBCUTANEOUS

## 2015-03-19 MED ORDER — MIDAZOLAM HCL 2 MG/2ML IJ SOLN
INTRAMUSCULAR | Status: DC | PRN
  Administered 2015-03-19: 1 mg via INTRAVENOUS

## 2015-03-19 MED ORDER — SODIUM CHLORIDE 0.9 % IV SOLN: INTRAVENOUS | Status: DC

## 2015-03-19 MED ORDER — MIDAZOLAM HCL 2 MG/2ML IJ SOLN
INTRAMUSCULAR | Status: AC
  Filled 2015-03-19: qty 2

## 2015-03-19 MED ORDER — SODIUM CHLORIDE 0.9 % IV SOLN: 250.0000 mL | INTRAVENOUS | Status: DC | PRN

## 2015-03-19 MED ORDER — ONDANSETRON HCL 4 MG/2ML IJ SOLN: 4.0000 mg | Freq: Four times a day (QID) | INTRAMUSCULAR | Status: DC | PRN

## 2015-03-19 MED ORDER — SODIUM CHLORIDE 0.9 % IV SOLN: INTRAVENOUS | Status: AC

## 2015-03-19 MED ORDER — SODIUM CHLORIDE 0.9% FLUSH
3.0000 mL | Freq: Two times a day (BID) | INTRAVENOUS | Status: DC
  Administered 2015-03-19 – 2015-03-22 (×4): 3 mL via INTRAVENOUS

## 2015-03-19 MED ORDER — ACETAMINOPHEN 325 MG PO TABS
650.0000 mg | ORAL_TABLET | ORAL | Status: DC | PRN
  Administered 2015-03-19: 650 mg via ORAL
  Filled 2015-03-19: qty 2

## 2015-03-19 MED ORDER — SODIUM CHLORIDE 0.9% FLUSH
3.0000 mL | Freq: Two times a day (BID) | INTRAVENOUS | Status: DC
Start: 2015-03-19 — End: 2015-03-19
  Administered 2015-03-19: 3 mL via INTRAVENOUS

## 2015-03-19 MED ORDER — SODIUM CHLORIDE 0.9% FLUSH: 3.0000 mL | INTRAVENOUS | Status: DC | PRN

## 2015-03-19 MED ORDER — LIDOCAINE HCL (PF) 1 % IJ SOLN
INTRAMUSCULAR | Status: AC
  Filled 2015-03-19: qty 30

## 2015-03-19 MED ORDER — ENSURE ENLIVE PO LIQD: 237.0000 mL | Freq: Two times a day (BID) | ORAL | Status: DC

## 2015-03-19 SURGICAL SUPPLY — 6 items
EVACUATOR 1/8 PVC DRAIN (DRAIN) ×2 IMPLANT
PACK CARDIAC CATHETERIZATION (CUSTOM PROCEDURE TRAY) ×2 IMPLANT
PERIVAC PERICARDIOCENTESIS 8.3 (TRAY / TRAY PROCEDURE) ×2 IMPLANT
TRANSDUCER W/MONITORING KIT (MISCELLANEOUS) ×2 IMPLANT
TUBING ART PRESS 72  MALE/FEM (TUBING) ×2
TUBING ART PRESS 72 MALE/FEM (TUBING) IMPLANT

## 2015-03-19 NOTE — Progress Notes (Signed)
Pt transf to 2h. Pt was act w [facility] for [id number] pta. sw working on snf. Will cont to follow.

## 2015-03-19 NOTE — Progress Notes (Signed)
[facility] PROGRESS NOTE   Luis Glenn [id number] DOB: 17-Feb-1927 DOA: 03/12/2015 PCP: Binnie Rail, MD  HPI/Subjective: Limited 2-D echo showed even worse effusion. Pericardiocentesis to be done today.  Barriers to discharge: Patient will have pericardiocentesis today, if stable can be discharged tomorrow to SNF  Assessment/Plan: Principal Problem:   CHF (congestive heart failure) (HCC) Active Problems:   Essential hypertension   BPH (benign prostatic hyperplasia)   Atrial fibrillation (HCC)   Anemia   Cellulitis of left leg   Chronic kidney disease, stage III (moderate)   Dependent edema   Chronic anticoagulation   Acute on chronic diastolic (congestive) heart failure (HCC)   Pericardial effusion without cardiac tamponade   Cellulitis of left lower extremity   Acute on chronic diastolic congestive heart failure:  -Patient reports increasing lower extremity edema. BNP elevated at 1212. Last 2-D echo showed LVEF of 60-65%. -Strict ins and outs -A spinal act on increased to 25 mg twice a day -Lasix 40 mg IV every 12 hours, following take/output and renal function closely. Elevate lower extremities.  Pericardial effusion -Last echo 06/2014 which was noted to be a large effusion without hemodynamic effects. Patient apparently declined pericardial window at that time. -2-D echo repeated and showed enlargement of pericardial effusion, cardiology to evaluate. -DCT Dr. Servando Snare evaluated the patient, recommended pericardiocentesis to be done today. -Patient probably can be discharged tomorrow if stable after the pericardiocentesis to SNF.   Cellulitis: Acute,? -Patient with redness of the anterior shin of the left leg. Patient appears to be afebrile white blood cell count 4.9. -The redness appears to be hemosiderin discoloration from chronic congestion. -Cannot rule out cellulitis, will do short course of antibiotics. Ace wraps as well.  Atrial fibrillation on  Eliquis: Appears rate controlled CHA2DS2-VASc score 4 -Continue Eliquis, no evidence of bleeding.  Chronic kidney disease stage III: Patient's baseline creatinine has been anywhere from 1.58-1.85 over the last year. On presentation patient's creatinine is elevated at 1.8 - Continue to monitor  Essential Hypertension - Continue terazosin  Hyperkalemia: Potassium elevated at 5.2 on admission, no EKG changes observed, and patient was given 40 mg of IV Lasix. -Potassium is 4.6 today.  Anemia: hemoglobin is 11.5 -This is likely anemia of chronic kidney disease.  Hyperlipidemia - continue simvastatin  BPH -Continue Flomax   Code Status: Full Code Family Communication: Plan discussed with the patient. Disposition Plan: Remains inpatient Diet: Diet NPO time specified Except for: Sips with Meds  Consultants:  None  Procedures:  None  Antibiotics:  Clindamycin (indicate start date, and stop date if known)   Objective: Filed Vitals:   03/18/15 2006 03/19/15 0517  BP: 149/57 140/60  Pulse: 77 74  Temp: 98.1 F (36.7 C) 97.8 F (36.6 C)  Resp: 17 18    Intake/Output Summary (Last 24 hours) at 03/19/15 1146 Last data filed at 03/19/15 0923  Gross per 24 hour  Intake    630 ml  Output   2050 ml  Net  -1420 ml   Filed Weights   03/17/15 0351 03/18/15 0426 03/19/15 0346  Weight: 85.821 kg (189 lb 3.2 oz) 83.553 kg (184 lb 3.2 oz) 82.464 kg (181 lb 12.8 oz)    Exam: General: Alert and awake, oriented x3, not in any acute distress. HEENT: anicteric sclera, pupils reactive to light and accommodation, EOMI CVS: S1-S2 clear, no murmur rubs or gallops Chest: clear to auscultation bilaterally, no wheezing, rales or rhonchi Abdomen: soft nontender, nondistended, normal bowel sounds, no  organomegaly Extremities: no cyanosis, clubbing or edema noted bilaterally Neuro: Cranial nerves II-XII intact, no focal neurological deficits  Data Reviewed: Basic Metabolic  Panel:  Recent Labs Lab 03/14/15 0440 03/15/15 0400 03/16/15 0320 03/17/15 0307 03/18/15 0325  NA 144 144 142 145 142  K 3.9 3.7 3.8 3.7 3.5  CL 106 107 105 104 101  CO2 26 27 26 29 30   GLUCOSE 102* 91 98 97 98  BUN 32* 29* 27* 26* 26*  CREATININE 1.59* 1.55* 1.55* 1.54* 1.58*  CALCIUM 9.0 8.8* 8.9 8.9 8.9  MG  --   --  2.1  --   --    Liver Function Tests: No results for input(s): AST, ALT, ALKPHOS, BILITOT, PROT, ALBUMIN in the last 168 hours. No results for input(s): LIPASE, AMYLASE in the last 168 hours. No results for input(s): AMMONIA in the last 168 hours. CBC:  Recent Labs Lab 03/12/15 1934 03/13/15 0420 03/14/15 0440  WBC 4.9 4.9 5.6  NEUTROABS 3.8  --   --   HGB 11.5* 10.0* 10.5*  HCT 36.7* 32.3* 32.6*  MCV 92.7 92.6 92.1  PLT 184 179 180   Cardiac Enzymes:  Recent Labs Lab 03/13/15 0045 03/13/15 0545 03/13/15 1147  TROPONINI <0.03 <0.03 <0.03   BNP (last 3 results)  Recent Labs  03/12/15 1934 03/15/15 1825  BNP 1212.7* 1484.5*    ProBNP (last 3 results)  Recent Labs  03/11/15 1725  PROBNP 839.0*    CBG: No results for input(s): GLUCAP in the last 168 hours.  Micro Recent Results (from the past 240 hour(s))  Culture, blood (Routine X 2) w Reflex to ID Panel     Status: None   Collection Time: 03/12/15  7:34 PM  Result Value Ref Range Status   Specimen Description BLOOD RIGHT ARM  Final   Special Requests BOTTLES DRAWN AEROBIC AND ANAEROBIC 5CC  Final   Culture NO GROWTH 5 DAYS  Final   Report Status 03/17/2015 FINAL  Final  Culture, blood (Routine X 2) w Reflex to ID Panel     Status: None   Collection Time: 03/12/15  7:34 PM  Result Value Ref Range Status   Specimen Description BLOOD LEFT ARM  Final   Special Requests BOTTLES DRAWN AEROBIC AND ANAEROBIC 5CC  Final   Culture NO GROWTH 5 DAYS  Final   Report Status 03/17/2015 FINAL  Final     Studies: No results found.  Scheduled Meds: . allopurinol  300 mg Oral Daily   . cephALEXin  500 mg Oral Q12H  . fluticasone  2 spray Each Nare Daily  . furosemide  40 mg Oral BID  . simvastatin  40 mg Oral q1800  . sodium chloride flush  3 mL Intravenous Q12H  . sodium chloride flush  3 mL Intravenous Q12H  . spironolactone  25 mg Oral BID  . tamsulosin  0.4 mg Oral Daily   Continuous Infusions: . sodium chloride         Time spent: 35 minutes    Trammell Bowden A  [facility] Pager [phone] If 7PM-7AM, please contact night-coverage at [email or web address], password [facility] 03/19/2015, 11:46 AM  LOS: 7 days

## 2015-03-19 NOTE — Progress Notes (Signed)
Nutrition Follow-up   INTERVENTION:  Provide Ensure Enlive po BID, each supplement provides 350 kcal and 20 grams of protein Continue Snacks BID Provide Multivitamin with minerals daily  NUTRITION DIAGNOSIS:   Inadequate oral intake related to poor appetite as evidenced by meal completion < 50%.  Ongoing  GOAL:   Patient will meet greater than or equal to 90% of their needs  Unmet  MONITOR:   PO intake, Labs, Skin, I & O's, Weight trends  REASON FOR ASSESSMENT:   Malnutrition Screening Tool    ASSESSMENT:   82 year old male with a past medical history significant for HTN, PVD, HLD, diabetes mellitus type 2, depression, BPH, pericardial effusion last echo in 06/2013; who presents with complaints of leg swelling. Patient notes a history of chronic lower extremity edema.  Pt at procedure earlier today then transferred to [facility]. Nursing care in progress at time of visit. Per nursing notes, pt has been eating 0-10% of most meals over the past few days; was eating 25-50% on the first 2 days of admission. His weight has dropped down to 180 lbs which is what he reported weighing prior to having edema.   Labs: low hemoglobin, glucose ranging 91 to 113 mg/dL  Diet Order:   NPO  Skin:  Wound (see comment) (cellulitis on left and right lower leg)  Last BM:  2/4  Height:   Ht Readings from Last 1 Encounters:  03/13/15 6\' 2"  (1.88 m)    Weight:   Wt Readings from Last 1 Encounters:  03/19/15 181 lb 12.8 oz (82.464 kg)    Ideal Body Weight:  86.4 kg  BMI:  Body mass index is 23.33 kg/(m^2).  Estimated Nutritional Needs:   Kcal:  2000-2200  Protein:  100-110 grams  Fluid:  2-2.2 L/day  EDUCATION NEEDS:   No education needs identified at this time  [place], LDN Inpatient Clinical Dietitian Pager: [phone] After Hours Pager: [phone]

## 2015-03-19 NOTE — Progress Notes (Signed)
    Subjective:  No chest pain or dyspnea.   Objective:  Vital Signs in the last 24 hours: Temp:  [97.8 F (36.6 C)-98.2 F (36.8 C)] 97.8 F (36.6 C) (02/07 0517) Pulse Rate:  [74-77] 74 (02/07 0517) Resp:  [16-18] 18 (02/07 0517) BP: (140-155)/(57-60) 140/60 mmHg (02/07 0517) SpO2:  [87 %-96 %] 94 % (02/07 1427) Weight:  [82.464 kg (181 lb 12.8 oz)] 82.464 kg (181 lb 12.8 oz) (02/07 0346)  Intake/Output from previous day: 02/06 0701 - 02/07 0700 In: 1132 [P.O.:1132] Out: 2145 [Urine:2145]  Physical Exam: Pt is alert and oriented, elderly male in NAD HEENT: normal Neck: JVP - normal Lungs: CTA bilaterally CV: RRR without murmur or gallop Abd: soft, NT, Positive BS, no hepatomegaly Ext: no edema, legs wrapped Skin: warm/dry no rash  Lab Results: No results for input(s): WBC, HGB, PLT in the last 72 hours.  Recent Labs  03/17/15 0307 03/18/15 0325  NA 145 142  K 3.7 3.5  CL 104 101  CO2 29 30  GLUCOSE 97 98  BUN 26* 26*  CREATININE 1.54* 1.58*   No results for input(s): TROPONINI in the last 72 hours.  Invalid input(s): CK, MB  Tele: Sinus rhythm, PVC's  Assessment/Plan:  1. Large pericardial effusion 2. Acute on chronic diastolic HF, improved with diuresis 3. Paroxysmal atrial fibrillation 4. CKD III  For pericardiocentesis today. I have reviewed risks, benefits, alternatives with the patient. His daughter is at the bedside. All questions answered. Will order limited 2D Echo tomorrow am to assess effusion post-drainage.    Sherren Mocha, M.D. 03/19/2015, 2:56 PM

## 2015-03-20 ENCOUNTER — Encounter (HOSPITAL_COMMUNITY): Payer: Self-pay | Admitting: Interventional Cardiology

## 2015-03-20 ENCOUNTER — Inpatient Hospital Stay (HOSPITAL_COMMUNITY): Payer: Medicare Other

## 2015-03-20 DIAGNOSIS — I319 Disease of pericardium, unspecified: Secondary | ICD-10-CM

## 2015-03-20 LAB — BASIC METABOLIC PANEL
ANION GAP: 12 (ref 5–15)
BUN: 23 mg/dL — ABNORMAL HIGH (ref 6–20)
CALCIUM: 8.8 mg/dL — AB (ref 8.9–10.3)
CO2: 31 mmol/L (ref 22–32)
Chloride: 101 mmol/L (ref 101–111)
Creatinine, Ser: 1.45 mg/dL — ABNORMAL HIGH (ref 0.61–1.24)
GFR, EST AFRICAN AMERICAN: 49 mL/min — AB (ref >60)
GFR, EST NON AFRICAN AMERICAN: 42 mL/min — AB (ref >60)
Glucose, Bld: 93 mg/dL (ref 65–99)
Potassium: 3.5 mmol/L (ref 3.5–5.1)
SODIUM: 144 mmol/L (ref 135–145)

## 2015-03-20 LAB — CBC
HCT: 34.7 % — ABNORMAL LOW (ref 39.0–52.0)
Hemoglobin: 11.1 g/dL — ABNORMAL LOW (ref 13.0–17.0)
MCH: 30 pg (ref 26.0–34.0)
MCHC: 32 g/dL (ref 30.0–36.0)
MCV: 93.8 fL (ref 78.0–100.0)
Platelets: 141 10*3/uL — ABNORMAL LOW (ref 150–400)
RBC: 3.7 MIL/uL — ABNORMAL LOW (ref 4.22–5.81)
RDW: 15.7 % — AB (ref 11.5–15.5)
WBC: 15.4 10*3/uL — ABNORMAL HIGH (ref 4.0–10.5)

## 2015-03-20 LAB — HEPARIN LEVEL (UNFRACTIONATED): HEPARIN UNFRACTIONATED: 0.87 [IU]/mL — AB (ref 0.30–0.70)

## 2015-03-20 MED ORDER — CETYLPYRIDINIUM CHLORIDE 0.05 % MT LIQD
7.0000 mL | Freq: Two times a day (BID) | OROMUCOSAL | Status: DC
  Administered 2015-03-21 – 2015-03-22 (×3): 7 mL via OROMUCOSAL

## 2015-03-20 MED ORDER — POTASSIUM CHLORIDE CRYS ER 20 MEQ PO TBCR: 40.0000 meq | EXTENDED_RELEASE_TABLET | Freq: Once | ORAL | Status: DC

## 2015-03-20 MED ORDER — HEPARIN (PORCINE) IN NACL 100-0.45 UNIT/ML-% IJ SOLN
900.0000 [IU]/h | INTRAMUSCULAR | Status: DC
  Administered 2015-03-20: 1200 [IU]/h via INTRAVENOUS
  Administered 2015-03-21: 900 [IU]/h via INTRAVENOUS
  Filled 2015-03-20 (×2): qty 250

## 2015-03-20 MED ORDER — RANOLAZINE ER 500 MG PO TB12: 500.0000 mg | ORAL_TABLET | Freq: Two times a day (BID) | ORAL | Status: DC

## 2015-03-20 NOTE — Progress Notes (Signed)
    Subjective:  No chest pain or dyspnea. Had some pain after pericardiocentesis yesterday but now well controlled with pain meds.   Objective:  Vital Signs in the last 24 hours: Temp:  [98.3 F (36.8 C)-98.6 F (37 C)] 98.6 F (37 C) (02/07 2000) Pulse Rate:  [0-84] 64 (02/08 0700) Resp:  [0-51] 20 (02/08 0700) BP: (120-166)/(46-100) 129/53 mmHg (02/08 0700) SpO2:  [0 %-99 %] 94 % (02/08 0700)  Intake/Output from previous day: 02/07 0701 - 02/08 0700 In: 497.5 [P.O.:60; I.V.:437.5] Out: 2575 [Urine:2050]  Physical Exam: Pt is alert and oriented, pleasant elderly male in NAD HEENT: normal Neck: JVP - normal Lungs: CTA bilaterally CV: RRR without murmur or gallop. Has drain in place from pericardiocentesis, has high output yellow colored fluid.  Abd: soft, NT, Positive BS, no hepatomegaly Ext: no edema, legs wrapped.  Skin: warm/dry no rash  Lab Results: No results for input(s): WBC, HGB, PLT in the last 72 hours.  Recent Labs  03/18/15 0325  NA 142  K 3.5  CL 101  CO2 30  GLUCOSE 98  BUN 26*  CREATININE 1.58*   No results for input(s): TROPONINI in the last 72 hours.  Invalid input(s): CK, MB  Tele: Sinus rhythm, PVC's, pause 1.5 highest.   Assessment/Plan:  1. Large pericardial effusion without tamponade since 2015, declined pericardial window in the past, failed ibuprofen/colchichine therapy. S/p pericardiocentesis yesterday with 1500cc fluid removal 2/7. Having high output (yellow fluid). Fluid analysis was unremarkable. Culture pending.  Has tube in place. Repeat ECHO pending today to reassess size of the effusion. If re-accumulates, may need pericardial window by CTS.  2. Acute on chronic diastolic HF, improved with diuresis - weight down by 11 kg, diuresing well.  3. Paroxysmal atrial fibrillation CHADSvasc 4- holding eliquis for now in case he needs pericardial window. Not bridging currently. May consider bridging with heparin for now. States his  insurance switched formulary so may change from eliquis to a different medication, will follow up with Dr. Acie Fredrickson regarding this.  4. CKD III - stable around baseline.  5. ?leg cellulitis - on abx per primary team, doubt this is infection, more likely venous stasis.    Dellia Nims, M.D. 03/20/2015, 8:18 AM    Patient seen, examined. Available data reviewed. Agree with findings, assessment, and plan as outlined by Dellia Nims, MD. Exam reveals an alert, oriented elderly male in no distress. Lung fields are clear. Heart is irregular with well controlled rate. There are no murmurs. There is no peripheral edema present. The patient had a very large pericardial effusion with 1500 cc drained yesterday at the time of pericardiocentesis. Reviewed with nursing and he has had 270 cc of drainage from his pericardial tube just this morning over a period of 3 hours. We will leave the drain in today and assess drainage over a 24-hour period. Will pull tube tomorrow if drainage has slowed down. If he continues to have significant pericardial fluid drainage will need to reconsider pericardial window. Will keep on heparin until decision made.   Sherren Mocha, M.D. 03/20/2015 11:43 AM

## 2015-03-20 NOTE — Progress Notes (Signed)
ANTICOAGULATION CONSULT NOTE - Initial Consult  Pharmacy Consult for Heparin  Indication: atrial fibrillation  Allergies  Allergen Reactions  . Penicillins Swelling and Rash    Has patient had a PCN reaction causing immediate rash, facial/tongue/throat swelling, SOB or lightheadedness with hypotension: NO Has patient had a PCN reaction causing severe rash involving mucus membranes or skin necrosis:NO Has patient had a PCN reaction that required hospitalization No Has patient had a PCN reaction occurring within the last 10 years: NO If all of the above answers are "NO", then may proceed with Cephalosporin use.    Patient Measurements: Height: 6\' 2"  (188 cm) Weight: 181 lb 12.8 oz (82.464 kg) (a scale) IBW/kg (Calculated) : 82.2  Vital Signs: Temp: 98.6 F (37 C) (02/08 1958) Temp Source: Oral (02/08 1958) BP: 147/65 mmHg (02/08 2027) Pulse Rate: 71 (02/08 2027)  Labs:  Recent Labs  03/18/15 0325 03/19/15 0751 03/20/15 0813 03/20/15 1959  HGB  --   --  11.1*  --   HCT  --   --  34.7*  --   PLT  --   --  141*  --   LABPROT  --  16.9*  --   --   INR  --  1.36  --   --   HEPARINUNFRC  --   --   --  0.87*  CREATININE 1.58*  --  1.45*  --     Estimated Creatinine Clearance: 43.3 mL/min (by C-G formula based on Cr of 1.45).   Medical History: Past Medical History  Diagnosis Date  . GOUT   . PERIPHERAL VASCULAR DISEASE   . DIVERTICULOSIS, COLON   . BENIGN PROSTATIC HYPERTROPHY   . DEPRESSION   . DIABETES MELLITUS, TYPE II   . GERD   . HYPERLIPIDEMIA   . HYPERTENSION   . CATARACTS   . OSTEOARTHRITIS   . ANXIETY   . ALLERGIC RHINITIS      Assessment: 82 y.o. male admitted on 03/12/2015 with cellulitis. On Xaretlo PTA for a fib, currently on hold for pericardiocentesis and possible need for pericardial window. Pharmacy consulted to begin IV heparin in the interim.   H/H stable, PLT 141   Level high at 0.87. No issues per RN  Goal of Therapy:  Heparin level  0.3-0.7 units/ml Monitor platelets by anticoagulation protocol: Yes   Plan:  Decrease heparin to 1000 units/hr 0300 HL  Daily HL/CBC  Monitor for s/s of bleeding  Levester Fresh, PharmD, BCPS, [facility] Clinical Pharmacist Pager [phone] 03/20/2015 8:53 PM

## 2015-03-20 NOTE — Progress Notes (Signed)
Physical Therapy Treatment Patient Details Name: Luis Glenn MRN: [id number] DOB: Sep 08, 1927 Today's Date: 03/20/2015    History of Present Illness 82 year old male with a past medical history significant for HTN, PVD, HLD, diabetes mellitus type 2, depression, BPH, pericardial effusion last echo in 06/2013; who presents with complaints of leg swelling. Patient notes a history of chronic lower extremity edema.    PT Comments    Pt admitted with above diagnosis. Pt currently with functional limitations due to generalized weakness, decreased strength and endurance. PT treatment session was limited to bed level activities today due to placement of pericardial drain. Pt participated well with therapy and tolerated exercises in supine. Pt instructed to perform BLE exercises intermittently in bed throughout the day. Pt was eager to get out of bed and will continue out of bed activity as soon as drain is cleared. Pt will continue to benefit from skilled PT to increase their independence and safety with mobility to allow discharge to the venue listed below.     Follow Up Recommendations  SNF;Supervision/Assistance - 24 hour     Equipment Recommendations  None recommended by PT    Recommendations for Other Services       Precautions / Restrictions Precautions Precautions: Fall Restrictions Weight Bearing Restrictions: No    Mobility  Bed Mobility Overal bed mobility: Needs Assistance Bed Mobility: Rolling Rolling: Min assist         General bed mobility comments: Helped pt roll to get on bed pan and then to change sheets and get cleaned up. Mostly needed assistance for safety of lines/leads. Rolled both directions several times and pt needed VC's for sequencing and hand placement.   Transfers                    Ambulation/Gait                 Stairs            Wheelchair Mobility    Modified Rankin (Stroke Patients Only)       Balance                                     Cognition Arousal/Alertness: Awake/alert Behavior During Therapy: WFL for tasks assessed/performed Overall Cognitive Status: Within Functional Limits for tasks assessed                      Exercises General Exercises - Lower Extremity Ankle Circles/Pumps: AROM;Strengthening;20 reps;Supine;Both Heel Slides: Strengthening;AROM;10 reps;Both;Supine Hip ABduction/ADduction: AROM;Both;10 reps;Supine Straight Leg Raises: AROM;Both;10 reps;Supine    General Comments General comments (skin integrity, edema, etc.): Treatment session limited to bed level activities due to placement of Pericardial drain.       Pertinent Vitals/Pain Pain Assessment: No/denies pain  HR between 70s-80s throughout activities. Intermittent PVC's and fluctuations between A-fib and Trigemeny with no signs of distress. Nursing aware.     Home Living                      Prior Function            PT Goals (current goals can now be found in the care plan section) Acute Rehab PT Goals Patient Stated Goal: to go home PT Goal Formulation: With patient Time For Goal Achievement: 03/29/15 Potential to Achieve Goals: Good Progress towards PT goals: Progressing toward goals    Frequency  Min 3X/week    PT Plan Current plan remains appropriate    Co-evaluation             End of Session Equipment Utilized During Treatment: Oxygen Activity Tolerance: Patient tolerated treatment well;Treatment limited secondary to medical complications (Comment) (Treatment limited to bed level due to pericardial drain) Patient left: in bed;with call bell/phone within reach     Time: [id number] PT Time Calculation (min) (ACUTE ONLY): 26 min  Charges:  $Therapeutic Exercise: 8-22 mins $Therapeutic Activity: 8-22 mins                    G Codes:      Colon Branch, SPT Colon Branch 03/20/2015, 3:25 PM

## 2015-03-20 NOTE — Progress Notes (Signed)
[facility] PROGRESS NOTE   JEVONTA OKRAY [id number] DOB: 1928-01-18 DOA: 03/12/2015 PCP: Binnie Rail, MD  HPI/Subjective: -feels ok, some pain/discomfort at site of drain  Assessment/Plan:  Large Pericardial effusion without Tamponade -2-D echo showed enlargement of previous pericardial effusion without tamponade physiology -Cards and CVTS following -s/p Pericardial drain, 1500cc removed, with high output from drain -FU ECHO today  Acute on chronic diastolic congestive heart failure:  -volume overloaded on exam, Last 2-D echo showed LVEF of 60-65%. -Follow I/Os, weights -was on IV lasix, now 40mg  Po BID -12l negative  Cellulitis vs Chronic venous stasis -with ACE wrap, on PO keflex -will continue Abx for few more days   Atrial fibrillation on Eliquis: Appears rate controlled CHA2DS2-VASc score 4 -Eliquis on hold for above procedure  Chronic kidney disease stage III:  -stable, monitor  Essential Hypertension - Continue terazosin  Hyperkalemia:  -resolved, corrected  Hyperlipidemia - continue simvastatin  BPH -Continue Flomax  Code Status: Full Code Family Communication: none at bedside Disposition Plan: Keep in SDU today  Consultants:  None  Procedures:  None  Antibiotics:  Clindamycin (indicate start date, and stop date if known)   Objective: Filed Vitals:   03/20/15 1100 03/20/15 1200  BP: 134/60 149/72  Pulse: 74 69  Temp:  98.7 F (37.1 C)  Resp: 21 20    Intake/Output Summary (Last 24 hours) at 03/20/15 1314 Last data filed at 03/20/15 1200  Gross per 24 hour  Intake  753.5 ml  Output   2590 ml  Net -1836.5 ml   Filed Weights   03/17/15 0351 03/18/15 0426 03/19/15 0346  Weight: 85.821 kg (189 lb 3.2 oz) 83.553 kg (184 lb 3.2 oz) 82.464 kg (181 lb 12.8 oz)    Exam: General: Alert and awake, oriented x3, not in any acute distress. HEENT: anicteric sclera, pupils reactive to light and accommodation, EOMI CVS:  S1-S2 clear, no murmur rubs or gallops Chest: clear to auscultation bilaterally, no wheezing, rales or rhonchi, pericardial drain noted Abdomen: soft nontender, nondistended, normal bowel sounds, no organomegaly Extremities: no cyanosis, clubbing or edema noted bilaterally Neuro: Cranial nerves II-XII intact, no focal neurological deficits  Data Reviewed: Basic Metabolic Panel:  Recent Labs Lab 03/15/15 0400 03/16/15 0320 03/17/15 0307 03/18/15 0325 03/20/15 0813  NA 144 142 145 142 144  K 3.7 3.8 3.7 3.5 3.5  CL 107 105 104 101 101  CO2 27 26 29 30 31   GLUCOSE 91 98 97 98 93  BUN 29* 27* 26* 26* 23*  CREATININE 1.55* 1.55* 1.54* 1.58* 1.45*  CALCIUM 8.8* 8.9 8.9 8.9 8.8*  MG  --  2.1  --   --   --    Liver Function Tests: No results for input(s): AST, ALT, ALKPHOS, BILITOT, PROT, ALBUMIN in the last 168 hours.  Recent Labs Lab 03/19/15 1450  AMYLASE 19*   No results for input(s): AMMONIA in the last 168 hours. CBC:  Recent Labs Lab 03/14/15 0440 03/20/15 0813  WBC 5.6 15.4*  HGB 10.5* 11.1*  HCT 32.6* 34.7*  MCV 92.1 93.8  PLT 180 141*   Cardiac Enzymes: No results for input(s): CKTOTAL, CKMB, CKMBINDEX, TROPONINI in the last 168 hours. BNP (last 3 results)  Recent Labs  03/12/15 1934 03/15/15 1825  BNP 1212.7* 1484.5*    ProBNP (last 3 results)  Recent Labs  03/11/15 1725  PROBNP 839.0*    CBG: No results for input(s): GLUCAP in the last 168 hours.  Micro Recent Results (  from the past 240 hour(s))  Culture, blood (Routine X 2) w Reflex to ID Panel     Status: None   Collection Time: 03/12/15  7:34 PM  Result Value Ref Range Status   Specimen Description BLOOD RIGHT ARM  Final   Special Requests BOTTLES DRAWN AEROBIC AND ANAEROBIC 5CC  Final   Culture NO GROWTH 5 DAYS  Final   Report Status 03/17/2015 FINAL  Final  Culture, blood (Routine X 2) w Reflex to ID Panel     Status: None   Collection Time: 03/12/15  7:34 PM  Result Value Ref  Range Status   Specimen Description BLOOD LEFT ARM  Final   Special Requests BOTTLES DRAWN AEROBIC AND ANAEROBIC 5CC  Final   Culture NO GROWTH 5 DAYS  Final   Report Status 03/17/2015 FINAL  Final  Gram stain     Status: None   Collection Time: 03/19/15  2:50 PM  Result Value Ref Range Status   Specimen Description FLUID PERICARDIAL  Final   Special Requests NONE  Final   Gram Stain   Final    RARE WBC PRESENT, PREDOMINANTLY MONONUCLEAR NO ORGANISMS SEEN    Report Status 03/19/2015 FINAL  Final     Studies: No results found.  Scheduled Meds: . allopurinol  300 mg Oral Daily  . cephALEXin  500 mg Oral Q12H  . feeding supplement (ENSURE ENLIVE)  237 mL Oral BID PC  . fluticasone  2 spray Each Nare Daily  . furosemide  40 mg Oral BID  . simvastatin  40 mg Oral q1800  . sodium chloride flush  3 mL Intravenous Q12H  . sodium chloride flush  3 mL Intravenous Q12H  . spironolactone  25 mg Oral BID  . tamsulosin  0.4 mg Oral Daily   Continuous Infusions: . heparin 1,200 Units/hr (03/20/15 1040)       Time spent: 35 minutes    Maizee Reinhold  [facility] Pager [phone] If 7PM-7AM, please contact night-coverage at [email or web address], password [facility] 03/20/2015, 1:14 PM  LOS: 8 days

## 2015-03-20 NOTE — Progress Notes (Signed)
  Echocardiogram 2D Echocardiogram limited has been performed.  Tresa Res 03/20/2015, 9:08 AM

## 2015-03-20 NOTE — Progress Notes (Signed)
ANTICOAGULATION CONSULT NOTE - Initial Consult  Pharmacy Consult for Heparin  Indication: atrial fibrillation  Allergies  Allergen Reactions  . Penicillins Swelling and Rash    Has patient had a PCN reaction causing immediate rash, facial/tongue/throat swelling, SOB or lightheadedness with hypotension: NO Has patient had a PCN reaction causing severe rash involving mucus membranes or skin necrosis:NO Has patient had a PCN reaction that required hospitalization No Has patient had a PCN reaction occurring within the last 10 years: NO If all of the above answers are "NO", then may proceed with Cephalosporin use.    Patient Measurements: Height: 6\' 2"  (188 cm) Weight: 181 lb 12.8 oz (82.464 kg) (a scale) IBW/kg (Calculated) : 82.2  Vital Signs: Temp: 100.2 F (37.9 C) (02/08 0800) Temp Source: Oral (02/08 0800) BP: 139/55 mmHg (02/08 1000) Pulse Rate: 74 (02/08 1000)  Labs:  Recent Labs  03/18/15 0325 03/19/15 0751 03/20/15 0813  HGB  --   --  11.1*  HCT  --   --  34.7*  PLT  --   --  141*  LABPROT  --  16.9*  --   INR  --  1.36  --   CREATININE 1.58*  --  1.45*    Estimated Creatinine Clearance: 43.3 mL/min (by C-G formula based on Cr of 1.45).   Medical History: Past Medical History  Diagnosis Date  . GOUT   . PERIPHERAL VASCULAR DISEASE   . DIVERTICULOSIS, COLON   . BENIGN PROSTATIC HYPERTROPHY   . DEPRESSION   . DIABETES MELLITUS, TYPE II   . GERD   . HYPERLIPIDEMIA   . HYPERTENSION   . CATARACTS   . OSTEOARTHRITIS   . ANXIETY   . ALLERGIC RHINITIS      Assessment: 82 y.o. male admitted on 03/12/2015 with cellulitis. On Xaretlo PTA for a fib, currently on hold for pericardiocentesis and possible need for pericardial window. Pharmacy consulted to begin IV heparin in the interim.   H/H stable, PLT 141   Goal of Therapy:  Heparin level 0.3-0.7 units/ml Monitor platelets by anticoagulation protocol: Yes   Plan:  No bolus per MD  Heparin @1200   units/hr 8 hr HL  Daily HL/CBC  Monitor for s/s of bleeding  Ugo Thoma C. Lennox [name], PharmD Pharmacy Resident  Pager: [phone] 03/20/2015 10:29 AM

## 2015-03-21 DIAGNOSIS — L03116 Cellulitis of left lower limb: Secondary | ICD-10-CM

## 2015-03-21 DIAGNOSIS — N400 Enlarged prostate without lower urinary tract symptoms: Secondary | ICD-10-CM

## 2015-03-21 LAB — CBC
HEMATOCRIT: 32.7 % — AB (ref 39.0–52.0)
HEMOGLOBIN: 10.2 g/dL — AB (ref 13.0–17.0)
MCH: 28.7 pg (ref 26.0–34.0)
MCHC: 31.2 g/dL (ref 30.0–36.0)
MCV: 91.9 fL (ref 78.0–100.0)
PLATELETS: 132 10*3/uL — AB (ref 150–400)
RBC: 3.56 MIL/uL — AB (ref 4.22–5.81)
RDW: 15.4 % (ref 11.5–15.5)
WBC: 9.8 10*3/uL (ref 4.0–10.5)

## 2015-03-21 LAB — MRSA PCR SCREENING: MRSA BY PCR: NEGATIVE

## 2015-03-21 LAB — HEPARIN LEVEL (UNFRACTIONATED)
Heparin Unfractionated: 0.73 IU/mL — ABNORMAL HIGH (ref 0.30–0.70)
Heparin Unfractionated: 0.44 IU/mL (ref 0.30–0.70)

## 2015-03-21 MED ORDER — HEPARIN (PORCINE) IN NACL 100-0.45 UNIT/ML-% IJ SOLN
900.0000 [IU]/h | INTRAMUSCULAR | Status: DC
  Administered 2015-03-21: 900 [IU]/h via INTRAVENOUS

## 2015-03-21 MED ORDER — POLYETHYLENE GLYCOL 3350 17 G PO PACK
17.0000 g | PACK | Freq: Every day | ORAL | Status: DC
  Administered 2015-03-21 – 2015-03-22 (×2): 17 g via ORAL
  Filled 2015-03-21 (×2): qty 1

## 2015-03-21 MED ORDER — FUROSEMIDE 40 MG PO TABS
60.0000 mg | ORAL_TABLET | Freq: Two times a day (BID) | ORAL | Status: DC
  Administered 2015-03-21 – 2015-03-22 (×3): 60 mg via ORAL
  Filled 2015-03-21 (×3): qty 1

## 2015-03-21 NOTE — Progress Notes (Signed)
[facility] PROGRESS NOTE   Luis Glenn [id number] DOB: [birth year] DOA: 03/12/2015 PCP: Binnie Rail, MD  HPI/Subjective: -feels ok,  pain/discomfort at site of drain better, breathing improved  Assessment/Plan:  Large Pericardial effusion without Tamponade -2-D echo showed enlargement of previous pericardial effusion without tamponade physiology -Cards and CVTS following -s/p Pericardial drain 2/7, 1500cc removed, with high output from drain -ECHO 2/8 with decreased size of effusion -drain output better -cultures negative, cytology negative  Acute on chronic diastolic congestive heart failure:  -was volume overloaded on exam, Last 2-D echo showed LVEF of 60-65%. -Follow I/Os, weights -was on IV lasix, now 40mg  Po BID -12L negative  Cellulitis vs Chronic venous stasis -with ACE wrap, on PO keflex -stop Abx tomorrow   Atrial fibrillation on Eliquis: Appears rate controlled CHA2DS2-VASc score 4 -Eliquis on hold due to above procedure -Now on IV Heparin, resume Eliquis when ok with Cards  Chronic kidney disease stage III:  -stable, monitor  Essential Hypertension - Continue terazosin  Hyperkalemia:  -resolved, corrected  Hyperlipidemia - continue simvastatin  BPH -Continue Flomax  Code Status: Full Code Family Communication: none at bedside Disposition Plan: Tx to tele Consultants:  None  Procedures:  None  Antibiotics:  Clindamycin (indicate start date, and stop date if known)   Objective: Filed Vitals:   03/21/15 1100 03/21/15 1128  BP: 132/51   Pulse: 75   Temp:  98.2 F (36.8 C)  Resp: 19     Intake/Output Summary (Last 24 hours) at 03/21/15 1203 Last data filed at 03/21/15 1100  Gross per 24 hour  Intake 825.78 ml  Output    925 ml  Net -99.22 ml   Filed Weights   03/18/15 0426 03/19/15 0346 03/21/15 1000  Weight: 83.553 kg (184 lb 3.2 oz) 82.464 kg (181 lb 12.8 oz) 79.6 kg (175 lb 7.8 oz)    Exam: General: Alert  and awake, oriented x3, not in any acute distress. HEENT: anicteric sclera, pupils reactive to light and accommodation, EOMI CVS: S1-S2 clear, no murmur rubs or gallops Chest: clear to auscultation bilaterally, no wheezing, rales or rhonchi, pericardial drain noted Abdomen: soft nontender, nondistended, normal bowel sounds, no organomegaly Extremities: no cyanosis, clubbing or edema noted bilaterally Neuro: Cranial nerves II-XII intact, no focal neurological deficits  Data Reviewed: Basic Metabolic Panel:  Recent Labs Lab 03/15/15 0400 03/16/15 0320 03/17/15 0307 03/18/15 0325 03/20/15 0813  NA 144 142 145 142 144  K 3.7 3.8 3.7 3.5 3.5  CL 107 105 104 101 101  CO2 27 26 29 30 31   GLUCOSE 91 98 97 98 93  BUN 29* 27* 26* 26* 23*  CREATININE 1.55* 1.55* 1.54* 1.58* 1.45*  CALCIUM 8.8* 8.9 8.9 8.9 8.8*  MG  --  2.1  --   --   --    Liver Function Tests: No results for input(s): AST, ALT, ALKPHOS, BILITOT, PROT, ALBUMIN in the last 168 hours.  Recent Labs Lab 03/19/15 1450  AMYLASE 19*   No results for input(s): AMMONIA in the last 168 hours. CBC:  Recent Labs Lab 03/20/15 0813 03/21/15 0325  WBC 15.4* 9.8  HGB 11.1* 10.2*  HCT 34.7* 32.7*  MCV 93.8 91.9  PLT 141* 132*   Cardiac Enzymes: No results for input(s): CKTOTAL, CKMB, CKMBINDEX, TROPONINI in the last 168 hours. BNP (last 3 results)  Recent Labs  03/12/15 1934 03/15/15 1825  BNP 1212.7* 1484.5*    ProBNP (last 3 results)  Recent Labs  03/11/15 1725  PROBNP 839.0*    CBG: No results for input(s): GLUCAP in the last 168 hours.  Micro Recent Results (from the past 240 hour(s))  Culture, blood (Routine X 2) w Reflex to ID Panel     Status: None   Collection Time: 03/12/15  7:34 PM  Result Value Ref Range Status   Specimen Description BLOOD RIGHT ARM  Final   Special Requests BOTTLES DRAWN AEROBIC AND ANAEROBIC 5CC  Final   Culture NO GROWTH 5 DAYS  Final   Report Status 03/17/2015 FINAL   Final  Culture, blood (Routine X 2) w Reflex to ID Panel     Status: None   Collection Time: 03/12/15  7:34 PM  Result Value Ref Range Status   Specimen Description BLOOD LEFT ARM  Final   Special Requests BOTTLES DRAWN AEROBIC AND ANAEROBIC 5CC  Final   Culture NO GROWTH 5 DAYS  Final   Report Status 03/17/2015 FINAL  Final  Culture, body fluid-bottle     Status: None (Preliminary result)   Collection Time: 03/19/15  2:50 PM  Result Value Ref Range Status   Specimen Description FLUID PERICARDIAL  Final   Special Requests NONE  Final   Culture NO GROWTH < 24 HOURS  Final   Report Status PENDING  Incomplete  Gram stain     Status: None   Collection Time: 03/19/15  2:50 PM  Result Value Ref Range Status   Specimen Description FLUID PERICARDIAL  Final   Special Requests NONE  Final   Gram Stain   Final    RARE WBC PRESENT, PREDOMINANTLY MONONUCLEAR NO ORGANISMS SEEN    Report Status 03/19/2015 FINAL  Final     Studies: No results found.  Scheduled Meds: . allopurinol  300 mg Oral Daily  . antiseptic oral rinse  7 mL Mouth Rinse BID  . cephALEXin  500 mg Oral Q12H  . feeding supplement (ENSURE ENLIVE)  237 mL Oral BID PC  . fluticasone  2 spray Each Nare Daily  . furosemide  60 mg Oral BID  . simvastatin  40 mg Oral q1800  . sodium chloride flush  3 mL Intravenous Q12H  . sodium chloride flush  3 mL Intravenous Q12H  . spironolactone  25 mg Oral BID  . tamsulosin  0.4 mg Oral Daily   Continuous Infusions: . heparin Stopped (03/21/15 0920)       Time spent: 35 minutes    Luis Glenn  [facility] Pager [phone] If 7PM-7AM, please contact night-coverage at [email or web address], password [facility] 03/21/2015, 12:03 PM  LOS: 9 days

## 2015-03-21 NOTE — Progress Notes (Signed)
BSW intern went to speak with patient about current plans and referral process. Patient states he is doing fine. Patient states that he still perfer [facility] over [facility] but either or will do. Patient is waiting on MD for further news. Patient has no questions or concerns to be address right now. BSW intern supervisor Kendell Bane  will follow with patient and patient bed offers.     [place] intern  [phone]

## 2015-03-21 NOTE — Consult Note (Signed)
   [facility] CM Inpatient Consult   03/21/2015  Luis Glenn 21-Oct-1927 [id number] Patient has been screened for [facility] [facility] services as an eligible member in [facility] registry with his Medicare Plan.  Will follow up and follow patient's progress with inpatient care manager.  For questions, please contact: Natividad Brood, RN BSN [facility] Liaison  [phone] business mobile phone Toll free office [phone]

## 2015-03-21 NOTE — Progress Notes (Addendum)
    Subjective:  No chest pain or dyspnea. He feels much better since admission mentally and physically. He states that today is the first time he actually is looking forward to his breakfast. UOP has decreased on PO lasix.   Objective:  Vital Signs in the last 24 hours: Temp:  [98.6 F (37 C)-100.2 F (37.9 C)] 98.8 F (37.1 C) (02/09 0300) Pulse Rate:  [40-112] 65 (02/09 0600) Resp:  [11-25] 13 (02/09 0600) BP: (114-149)/(46-77) 142/54 mmHg (02/09 0600) SpO2:  [91 %-99 %] 98 % (02/09 0600)  Intake/Output from previous day: 02/08 0701 - 02/09 0700 In: 691.8 [P.O.:480; I.V.:211.8] Out: 1315 [Urine:750; Drains:60]  Physical Exam: Pt is alert and oriented, pleasant elderly male in NAD HEENT: normal Neck: JVP - normal Lungs: CTA bilaterally CV: RRR without murmur or gallop. Has drain in place from pericardiocentesis, draining yellow fluid.   Abd: soft, NT, Positive BS, no hepatomegaly Ext: no edema, legs wrapped.  Skin: warm/dry no rash  Lab Results:  Recent Labs  03/20/15 0813 03/21/15 0325  WBC 15.4* 9.8  HGB 11.1* 10.2*  PLT 141* 132*    Recent Labs  03/20/15 0813  NA 144  K 3.5  CL 101  CO2 31  GLUCOSE 93  BUN 23*  CREATININE 1.45*   No results for input(s): TROPONINI in the last 72 hours.  Invalid input(s): CK, MB  Tele: Sinus rhythm, PVC's, pause 1.5 highest.   Assessment/Plan:  1. Large pericardial effusion without tamponade since 2015, declined pericardial window in the past, failed ibuprofen/colchichine therapy. S/p pericardiocentesis 2/7 with 1500cc fluid removal 2/7. Had high output yesterday but decreased today. Fluid analysis was unremarkable. Culture pending.  Repeat EcHO showed decreased size of the effusion. -since tube output decreased, we will pull tube out today and repeat limited ECHO tomorrow to reassess the effusion size.  - transfer out to tele today.  2. Acute on chronic diastolic HF, improved with diuresis - weight down by 11 kg,  diuresing slowed down with PO lasix (or could be not accurately recording urine output). He looks better clinically.  - increase lasix to 60 mg BID today, cont spironolactone 25mg  BID  3. Paroxysmal atrial fibrillation CHADSvasc 4- holding eliquis for now in case he needs pericardial window. started heparin for bridging yesterday. Continue this for now. States his insurance switched formulary so may change from eliquis to a different medication, will follow up with Dr. Acie Fredrickson regarding this.   4. CKD III - stable around baseline.  5. ?leg cellulitis - on abx per primary team, doubt this is infection, more likely venous stasis.    Dellia Nims, M.D. 03/21/2015, 7:50 AM  Patient seen, examined. Available data reviewed. Agree with findings, assessment, and plan as outlined by Dr Genene Churn.  Yesterday's echocardiogram is reviewed and there is decreased size of the pericardial effusion.  There is still a significant posterior effusion present.  Drainage has slowed down and I think the pericardial drain should be pulled today.  We'll repeat a limited echocardiogram tomorrow prior to hospital discharge.  The patient's heparin will be interrupted and pericardial drain will be pulled in about one hour.  Will transfer patient out of the ICU so that he can be mobilized.  Otherwise plans as outlined above.  Sherren Mocha, M.D. 03/21/2015 10:51 AM

## 2015-03-21 NOTE — Care Management Important Message (Signed)
Important Message  Patient Details  Name: Luis Glenn MRN: [id number] Date of Birth: 1927-05-02   Medicare Important Message Given:  Yes    [name] 03/21/2015, 8:01 AM

## 2015-03-21 NOTE — Progress Notes (Signed)
Per Dr. Burt Knack, restart heparin 2 hours after pericardial drain pull.  Drain pulled at 1400. Heparin to be restarted at 1600. [place], Ardeth Sportsman

## 2015-03-21 NOTE — Progress Notes (Signed)
ANTICOAGULATION CONSULT NOTE - Follow Up Consult  Pharmacy Consult for heparin Indication: atrial fibrillation    Labs:  Recent Labs  03/19/15 0751 03/20/15 0813 03/20/15 1959 03/21/15 0325  HGB  --  11.1*  --  10.2*  HCT  --  34.7*  --  32.7*  PLT  --  141*  --  132*  LABPROT 16.9*  --   --   --   INR 1.36  --   --   --   HEPARINUNFRC  --   --  0.87* 0.73*  CREATININE  --  1.45*  --   --      Assessment: 82yo male remains supratherapeutic on heparin after rate increase, closer to goal.  Goal of Therapy:  Heparin level 0.3-0.7 units/ml   Plan:  Will decrease heparin gtt slightly to 900 units/hr and check level in [place], PharmD, BCPS  03/21/2015,4:03 AM

## 2015-03-21 NOTE — Progress Notes (Signed)
Bed available at [facility] tomorrow per Ivin Booty- Admissions at facility. Awaiting stability per MD.  Patient is aware and agreeable to dc plan when stable.  He is very pleased to obtain a bed at [facility].  Lorie Phenix. Pauline Good, [place]

## 2015-03-21 NOTE — Progress Notes (Signed)
ANTICOAGULATION CONSULT NOTE - Follow Up Consult  Pharmacy Consult for heparin Indication: atrial fibrillation    Labs:  Recent Labs  03/19/15 0751 03/20/15 0813 03/20/15 1959 03/21/15 0325 03/21/15 1213  HGB  --  11.1*  --  10.2*  --   HCT  --  34.7*  --  32.7*  --   PLT  --  141*  --  132*  --   LABPROT 16.9*  --   --   --   --   INR 1.36  --   --   --   --   HEPARINUNFRC  --   --  0.87* 0.73* 0.44  CREATININE  --  1.45*  --   --   --      Assessment: 82yo male with PMH of afib on eliquis PTA. Eliquis on hold and IV heparin while awaiting decision on pericardial window. Heparin held earlier this morning for drain removal. Per MD, will start heparin back at 1600. Will start back at previous rate of 900units/hr (HL 0.44)   H/H stable, PLT wnl, no bleeding   Goal of Therapy:  Heparin level 0.3-0.7 units/ml   Plan:  -Re-start IV heparin @ 900units/hr (to start at 1600) -8hr HL -monitor for s/s of bleeding -F/U long-term AC restart plans  Luis Glenn Luis Glenn, PharmD Pharmacy Resident  Pager: [phone] 03/21/2015 3:20 PM

## 2015-03-22 ENCOUNTER — Encounter (HOSPITAL_COMMUNITY): Payer: Self-pay | Admitting: Physician Assistant

## 2015-03-22 ENCOUNTER — Other Ambulatory Visit: Payer: Self-pay | Admitting: Physician Assistant

## 2015-03-22 ENCOUNTER — Telehealth: Payer: Self-pay | Admitting: *Deleted

## 2015-03-22 ENCOUNTER — Inpatient Hospital Stay (HOSPITAL_COMMUNITY): Payer: Medicare Other

## 2015-03-22 DIAGNOSIS — L03116 Cellulitis of left lower limb: Secondary | ICD-10-CM | POA: Diagnosis not present

## 2015-03-22 DIAGNOSIS — I3139 Other pericardial effusion (noninflammatory): Secondary | ICD-10-CM

## 2015-03-22 DIAGNOSIS — I351 Nonrheumatic aortic (valve) insufficiency: Secondary | ICD-10-CM | POA: Diagnosis not present

## 2015-03-22 DIAGNOSIS — D631 Anemia in chronic kidney disease: Secondary | ICD-10-CM | POA: Diagnosis not present

## 2015-03-22 DIAGNOSIS — F411 Generalized anxiety disorder: Secondary | ICD-10-CM | POA: Diagnosis not present

## 2015-03-22 DIAGNOSIS — E876 Hypokalemia: Secondary | ICD-10-CM | POA: Diagnosis not present

## 2015-03-22 DIAGNOSIS — N400 Enlarged prostate without lower urinary tract symptoms: Secondary | ICD-10-CM | POA: Diagnosis not present

## 2015-03-22 DIAGNOSIS — D649 Anemia, unspecified: Secondary | ICD-10-CM | POA: Diagnosis not present

## 2015-03-22 DIAGNOSIS — I4891 Unspecified atrial fibrillation: Secondary | ICD-10-CM | POA: Diagnosis not present

## 2015-03-22 DIAGNOSIS — I10 Essential (primary) hypertension: Secondary | ICD-10-CM | POA: Diagnosis not present

## 2015-03-22 DIAGNOSIS — N183 Chronic kidney disease, stage 3 (moderate): Secondary | ICD-10-CM | POA: Diagnosis not present

## 2015-03-22 DIAGNOSIS — I482 Chronic atrial fibrillation: Secondary | ICD-10-CM | POA: Diagnosis not present

## 2015-03-22 DIAGNOSIS — M6281 Muscle weakness (generalized): Secondary | ICD-10-CM | POA: Diagnosis not present

## 2015-03-22 DIAGNOSIS — I313 Pericardial effusion (noninflammatory): Secondary | ICD-10-CM | POA: Diagnosis not present

## 2015-03-22 DIAGNOSIS — I319 Disease of pericardium, unspecified: Secondary | ICD-10-CM | POA: Diagnosis not present

## 2015-03-22 DIAGNOSIS — I481 Persistent atrial fibrillation: Secondary | ICD-10-CM | POA: Diagnosis not present

## 2015-03-22 DIAGNOSIS — R262 Difficulty in walking, not elsewhere classified: Secondary | ICD-10-CM | POA: Diagnosis not present

## 2015-03-22 DIAGNOSIS — Z5189 Encounter for other specified aftercare: Secondary | ICD-10-CM | POA: Diagnosis not present

## 2015-03-22 DIAGNOSIS — I517 Cardiomegaly: Secondary | ICD-10-CM | POA: Diagnosis not present

## 2015-03-22 DIAGNOSIS — F419 Anxiety disorder, unspecified: Secondary | ICD-10-CM | POA: Diagnosis not present

## 2015-03-22 DIAGNOSIS — N401 Enlarged prostate with lower urinary tract symptoms: Secondary | ICD-10-CM | POA: Diagnosis not present

## 2015-03-22 DIAGNOSIS — I509 Heart failure, unspecified: Secondary | ICD-10-CM | POA: Diagnosis not present

## 2015-03-22 DIAGNOSIS — I5033 Acute on chronic diastolic (congestive) heart failure: Secondary | ICD-10-CM | POA: Diagnosis not present

## 2015-03-22 DIAGNOSIS — M79606 Pain in leg, unspecified: Secondary | ICD-10-CM | POA: Diagnosis not present

## 2015-03-22 DIAGNOSIS — K5900 Constipation, unspecified: Secondary | ICD-10-CM | POA: Diagnosis not present

## 2015-03-22 DIAGNOSIS — R5381 Other malaise: Secondary | ICD-10-CM | POA: Diagnosis not present

## 2015-03-22 DIAGNOSIS — Z79899 Other long term (current) drug therapy: Secondary | ICD-10-CM | POA: Diagnosis not present

## 2015-03-22 DIAGNOSIS — M109 Gout, unspecified: Secondary | ICD-10-CM | POA: Diagnosis not present

## 2015-03-22 DIAGNOSIS — N189 Chronic kidney disease, unspecified: Secondary | ICD-10-CM | POA: Diagnosis not present

## 2015-03-22 DIAGNOSIS — K5901 Slow transit constipation: Secondary | ICD-10-CM | POA: Diagnosis not present

## 2015-03-22 DIAGNOSIS — E785 Hyperlipidemia, unspecified: Secondary | ICD-10-CM | POA: Diagnosis not present

## 2015-03-22 LAB — CBC
HCT: 30 % — ABNORMAL LOW (ref 39.0–52.0)
HEMOGLOBIN: 9.7 g/dL — AB (ref 13.0–17.0)
MCH: 29.6 pg (ref 26.0–34.0)
MCHC: 32.3 g/dL (ref 30.0–36.0)
MCV: 91.5 fL (ref 78.0–100.0)
PLATELETS: 141 10*3/uL — AB (ref 150–400)
RBC: 3.28 MIL/uL — AB (ref 4.22–5.81)
RDW: 15.5 % (ref 11.5–15.5)
WBC: 9.4 10*3/uL (ref 4.0–10.5)

## 2015-03-22 LAB — HEPARIN LEVEL (UNFRACTIONATED)
Heparin Unfractionated: 0.39 IU/mL (ref 0.30–0.70)
Heparin Unfractionated: 0.45 IU/mL (ref 0.30–0.70)

## 2015-03-22 LAB — CBC AND DIFFERENTIAL: WBC: 9.4 10^3/mL

## 2015-03-22 LAB — BASIC METABOLIC PANEL
Anion gap: 10 (ref 5–15)
BUN: 27 mg/dL — AB (ref 4–21)
BUN: 27 mg/dL — AB (ref 6–20)
CHLORIDE: 97 mmol/L — AB (ref 101–111)
CO2: 31 mmol/L (ref 22–32)
CREATININE: 1.44 mg/dL — AB (ref 0.61–1.24)
Calcium: 8.4 mg/dL — ABNORMAL LOW (ref 8.9–10.3)
Creatinine: 1.4 mg/dL — AB (ref 0.6–1.3)
GFR calc Af Amer: 50 mL/min — ABNORMAL LOW (ref >60)
GFR calc non Af Amer: 43 mL/min — ABNORMAL LOW (ref >60)
GLUCOSE: 134 mg/dL — AB (ref 65–99)
Glucose: 134 mg/dL
POTASSIUM: 3.5 mmol/L (ref 3.5–5.1)
SODIUM: 138 mmol/L (ref 137–147)
SODIUM: 138 mmol/L (ref 135–145)

## 2015-03-22 MED ORDER — ALPRAZOLAM 0.5 MG PO TABS: 0.5000 mg | ORAL_TABLET | Freq: Three times a day (TID) | ORAL | Status: AC | PRN

## 2015-03-22 MED ORDER — FUROSEMIDE 40 MG PO TABS: 60.0000 mg | ORAL_TABLET | Freq: Two times a day (BID) | ORAL | Status: DC

## 2015-03-22 MED ORDER — APIXABAN 2.5 MG PO TABS
2.5000 mg | ORAL_TABLET | Freq: Two times a day (BID) | ORAL | Status: DC
  Administered 2015-03-22: 2.5 mg via ORAL
  Filled 2015-03-22: qty 1

## 2015-03-22 MED ORDER — OXYCODONE HCL 5 MG PO TABS: 5.0000 mg | ORAL_TABLET | Freq: Four times a day (QID) | ORAL | Status: DC | PRN

## 2015-03-22 MED ORDER — POLYETHYLENE GLYCOL 3350 17 G PO PACK
17.0000 g | PACK | Freq: Every day | ORAL | Status: DC | PRN
Start: 2015-03-22 — End: 2015-03-28

## 2015-03-22 MED ORDER — POTASSIUM CHLORIDE ER 20 MEQ PO TBCR: 1.0000 | EXTENDED_RELEASE_TABLET | Freq: Two times a day (BID) | ORAL | Status: DC

## 2015-03-22 MED ORDER — APIXABAN 2.5 MG PO TABS: 2.5000 mg | ORAL_TABLET | Freq: Two times a day (BID) | ORAL | Status: DC

## 2015-03-22 NOTE — Progress Notes (Signed)
ReDS Vest Discharge Study  Results of ReDS reading  Your patient is in the Unblinded arm of the Vest at Discharge study.  The ReDS reading is:   ( < 39)  = 32  Your patient is ok for discharge.   Thank You   The research team

## 2015-03-22 NOTE — Progress Notes (Signed)
Instructed by Ebony Hail the pharmacist to keep heparin gtt running until 6p when pt receive eliquis.  Karie Kirks, NB

## 2015-03-22 NOTE — Progress Notes (Signed)
[facility] PROGRESS NOTE   Luis Glenn [id number] DOB: [birth year] DOA: 03/12/2015 PCP: Binnie Rail, MD  HPI/Subjective: -feels well, no complaints, eating better  Assessment/Plan:  Large Pericardial effusion without Tamponade -2-D echo showed enlargement of previous pericardial effusion without tamponade physiology -Cards following, seen by CVTS too -s/p Pericardial drain 2/7, 1500cc removed, was having high output from drain then improved, drain removed 2/9 -ECHO 2/8 with decreased size of effusion -repeat ECHO today -cultures negative, cytology negative -DC depending on this, ? Resume Eliquis, also tells me that Eliquis will not be covered by his insurance and his Cardiologist was taking abt alternate agent,-Await Cards input regarding this  Acute on chronic diastolic congestive heart failure:  -was volume overloaded on exam, Last 2-D echo showed LVEF of 60-65%. -Follow I/Os, weights -was on IV lasix, now 40mg  Po BID -12L negative  Cellulitis vs Chronic venous stasis -with ACE wrap, has been on PO keflex -stop Abx today   Atrial fibrillation on Eliquis: Appears rate controlled CHA2DS2-VASc score 4 -Eliquis on hold due to above procedure -Now on IV Heparin, resume NOAC today, see discussion above on coverage   Chronic kidney disease stage III:  -stable, monitor  Essential Hypertension - Continue terazosin  Hyperkalemia:  -resolved, corrected  Hyperlipidemia - continue simvastatin  BPH -Continue Flomax  Code Status: Full Code Family Communication: none at bedside Disposition Plan: ? SNF later today Consultants:  None  Procedures:  None  Antibiotics:  Clindamycin (indicate start date, and stop date if known)   Objective: Filed Vitals:   03/22/15 0026 03/22/15 0532  BP: 139/56 143/63  Pulse: 79 74  Temp: 99.2 F (37.3 C) 99.3 F (37.4 C)  Resp: 20 20    Intake/Output Summary (Last 24 hours) at 03/22/15 0837 Last data filed  at 03/22/15 0816  Gross per 24 hour  Intake 1109.1 ml  Output    808 ml  Net  301.1 ml   Filed Weights   03/19/15 0346 03/21/15 1000 03/22/15 0532  Weight: 82.464 kg (181 lb 12.8 oz) 79.6 kg (175 lb 7.8 oz) 78.926 kg (174 lb)    Exam: General: Alert and awake, oriented x3, not in any acute distress. HEENT: anicteric sclera, pupils reactive to light and accommodation, EOMI CVS: S1-S2 clear, no murmur rubs or gallops Chest: clear to auscultation bilaterally, no wheezing, rales or rhonchi, pericardial drain noted Abdomen: soft nontender, nondistended, normal bowel sounds, no organomegaly Extremities: no cyanosis, clubbing or edema noted bilaterally Neuro: Cranial nerves II-XII intact, no focal neurological deficits  Data Reviewed: Basic Metabolic Panel:  Recent Labs Lab 03/16/15 0320 03/17/15 0307 03/18/15 0325 03/20/15 0813 03/22/15 0033  NA 142 145 142 144 138  K 3.8 3.7 3.5 3.5 3.5  CL 105 104 101 101 97*  CO2 26 29 30 31 31   GLUCOSE 98 97 98 93 134*  BUN 27* 26* 26* 23* 27*  CREATININE 1.55* 1.54* 1.58* 1.45* 1.44*  CALCIUM 8.9 8.9 8.9 8.8* 8.4*  MG 2.1  --   --   --   --    Liver Function Tests: No results for input(s): AST, ALT, ALKPHOS, BILITOT, PROT, ALBUMIN in the last 168 hours.  Recent Labs Lab 03/19/15 1450  AMYLASE 19*   No results for input(s): AMMONIA in the last 168 hours. CBC:  Recent Labs Lab 03/20/15 0813 03/21/15 0325 03/22/15 0033  WBC 15.4* 9.8 9.4  HGB 11.1* 10.2* 9.7*  HCT 34.7* 32.7* 30.0*  MCV 93.8 91.9 91.5  PLT  141* 132* 141*   Cardiac Enzymes: No results for input(s): CKTOTAL, CKMB, CKMBINDEX, TROPONINI in the last 168 hours. BNP (last 3 results)  Recent Labs  03/12/15 1934 03/15/15 1825  BNP 1212.7* 1484.5*    ProBNP (last 3 results)  Recent Labs  03/11/15 1725  PROBNP 839.0*    CBG: No results for input(s): GLUCAP in the last 168 hours.  Micro Recent Results (from the past 240 hour(s))  Culture, blood  (Routine X 2) w Reflex to ID Panel     Status: None   Collection Time: 03/12/15  7:34 PM  Result Value Ref Range Status   Specimen Description BLOOD RIGHT ARM  Final   Special Requests BOTTLES DRAWN AEROBIC AND ANAEROBIC 5CC  Final   Culture NO GROWTH 5 DAYS  Final   Report Status 03/17/2015 FINAL  Final  Culture, blood (Routine X 2) w Reflex to ID Panel     Status: None   Collection Time: 03/12/15  7:34 PM  Result Value Ref Range Status   Specimen Description BLOOD LEFT ARM  Final   Special Requests BOTTLES DRAWN AEROBIC AND ANAEROBIC 5CC  Final   Culture NO GROWTH 5 DAYS  Final   Report Status 03/17/2015 FINAL  Final  Culture, body fluid-bottle     Status: None (Preliminary result)   Collection Time: 03/19/15  2:50 PM  Result Value Ref Range Status   Specimen Description FLUID PERICARDIAL  Final   Special Requests NONE  Final   Culture NO GROWTH 2 DAYS  Final   Report Status PENDING  Incomplete  Gram stain     Status: None   Collection Time: 03/19/15  2:50 PM  Result Value Ref Range Status   Specimen Description FLUID PERICARDIAL  Final   Special Requests NONE  Final   Gram Stain   Final    RARE WBC PRESENT, PREDOMINANTLY MONONUCLEAR NO ORGANISMS SEEN    Report Status 03/19/2015 FINAL  Final  MRSA PCR Screening     Status: None   Collection Time: 03/21/15 10:21 AM  Result Value Ref Range Status   MRSA by PCR NEGATIVE NEGATIVE Final    Comment:        The GeneXpert MRSA Assay (FDA approved for NASAL specimens only), is one component of a comprehensive MRSA colonization surveillance program. It is not intended to diagnose MRSA infection nor to guide or monitor treatment for MRSA infections.      Studies: No results found.  Scheduled Meds: . allopurinol  300 mg Oral Daily  . antiseptic oral rinse  7 mL Mouth Rinse BID  . feeding supplement (ENSURE ENLIVE)  237 mL Oral BID PC  . fluticasone  2 spray Each Nare Daily  . furosemide  60 mg Oral BID  . polyethylene  glycol  17 g Oral Daily  . simvastatin  40 mg Oral q1800  . sodium chloride flush  3 mL Intravenous Q12H  . sodium chloride flush  3 mL Intravenous Q12H  . spironolactone  25 mg Oral BID  . tamsulosin  0.4 mg Oral Daily   Continuous Infusions: . heparin 900 Units/hr (03/21/15 1606)       Time spent: 35 minutes    Branna Cortina  [facility] Pager [phone] If 7PM-7AM, please contact night-coverage at [email or web address], password [facility] 03/22/2015, 8:37 AM  LOS: 10 days

## 2015-03-22 NOTE — Progress Notes (Signed)
ANTICOAGULATION CONSULT NOTE - Follow Up Consult  Pharmacy Consult for heparin Indication: atrial fibrillation  Labs:  Recent Labs  03/19/15 0751  03/20/15 0813  03/21/15 0325 03/21/15 1213 03/22/15 0033 03/22/15 0034  HGB  --   < > 11.1*  --  10.2*  --  9.7*  --   HCT  --   --  34.7*  --  32.7*  --  30.0*  --   PLT  --   --  141*  --  132*  --  141*  --   LABPROT 16.9*  --   --   --   --   --   --   --   INR 1.36  --   --   --   --   --   --   --   HEPARINUNFRC  --   --   --   < > 0.73* 0.44  --  0.45  CREATININE  --   --  1.45*  --   --   --   --   --   < > = values in this interval not displayed.   Assessment: 82yo male with PMH of afib on eliquis PTA. Eliquis on hold and IV heparin while awaiting decision on pericardial window. Heparin restarted post drain removal 2/9. Heparin level therapeutic on 900 units/hr. No bleeding noted although Hgb down a bit. Plt stable.  Goal of Therapy:  Heparin level 0.3-0.7 units/ml   Plan:  -Continue IV heparin @ 900units/hr -F/u daily heparin level and CBC  Sherlon Handing, PharmD, BCPS Clinical pharmacist, pager [phone] 03/22/2015 12:51 AM

## 2015-03-22 NOTE — Progress Notes (Addendum)
[place] for heparin  Indication: atrial fibrillation  Allergies  Allergen Reactions  . Penicillins Swelling and Rash    Has patient had a PCN reaction causing immediate rash, facial/tongue/throat swelling, SOB or lightheadedness with hypotension: NO Has patient had a PCN reaction causing severe rash involving mucus membranes or skin necrosis:NO Has patient had a PCN reaction that required hospitalization No Has patient had a PCN reaction occurring within the last 10 years: NO If all of the above answers are "NO", then may proceed with Cephalosporin use.    Patient Measurements: Height: 6\' 2"  (188 cm) Weight: 174 lb (78.926 kg) IBW/kg (Calculated) : 82.2 Heparin Dosing Weight: 79 kg  Vital Signs: Temp: 99.3 F (37.4 C) (02/10 0532) Temp Source: Oral (02/10 0532) BP: 143/63 mmHg (02/10 0532) Pulse Rate: 74 (02/10 0532)  Labs:  Recent Labs  03/20/15 0813  03/21/15 0325 03/21/15 1213 03/22/15 0033 03/22/15 0034 03/22/15 0600  HGB 11.1*  --  10.2*  --  9.7*  --   --   HCT 34.7*  --  32.7*  --  30.0*  --   --   PLT 141*  --  132*  --  141*  --   --   HEPARINUNFRC  --   < > 0.73* 0.44  --  0.45 0.39  CREATININE 1.45*  --   --   --  1.44*  --   --   < > = values in this interval not displayed.  Estimated Creatinine Clearance: 41.9 mL/min (by C-G formula based on Cr of 1.44).   Medications:  Prescriptions prior to admission  Medication Sig Dispense Refill Last Dose  . acetaminophen (TYLENOL) 500 MG tablet Take 500 mg by mouth 4 (four) times daily - after meals and at bedtime. Take 4 times every day per patient   03/12/2015 at Unknown time  . allopurinol (ZYLOPRIM) 300 MG tablet Take 1 tablet (300 mg total) by mouth daily. 30 tablet 6 03/11/2015 at Unknown time  . ALPRAZolam (XANAX) 0.5 MG tablet Take 1 tablet (0.5 mg total) by mouth 3 (three) times daily as needed for anxiety. 90 tablet 5 03/12/2015 at Unknown time  .  amLODipine-benazepril (LOTREL) 10-20 MG per capsule TAKE 1 CAPSULE BY MOUTH DAILY 90 capsule 3 03/11/2015 at Unknown time  . apixaban (ELIQUIS) 2.5 MG TABS tablet Take 1 tablet (2.5 mg total) by mouth 2 (two) times daily. 60 tablet 9 03/12/2015 at Unknown time  . fluticasone (FLONASE) 50 MCG/ACT nasal spray Place 2 sprays into both nostrils daily. 16 g 6 03/11/2015 at Unknown time  . furosemide (LASIX) 40 MG tablet Take 1 tablet (40 mg total) by mouth 2 (two) times daily. 180 tablet 3 03/12/2015 at Unknown time  . oxyCODONE (OXY IR/ROXICODONE) 5 MG immediate release tablet Take 1 tablet (5 mg total) by mouth 2 (two) times daily as needed for severe pain. 60 tablet 0 03/11/2015 at Unknown time  . Potassium Chloride ER 20 MEQ TBCR TAKE 1 TABLET BY MOUTH DAILY 30 tablet 11 03/11/2015 at Unknown time  . simvastatin (ZOCOR) 40 MG tablet Take 1 tablet (40 mg total) by mouth daily at 6 PM. 90 tablet 1 03/11/2015 at Unknown time  . spironolactone (ALDACTONE) 25 MG tablet Take 1 tablet (25 mg total) by mouth daily. 30 tablet 3 03/11/2015 at Unknown time  . tamsulosin (FLOMAX) 0.4 MG CAPS capsule TAKE ONE CAPSULE BY MOUTH DAILY 90 capsule 3 03/11/2015 at Unknown time  . terazosin (  HYTRIN) 10 MG capsule TAKE 1 CAPSULE (10 MG TOTAL) BY MOUTH DAILY. 90 capsule 1 03/11/2015 at Unknown time  . FLUoxetine (PROZAC) 10 MG capsule Take 1 capsule (10 mg total) by mouth daily. (Patient not taking: Reported on 03/12/2015) 90 capsule 3 Not Taking at Unknown time    Assessment: 82 yo male on Eliquis PTA. Eliquis has been held while awaiting decision of a potential pericardial window. He has been transitioned to heparin gtt and remains therapeutic. CBC stable.   Goal of Therapy:  Heparin level 0.3-0.7 units/ml Monitor platelets by anticoagulation protocol: Yes    Plan:  -Continue heparin at 900 units/hr -Daily HL, CBC -Monitor s/sx bleeding -F/u plans for resuming Eliquis   Qianna Clagett, Jake Church 03/22/2015,7:59  AM   Addendum -Stop heparin at 1800, give Eliquis at the same time -Resume Eliquis 2.5 mg po bid -Monitor s/sx bleeding   Harvel Quale  03/22/2015 12:58 PM]

## 2015-03-22 NOTE — Progress Notes (Signed)
Physical Therapy Treatment Patient Details Name: Luis Glenn MRN: [id number] DOB: [birth year] Today's Date: 03/22/2015    History of Present Illness 82 year old male with a past medical history significant for HTN, PVD, HLD, diabetes mellitus type 2, depression, BPH, pericardial effusion last echo in 06/2013; who presents with complaints of leg swelling. Patient notes a history of chronic lower extremity edema.    PT Comments    Patient making gains with mobility. Patient reporting that he feels a little tired and declined further ambulation. PT to follow and progress as tolerated.   Follow Up Recommendations  SNF;Supervision/Assistance - 24 hour     Equipment Recommendations  None recommended by PT    Recommendations for Other Services       Precautions / Restrictions Precautions Precautions: Fall Restrictions Weight Bearing Restrictions: No    Mobility  Bed Mobility Overal bed mobility: Needs Assistance Bed Mobility: Supine to Sit;Sit to Supine Rolling: Supervision (assist with rail. )   Supine to sit: Supervision Sit to supine: Supervision      Transfers Overall transfer level: Needs assistance Equipment used:  (patient declined using rw) Transfers: Sit to/from Stand Sit to Stand: Min assist         General transfer comment: cues for scooting to edge of bed and forward bend.   Ambulation/Gait Ambulation/Gait assistance: Min guard Ambulation Distance (Feet): 5 Feet Assistive device: None Gait Pattern/deviations: Step-through pattern;Decreased step length - right;Decreased step length - left Gait velocity: decreased   General Gait Details: patient requests not ambulating further due to feeling tired right now.    Stairs            Wheelchair Mobility    Modified Rankin (Stroke Patients Only)       Balance Overall balance assessment: Needs assistance Sitting-balance support: No upper extremity supported Sitting balance-Leahy Scale: Good     Standing balance support: No upper extremity supported Standing balance-Leahy Scale: Fair Standing balance comment: mild instability with dynamic stability.                     Cognition Arousal/Alertness: Awake/alert Behavior During Therapy: WFL for tasks assessed/performed Overall Cognitive Status: Within Functional Limits for tasks assessed                      Exercises      General Comments        Pertinent Vitals/Pain Pain Assessment: No/denies pain    Home Living                      Prior Function            PT Goals (current goals can now be found in the care plan section) Acute Rehab PT Goals Patient Stated Goal: to go home PT Goal Formulation: With patient Time For Goal Achievement: 03/29/15 Potential to Achieve Goals: Good Progress towards PT goals: Progressing toward goals    Frequency  Min 3X/week    PT Plan Current plan remains appropriate    Co-evaluation             End of Session Equipment Utilized During Treatment: Gait belt Activity Tolerance: Patient tolerated treatment well Patient left: in bed;with call bell/phone within reach     Time: 1215-1232 PT Time Calculation (min) (ACUTE ONLY): 17 min  Charges:  $Therapeutic Activity: 8-22 mins                    G  Codes:      Cassell Clement, PT, CSCS Pager [phone] Office [phone]  03/22/2015, 1:02 PM

## 2015-03-22 NOTE — Telephone Encounter (Signed)
Pt was on TCM list admitted for chronic diastolic CHF. Was D/C 03/21/15 to [facility].Will be f/u with Dr. Manley Mason 7-10 days after SN....Johny Chess

## 2015-03-22 NOTE — Discharge Instructions (Signed)
Do the following things EVERYDAY: °1) Weigh yourself in the morning before breakfast. Write it down and keep it in a log. °2) Take your medicines as prescribed °3) Eat low salt foods--Limit salt (sodium) to 2000 mg per day.  °4) Stay as active as you can everyday °5) Limit all fluids for the day to less than 2 liters °

## 2015-03-22 NOTE — Progress Notes (Signed)
Patient Name: Luis Glenn Date of Encounter: 03/22/2015   SUBJECTIVE  Feeling well. No chest pain, sob or palpitations. Plan to discharge to [facility].   CURRENT MEDS . allopurinol  300 mg Oral Daily  . antiseptic oral rinse  7 mL Mouth Rinse BID  . feeding supplement (ENSURE ENLIVE)  237 mL Oral BID PC  . fluticasone  2 spray Each Nare Daily  . furosemide  60 mg Oral BID  . polyethylene glycol  17 g Oral Daily  . simvastatin  40 mg Oral q1800  . sodium chloride flush  3 mL Intravenous Q12H  . sodium chloride flush  3 mL Intravenous Q12H  . spironolactone  25 mg Oral BID  . tamsulosin  0.4 mg Oral Daily    OBJECTIVE  Filed Vitals:   03/21/15 2037 03/22/15 0026 03/22/15 0532 03/22/15 0901  BP: 144/50 139/56 143/63 128/50  Pulse: 77 79 74 69  Temp: 98.3 F (36.8 C) 99.2 F (37.3 C) 99.3 F (37.4 C)   TempSrc: Oral Oral Oral   Resp: 18 20 20    Height:      Weight:   174 lb (78.926 kg)   SpO2: 93% 94% 93%     Intake/Output Summary (Last 24 hours) at 03/22/15 1023 Last data filed at 03/22/15 0816  Gross per 24 hour  Intake  737.1 ml  Output    808 ml  Net  -70.9 ml   Filed Weights   03/19/15 0346 03/21/15 1000 03/22/15 0532  Weight: 181 lb 12.8 oz (82.464 kg) 175 lb 7.8 oz (79.6 kg) 174 lb (78.926 kg)    PHYSICAL EXAM  General: Pleasant, NAD. Neuro: Alert and oriented X 3. Moves all extremities spontaneously. Psych: Normal affect. HEENT:  Normal  Neck: Supple without bruits or JVD. Lungs:  Resp regular and unlabored. R sided faint rales.  Heart: RRR no s3, s4, or murmurs. Abdomen: Soft, non-tender, non-distended, BS + x 4.  Extremities: No clubbing, cyanosis. BL LE wrapped and has 1+ edema. DP 2+ and equal bilaterally.  Accessory Clinical Findings  CBC  Recent Labs  03/21/15 0325 03/22/15 0033  WBC 9.8 9.4  HGB 10.2* 9.7*  HCT 32.7* 30.0*  MCV 91.9 91.5  PLT 132* Q000111Q*   Basic Metabolic Panel  Recent Labs  03/20/15 0813  03/22/15 0033  NA 144 138  K 3.5 3.5  CL 101 97*  CO2 31 31  GLUCOSE 93 134*  BUN 23* 27*  CREATININE 1.45* 1.44*  CALCIUM 8.8* 8.4*   Liver Function Tests No results for input(s): AST, ALT, ALKPHOS, BILITOT, PROT, ALBUMIN in the last 72 hours.  Recent Labs  03/19/15 1450  AMYLASE 19*    TELE  Ventricular trigemny  Radiology/Studies  Dg Chest Port 1 View  03/12/2015  CLINICAL DATA:  Shortness of breath and known pericardial effusion. EXAM: PORTABLE CHEST 1 VIEW COMPARISON:  CT 07/11/2014 FINDINGS: Single view of the chest again demonstrates enlargement of the cardiac silhouette. Findings are compatible with a large amount of pericardial fluid. In addition, there are prominent central vascular structures and concern for some pulmonary edema. Limited evaluation of the left lung base due to the enlarged cardiac silhouette. IMPRESSION: Enlarged cardiac silhouette related to a large pericardial effusion. Evidence for vascular congestion or mild pulmonary edema. Electronically Signed   By: Markus Daft M.D.   On: 03/12/2015 19:06    ASSESSMENT AND PLAN   1. Large pericardial effusion without tamponade since 2015, declined pericardial window  in the past, failed ibuprofen/colchichine therapy. - - S/p pericardiocentesis 2/7 with 1500cc fluid removal.  - Echo 03/20/15 showed decreased size of the effusion. Pulled drain yesterday. Pending repeat echo today. Cytology and culture negative.   2. Acute on chronic diastolic HF, improved with diuresis  - Net I/O negative 12.3L however +290ml yesterday.  Total weight down 32lb (206-->174). Increased to lasix po 60mg  yesterday. On spironolactone 25mg .  - ?Acuurate output. Scr stable.    3. Paroxysmal atrial fibrillation CHADSvasc 4 - holding eliquis for now in case he needs pericardial window. On heparin for bridging yesterday. Continue this for now. States his insurance switched formulary so may change from eliquis to a different medication, will  follow up with Dr. Acie Fredrickson regarding this.  - tele showed controlled rate with ventricular trigemny  4. CKD III  - stable   5. Leg cellulitis VS venous stasis - on abx per primary team - edema improved on ACE wrap  6. HTN - Stable on current regimen  7. Anemia - Hemoglobin stable. Follow closely. No blood in stool or melena.   Jarrett Soho PA-C Pager [phone]  Patient seen, examined. Available data reviewed. Agree with findings, assessment, and plan as outlined by Robbie Lis, PA-C. The patient is doing well. Exam reveals a pleasant elderly male in no distress. His lung fields are clear. Heart is regular rate and rhythm without murmur. There is trace edema in his legs. I have personally reviewed his follow-up echo images and the size of his pericardial effusion remains stable. It is dramatically improved after pericardiocentesis. I would recommend a repeat limited echocardiogram as an outpatient in one to 2 weeks. I will stop heparin today and start him back on Eliquis. He is going to need a change in his oral anticoagulant for insurance reasons and he will work this out with Dr. Acie Fredrickson. He has just felt his last bottle of Eliquis. I would continue him on his current furosemide dose of 60 mg twice daily. Will arrange his cardiology follow-up.  Sherren Mocha, M.D. 03/22/2015 12:05 PM

## 2015-03-22 NOTE — Progress Notes (Signed)
Report called to [place] at [facility]  To to be d/c today.  Brooke Steinhilber, Therapist, sports.

## 2015-03-22 NOTE — Clinical Social Work Placement (Signed)
   CLINICAL SOCIAL WORK PLACEMENT  NOTE  Date:  03/22/2015  Patient Details  Name: Luis Glenn MRN: [id number] Date of Birth: 1927-12-02  Clinical Social Work is seeking post-discharge placement for this patient at the [place] level of care (*CSW will initial, date and re-position this form in  chart as items are completed):  Yes   Patient/family provided with [place] Work Department's list of facilities offering this level of care within the geographic area requested by the patient (or if unable, by the patient's family).  Yes   Patient/family informed of their freedom to choose among providers that offer the needed level of care, that participate in Medicare, Medicaid or managed care program needed by the patient, have an available bed and are willing to accept the patient.  Yes   Patient/family informed of [facility]'s ownership interest in [facility] and [facility], as well as of the fact that they are under no obligation to receive care at these facilities.  PASRR submitted to EDS on 03/14/15     PASRR number received on 03/14/15     Existing PASRR number confirmed on       FL2 transmitted to all facilities in geographic area requested by pt/family on 03/14/15     FL2 transmitted to all facilities within larger geographic area on       Patient informed that his/her managed care company has contracts with or will negotiate with certain facilities, including the following:        Yes   Patient/family informed of bed offers received.  Patient chooses bed at [facility]     Physician recommends and patient chooses bed at      Patient to be transferred to [facility] on 03/22/15.  Patient to be transferred to facility by Ambulance [facility])     Patient family notified on 03/22/15 of transfer.  Name of family member notified:  Patient states he called his daughter [name].  He is alert and oriented.  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        PHYSICIAN Please prepare priority discharge summary, including medications, Please sign FL2, Please prepare prescriptions     Additional Comment:    _______________________________________________ Williemae Area, LCSW [phone]

## 2015-03-22 NOTE — Progress Notes (Signed)
Heart Failure Navigator Consult Note  Presentation: Luis Glenn is a 82 year old male with a past medical history significant for HTN, PVD, HLD, diabetes mellitus type 2, depression, BPH, pericardial effusion last echo in 06/2013; who presents with complaints of leg swelling. Patient notes a history of chronic lower extremity edema. In the last week to week and half he's complained of more leg swelling than usual. His left leg had a blister that had popped approximately 3-4 days ago and left a big hole in his leg. He initially thought he had urinated on himself because there was so much fluid. Subsequently thereafter developed increasing redness of his left leg. Patient denies any fever, chills. He does not regularly monitor his weight and due to instability on his feet. He notes increasing shortness of breath that has been worse than normal. He was seen by his primary care provider who recommended him to come to the emergency department after talks with patient's cardiologist as his creatinine had slowly crept up. Cardiologist reports a baseline creatinine approximately 1.4..   Past Medical History  Diagnosis Date  . GOUT   . PERIPHERAL VASCULAR DISEASE   . DIVERTICULOSIS, COLON   . BENIGN PROSTATIC HYPERTROPHY   . DEPRESSION   . DIABETES MELLITUS, TYPE II   . GERD   . HYPERLIPIDEMIA   . HYPERTENSION   . CATARACTS   . OSTEOARTHRITIS   . ANXIETY   . ALLERGIC RHINITIS   . Pericardial effusion     Social History   Social History  . Marital Status: Widowed    Spouse Name: N/A  . Number of Children: N/A  . Years of Education: N/A   Occupational History  . Retired    Social History Main Topics  . Smoking status: Former Smoker -- 3.00 packs/day for 40 years    Types: Cigarettes    Quit date: 02/09/1989  . Smokeless tobacco: None  . Alcohol Use: Yes     Comment: occassional  . Drug Use: No  . Sexual Activity: Not Asked   Other Topics Concern  . None   Social History  Narrative   Widowed - wife Luis Glenn passed 07/2013    ECHO:Study Conclusions-03/22/15  - Left ventricle: The cavity size was normal. There was mild concentric hypertrophy. Systolic function was normal. The estimated ejection fraction was in the range of 50% to 55%. The study is not technically sufficient to allow evaluation of LV diastolic function. - Aortic valve: There was moderate regurgitation. - Mitral valve: Calcified annulus. Mildly thickened leaflets . - Left atrium: The atrium was moderately dilated. - Right ventricle: The cavity size was moderately dilated. Systolic function was moderately reduced. - Right atrium: The atrium was moderately dilated. - Atrial septum: No defect or patent foramen ovale was identified. - Tricuspid valve: There was mild-moderate regurgitation directed centrally. - Pulmonary arteries: Systolic pressure was moderately increased. PA peak pressure: 52 mm Hg (S). - Pericardium, extracardiac: A moderate, free-flowing pericardial effusion was identified posterior to the heart and circumferential to the heart. The fluid had no internal echoes.There was no evidence of hemodynamic compromise. There was a left pleural effusion.  Impressions:  - Incessant PVCs make assessment of LV function and wall motion challenging.  Transthoracic echocardiography. M-mode, limited 2D, limited spectral Doppler, and color Doppler. Birthdate: Patient birthdate: Dec 02, 1927. Age: Patient is 82 yr old. Sex: Gender: male.  BMI: 22.3 kg/m^2. Blood pressure:   143/63 Patient status: Inpatient. Study date: Study date: 03/22/2015. Study time: 10:41 AM. Location: Bedside.  BNP    Component Value Date/Time   BNP 1484.5* 03/15/2015 1825    ProBNP    Component Value Date/Time   PROBNP 839.0* 03/11/2015 1725     Education Assessment and Provision:  Detailed education and instructions provided on heart failure disease management  including the following:  Signs and symptoms of Heart Failure When to call the physician Importance of daily weights Low sodium diet Fluid restriction Medication management Anticipated future follow-up appointments  Patient education given on each of the above topics.  Patient acknowledges understanding and acceptance of all instructions.  I spoke with Luis Glenn during his ReDs Vest fitting.  He tells me that he plans to discharge to [facility] for [place].  He says that he has received HF education before.  He has a scale yet was not weighing before admission due to being unsteady while standing.  I reviewed the importance of daily weights and how they relate to the signs and symptoms of HF.  I also reviewed a low sodium diet and high sodium foods to avoid.  He told me that I was "taking away everything" he currently eats.  He denies any issues with getting or taking medications--except for Eliquis which is an Database administrator issue".  He has plans to "switch" as an outpatient to something that is more affordable through his insurance.  He will follow with [facility].  Education Materials:  "Living Better With Heart Failure" Booklet, Daily Weight Tracker Tool    High Risk Criteria for Readmission and/or Poor Patient Outcomes:   EF <30%- 50-55%   2 or more admissions in 6 months- No  Difficult social situation- No  Demonstrates medication noncompliance- No    Barriers of Care:  Knowledge, compliance and ? ability to provide self care after discharge from [place].  Discharge Planning: Plans to discharge to [facility]

## 2015-03-22 NOTE — Progress Notes (Signed)
Pt transferred to: [facility]  Anticipated date of transfer: 03/22/15 Transported by: Ambulance ([facility])  Time Tentatively Scheduled for: 3:00 PM Family nottified: Daughter Juliette notified by patient. Report # given to nursing to call report and DC summary sent to facility for review.  Patient was very relieved that d/c plan is in place and asked questions about the facility and what he would need there (personal items etc). He has a very positive attitude and verbalized appreciation to CSW about rehab arrangements.  No further needs identified.   CSW signing off.  Kendell Bane, LCSW [phone]

## 2015-03-22 NOTE — Progress Notes (Signed)
Orthopedic Tech Progress Note Patient Details:  Luis Glenn 1927-08-02 [id number]  Ortho Devices Type of Ortho Device: Louretta Parma boot Ortho Device/Splint Location: bilateral Ortho Device/Splint Interventions: Application   Laelyn Blumenthal 03/22/2015, 10:08 AM

## 2015-03-22 NOTE — Discharge Summary (Signed)
Physician Discharge Summary  Luis Glenn [id number] DOB: [birth year] DOA: 03/12/2015  PCP: Binnie Rail, MD  Admit date: 03/12/2015 Discharge date: 03/22/2015  Time spent: 45 minutes  Recommendations for Outpatient Follow-up:  1. Dr.Nahser in 7-10days, Office to call pt with appt 2. Repeat ECHO in 1 -2weeks per [facility] 3. Please check Bmet at FU   Discharge Diagnoses:    Acute on chronic diastolic CHF   Large Pericardial effusion   Essential hypertension   BPH (benign prostatic hyperplasia)   Atrial fibrillation (HCC)   Anemia   Cellulitis of left leg   Chronic kidney disease, stage III (moderate)   Dependent edema   Chronic anticoagulation   Acute on chronic diastolic (congestive) heart failure (HCC)   Pericardial effusion without cardiac tamponade   Cellulitis of left lower extremity   Discharge Condition: stable Diet recommendation: low sodium, heart healthy  Filed Weights   03/19/15 0346 03/21/15 1000 03/22/15 0532  Weight: 82.464 kg (181 lb 12.8 oz) 79.6 kg (175 lb 7.8 oz) 78.926 kg (174 lb)    History of present illness:  Chief Complaint: Lower leg swelling. HPI: [name] is a 82 year old male with a past medical history significant for HTN, PVD, HLD, diabetes mellitus type 2, depression, BPH, pericardial effusion last echo in 06/2013; who presented with complaints of leg swelling. Patient noted a history of chronic lower extremity edema. In the last week to week and half he's complained of more leg swelling than usual. His left leg had a blister that had popped approximately 3-4 days ago and left a big hole in his leg. He initially thought he had urinated on himself because there was so much fluid. Subsequently thereafter developed increasing redness of his left leg. Marland Kitchen He does not regularly monitor his weight and due to instability on his feet. He noted increasing shortness of breath that has been worse than normal  Hospital Course:  Large Pericardial  effusion without Tamponade -2-D echo showed enlargement of previous pericardial effusion without tamponade physiology -Cards and CVTS consulted -underwent Pericardiocentesis on 2/7, 1500cc removed, and then noted to have with high output from drain -then drain output decreased and this drain was removed yesterday am by Cardiology -Fluid was transudative, cultures negative, cytology negative, likely from CHF only, no other etiology identified at this time -Repeat FU ECHO today improved per Cards, d/w Dr.Cooper, felt that pt was stable for discharge on higher dose of Lasix and would need a FU with Dr.Nahser in 1-2weeks with ECHO and their office would arrange all that  Acute on chronic diastolic congestive heart failure:  -volume overloaded on admission, Last 2-D echo showed LVEF of 60-65%. -aggressively diuresed with Iv lasix -12.9L negative this admission -now on Po lasix at 60mg  Po BID with KCL -12l negative  Cellulitis vs Chronic venous stasis -with ACE wrap, on PO keflex -will continue Abx for few more days  Atrial fibrillation on Eliquis: Appears rate controlled CHA2DS2-VASc score 4 -Eliquis was on hold for above procedure, can be resumed at this time per cards, also trying to find out if alternate NOAC will be covered by his Insurance since having issues getting coverage for Eliquis at this time has 30day supply, will need to be addressed at FU  Chronic kidney disease stage III:  -stable, monitor  Essential Hypertension - Continue terazosin  Hyperkalemia:  -resolved, corrected  Hyperlipidemia - continue simvastatin  BPH -Continue Flomax  Procedures: Pericardiocentesis by Dr.Cooper 03/19/15 Pericardial drain removed 03/21/15  Consultations:  Cardiology Dr.Cooper  CVTS  Discharge Exam: Filed Vitals:   03/22/15 0901 03/22/15 1206  BP: 128/50 143/76  Pulse: 69 77  Temp:  97.7 F (36.5 C)  Resp:  18    General: AAOx3 Cardiovascular: S1S2/RRR Respiratory:  CTAB  Discharge Instructions   Discharge Instructions    Diet - low sodium heart healthy    Complete by:  As directed      Increase activity slowly    Complete by:  As directed           Current Discharge Medication List    START taking these medications   Details  polyethylene glycol (MIRALAX / GLYCOLAX) packet Take 17 g by mouth daily as needed. Refills: 0      CONTINUE these medications which have CHANGED   Details  ALPRAZolam (XANAX) 0.5 MG tablet Take 1 tablet (0.5 mg total) by mouth 3 (three) times daily as needed for anxiety. Qty: 20 tablet, Refills: 0    furosemide (LASIX) 40 MG tablet Take 1.5 tablets (60 mg total) by mouth 2 (two) times daily. Refills: 0    oxyCODONE (OXY IR/ROXICODONE) 5 MG immediate release tablet Take 1 tablet (5 mg total) by mouth every 6 (six) hours as needed for severe pain. Qty: 20 tablet, Refills: 0    Potassium Chloride ER 20 MEQ TBCR Take 1 tablet by mouth 2 (two) times daily.      CONTINUE these medications which have NOT CHANGED   Details  acetaminophen (TYLENOL) 500 MG tablet Take 500 mg by mouth 4 (four) times daily - after meals and at bedtime. Take 4 times every day per patient    allopurinol (ZYLOPRIM) 300 MG tablet Take 1 tablet (300 mg total) by mouth daily. Qty: 30 tablet, Refills: 6    apixaban (ELIQUIS) 2.5 MG TABS tablet Take 1 tablet (2.5 mg total) by mouth 2 (two) times daily. Qty: 60 tablet, Refills: 9   Associated Diagnoses: Persistent atrial fibrillation (HCC)    fluticasone (FLONASE) 50 MCG/ACT nasal spray Place 2 sprays into both nostrils daily. Qty: 16 g, Refills: 6    simvastatin (ZOCOR) 40 MG tablet Take 1 tablet (40 mg total) by mouth daily at 6 PM. Qty: 90 tablet, Refills: 1    spironolactone (ALDACTONE) 25 MG tablet Take 1 tablet (25 mg total) by mouth daily. Qty: 30 tablet, Refills: 3    tamsulosin (FLOMAX) 0.4 MG CAPS capsule TAKE ONE CAPSULE BY MOUTH DAILY Qty: 90 capsule, Refills: 3      STOP  taking these medications     amLODipine-benazepril (LOTREL) 10-20 MG per capsule      terazosin (HYTRIN) 10 MG capsule      FLUoxetine (PROZAC) 10 MG capsule        Allergies  Allergen Reactions  . Penicillins Swelling and Rash    Has patient had a PCN reaction causing immediate rash, facial/tongue/throat swelling, SOB or lightheadedness with hypotension: NO Has patient had a PCN reaction causing severe rash involving mucus membranes or skin necrosis:NO Has patient had a PCN reaction that required hospitalization No Has patient had a PCN reaction occurring within the last 10 years: NO If all of the above answers are "NO", then may proceed with Cephalosporin use.   Follow-up Information    Follow up with [facility] .   Specialty:  Skilled Nursing Facility   Contact information:   [place] [phone]       The results of significant  diagnostics from this hospitalization (including imaging, microbiology, ancillary and laboratory) are listed below for reference.    Significant Diagnostic Studies: Dg Chest Port 1 View  03/12/2015  CLINICAL DATA:  Shortness of breath and known pericardial effusion. EXAM: PORTABLE CHEST 1 VIEW COMPARISON:  CT 07/11/2014 FINDINGS: Single view of the chest again demonstrates enlargement of the cardiac silhouette. Findings are compatible with a large amount of pericardial fluid. In addition, there are prominent central vascular structures and concern for some pulmonary edema. Limited evaluation of the left lung base due to the enlarged cardiac silhouette. IMPRESSION: Enlarged cardiac silhouette related to a large pericardial effusion. Evidence for vascular congestion or mild pulmonary edema. Electronically Signed   By: Markus Daft M.D.   On: 03/12/2015 19:06    Microbiology: Recent Results (from the past 240 hour(s))  Culture, blood (Routine X 2) w Reflex to ID Panel     Status: None   Collection Time:  03/12/15  7:34 PM  Result Value Ref Range Status   Specimen Description BLOOD RIGHT ARM  Final   Special Requests BOTTLES DRAWN AEROBIC AND ANAEROBIC 5CC  Final   Culture NO GROWTH 5 DAYS  Final   Report Status 03/17/2015 FINAL  Final  Culture, blood (Routine X 2) w Reflex to ID Panel     Status: None   Collection Time: 03/12/15  7:34 PM  Result Value Ref Range Status   Specimen Description BLOOD LEFT ARM  Final   Special Requests BOTTLES DRAWN AEROBIC AND ANAEROBIC 5CC  Final   Culture NO GROWTH 5 DAYS  Final   Report Status 03/17/2015 FINAL  Final  Culture, body fluid-bottle     Status: None (Preliminary result)   Collection Time: 03/19/15  2:50 PM  Result Value Ref Range Status   Specimen Description FLUID PERICARDIAL  Final   Special Requests NONE  Final   Culture NO GROWTH 2 DAYS  Final   Report Status PENDING  Incomplete  Gram stain     Status: None   Collection Time: 03/19/15  2:50 PM  Result Value Ref Range Status   Specimen Description FLUID PERICARDIAL  Final   Special Requests NONE  Final   Gram Stain   Final    RARE WBC PRESENT, PREDOMINANTLY MONONUCLEAR NO ORGANISMS SEEN    Report Status 03/19/2015 FINAL  Final  MRSA PCR Screening     Status: None   Collection Time: 03/21/15 10:21 AM  Result Value Ref Range Status   MRSA by PCR NEGATIVE NEGATIVE Final    Comment:        The GeneXpert MRSA Assay (FDA approved for NASAL specimens only), is one component of a comprehensive MRSA colonization surveillance program. It is not intended to diagnose MRSA infection nor to guide or monitor treatment for MRSA infections.      Labs: Basic Metabolic Panel:  Recent Labs Lab 03/16/15 0320 03/17/15 0307 03/18/15 0325 03/20/15 0813 03/22/15 0033  NA 142 145 142 144 138  K 3.8 3.7 3.5 3.5 3.5  CL 105 104 101 101 97*  CO2 26 29 30 31 31   GLUCOSE 98 97 98 93 134*  BUN 27* 26* 26* 23* 27*  CREATININE 1.55* 1.54* 1.58* 1.45* 1.44*  CALCIUM 8.9 8.9 8.9 8.8* 8.4*   MG 2.1  --   --   --   --    Liver Function Tests: No results for input(s): AST, ALT, ALKPHOS, BILITOT, PROT, ALBUMIN in the last 168 hours.  Recent Labs Lab  03/19/15 1450  AMYLASE 19*   No results for input(s): AMMONIA in the last 168 hours. CBC:  Recent Labs Lab 03/20/15 0813 03/21/15 0325 03/22/15 0033  WBC 15.4* 9.8 9.4  HGB 11.1* 10.2* 9.7*  HCT 34.7* 32.7* 30.0*  MCV 93.8 91.9 91.5  PLT 141* 132* 141*   Cardiac Enzymes: No results for input(s): CKTOTAL, CKMB, CKMBINDEX, TROPONINI in the last 168 hours. BNP: BNP (last 3 results)  Recent Labs  03/12/15 1934 03/15/15 1825  BNP 1212.7* 1484.5*    ProBNP (last 3 results)  Recent Labs  03/11/15 1725  PROBNP 839.0*    CBG: No results for input(s): GLUCAP in the last 168 hours.     SignedDomenic Polite MD.  [facility] 03/22/2015, 1:48 PM

## 2015-03-22 NOTE — Progress Notes (Signed)
Echocardiogram 2D Echocardiogram has been performed.  Luis Glenn 03/22/2015, 12:23 PM

## 2015-03-24 LAB — CULTURE, BODY FLUID-BOTTLE: CULTURE: NO GROWTH

## 2015-03-24 LAB — CULTURE, BODY FLUID W GRAM STAIN -BOTTLE

## 2015-03-25 ENCOUNTER — Other Ambulatory Visit: Payer: Self-pay

## 2015-03-25 ENCOUNTER — Non-Acute Institutional Stay (SKILLED_NURSING_FACILITY): Payer: Medicare Other | Admitting: Adult Health

## 2015-03-25 ENCOUNTER — Encounter: Payer: Self-pay | Admitting: Adult Health

## 2015-03-25 DIAGNOSIS — K5901 Slow transit constipation: Secondary | ICD-10-CM | POA: Diagnosis not present

## 2015-03-25 DIAGNOSIS — R5381 Other malaise: Secondary | ICD-10-CM

## 2015-03-25 DIAGNOSIS — N183 Chronic kidney disease, stage 3 unspecified: Secondary | ICD-10-CM

## 2015-03-25 DIAGNOSIS — I4891 Unspecified atrial fibrillation: Secondary | ICD-10-CM

## 2015-03-25 DIAGNOSIS — E876 Hypokalemia: Secondary | ICD-10-CM

## 2015-03-25 DIAGNOSIS — M79606 Pain in leg, unspecified: Secondary | ICD-10-CM | POA: Diagnosis not present

## 2015-03-25 DIAGNOSIS — E785 Hyperlipidemia, unspecified: Secondary | ICD-10-CM | POA: Diagnosis not present

## 2015-03-25 DIAGNOSIS — F419 Anxiety disorder, unspecified: Secondary | ICD-10-CM

## 2015-03-25 DIAGNOSIS — I319 Disease of pericardium, unspecified: Secondary | ICD-10-CM

## 2015-03-25 DIAGNOSIS — I313 Pericardial effusion (noninflammatory): Secondary | ICD-10-CM

## 2015-03-25 DIAGNOSIS — N400 Enlarged prostate without lower urinary tract symptoms: Secondary | ICD-10-CM

## 2015-03-25 DIAGNOSIS — M109 Gout, unspecified: Secondary | ICD-10-CM | POA: Diagnosis not present

## 2015-03-25 DIAGNOSIS — I5033 Acute on chronic diastolic (congestive) heart failure: Secondary | ICD-10-CM

## 2015-03-25 DIAGNOSIS — J309 Allergic rhinitis, unspecified: Secondary | ICD-10-CM

## 2015-03-25 DIAGNOSIS — I3139 Other pericardial effusion (noninflammatory): Secondary | ICD-10-CM

## 2015-03-25 NOTE — Progress Notes (Signed)
Patient ID: Luis Glenn, male   DOB: [birth year], 82 y.o.   MRN: [id number]    DATE:  03/25/2015   MRN:  [id number]  BIRTHDAY: [birth year]  Facility:  Nursing Home Location:  [place] Room Number: 509-P  LEVEL OF CARE:  SNF (31)  Contact Information    Name Relation Home Work Mobile   Hymon,Juliette Daughter [phone]         Code Status History    Date Active Date Inactive Code Status Order ID Comments User Context   03/12/2015 11:46 PM 03/22/2015  7:11 PM Full Code [id number]  Norval Morton, MD Inpatient       Chief Complaint  Patient presents with  . Hospitalization Follow-up    HISTORY OF PRESENT ILLNESS:  This is an 82 year old male who was been admitted to [facility] on 03/22/15 from [facility]. He has PMH of hypertension, PVD, hyperlipidemia, diabetes mellitus type 2, depression, BPH, pericardial effusion and chronic lower extremity edema. He was having worsening swelling of his left leg. Left leg had a blister. It popped and left an open wound on his leg. He also noticed increasing SOB. He was diagnosed with large pericardial effusion without tamponade physiology. He had pericardiocentesis on 2/7, 1500 mL remove and the drain was eventually removed on 03/21/15. Cardiology, Dr. Burt Knack, felt patient was stable for  discharge with Lasix po. He has been admitted for a short-term rehabilitation.  PAST MEDICAL HISTORY:  Past Medical History  Diagnosis Date  . GOUT   . PERIPHERAL VASCULAR DISEASE   . DIVERTICULOSIS, COLON   . BENIGN PROSTATIC HYPERTROPHY   . DEPRESSION   . DIABETES MELLITUS, TYPE II   . GERD   . HYPERLIPIDEMIA   . HYPERTENSION   . CATARACTS   . OSTEOARTHRITIS   . ANXIETY   . ALLERGIC RHINITIS   . Pericardial effusion      CURRENT MEDICATIONS: Reviewed  Patient's Medications  New Prescriptions   No medications on file  Previous Medications   ACETAMINOPHEN (TYLENOL) 500 MG TABLET    Take 500 mg by  mouth 4 (four) times daily - after meals and at bedtime. Take 4 times every day per patient   ALLOPURINOL (ZYLOPRIM) 300 MG TABLET    Take 1 tablet (300 mg total) by mouth daily.   ALPRAZOLAM (XANAX) 0.5 MG TABLET    Take 1 tablet (0.5 mg total) by mouth 3 (three) times daily as needed for anxiety.   APIXABAN (ELIQUIS) 2.5 MG TABS TABLET    Take 1 tablet (2.5 mg total) by mouth 2 (two) times daily.   FLUTICASONE (FLONASE) 50 MCG/ACT NASAL SPRAY    Place 2 sprays into both nostrils daily.   FUROSEMIDE (LASIX) 40 MG TABLET    Take 1.5 tablets (60 mg total) by mouth 2 (two) times daily.   OXYCODONE (OXY IR/ROXICODONE) 5 MG IMMEDIATE RELEASE TABLET    Take 1 tablet (5 mg total) by mouth every 6 (six) hours as needed for severe pain.   POLYETHYLENE GLYCOL (MIRALAX / GLYCOLAX) PACKET    Take 17 g by mouth daily as needed.   POTASSIUM CHLORIDE ER 20 MEQ TBCR    Take 1 tablet by mouth 2 (two) times daily.   SIMVASTATIN (ZOCOR) 40 MG TABLET    Take 1 tablet (40 mg total) by mouth daily at 6 PM.   SPIRONOLACTONE (ALDACTONE) 25 MG TABLET    Take 1 tablet (25 mg total) by  mouth daily.   TAMSULOSIN (FLOMAX) 0.4 MG CAPS CAPSULE    TAKE ONE CAPSULE BY MOUTH DAILY  Modified Medications   No medications on file  Discontinued Medications   No medications on file     Allergies  Allergen Reactions  . Penicillins Swelling and Rash    Has patient had a PCN reaction causing immediate rash, facial/tongue/throat swelling, SOB or lightheadedness with hypotension: NO Has patient had a PCN reaction causing severe rash involving mucus membranes or skin necrosis:NO Has patient had a PCN reaction that required hospitalization No Has patient had a PCN reaction occurring within the last 10 years: NO If all of the above answers are "NO", then may proceed with Cephalosporin use.     REVIEW OF SYSTEMS:  GENERAL: no change in appetite, no fatigue, no weight changes, no fever, chills or weakness EYES: Denies change in  vision, dry eyes, eye pain, itching or discharge EARS: Denies change in hearing, ringing in ears, or earache NOSE: Denies nasal congestion or epistaxis MOUTH and THROAT: Denies oral discomfort, gingival pain or bleeding, pain from teeth or hoarseness   RESPIRATORY: no cough, SOB, DOE, wheezing, hemoptysis CARDIAC: no chest pain, edema or palpitations GI: no abdominal pain, diarrhea, constipation, heart burn, nausea or vomiting GU: Denies dysuria, frequency, hematuria, incontinence, or discharge PSYCHIATRIC: Denies feeling of depression or anxiety. No report of hallucinations, insomnia, paranoia, or agitation   PHYSICAL EXAMINATION  GENERAL APPEARANCE: Well nourished. In no acute distress. Normal body habitus SKIN:  Left leg open wound, bilateral legs covered with unna boot HEAD: Normal in size and contour. No evidence of trauma EYES: Lids open and close normally. No blepharitis, entropion or ectropion. PERRL. Conjunctivae are clear and sclerae are white. Lenses are without opacity EARS: Pinnae are normal. Patient hears normal voice tunes of the examiner MOUTH and THROAT: Lips are without lesions. Oral mucosa is moist and without lesions. Tongue is normal in shape, size, and color and without lesions NECK: supple, trachea midline, no neck masses, no thyroid tenderness, no thyromegaly LYMPHATICS: no LAN in the neck, no supraclavicular LAN RESPIRATORY: breathing is even & unlabored, BS CTAB CARDIAC: RRR, no murmur,no extra heart sounds, no edema GI: abdomen soft, normal BS, no masses, no tenderness, no hepatomegaly, no splenomegaly EXTREMITIES:  Able to move 4 extremities; BLE wrapped with unna boot PSYCHIATRIC: Alert and oriented X 3. Affect and behavior are appropriate  LABS/RADIOLOGY: Labs reviewed: Basic Metabolic Panel:  Recent Labs  03/16/15 0320  03/18/15 0325 03/20/15 0813 03/22/15 0033  NA 142  < > 142 144 138  K 3.8  < > 3.5 3.5 3.5  CL 105  < > 101 101 97*  CO2 26  <  > 30 31 31   GLUCOSE 98  < > 98 93 134*  BUN 27*  < > 26* 23* 27*  CREATININE 1.55*  < > 1.58* 1.45* 1.44*  CALCIUM 8.9  < > 8.9 8.8* 8.4*  MG 2.1  --   --   --   --   < > = values in this interval not displayed. Liver Function Tests:  Recent Labs  10/17/14 1235 03/11/15 1725  AST 14 16  ALT 7 9  ALKPHOS 107 107  BILITOT 1.1 0.6  PROT 7.0 6.0  ALBUMIN 4.3 3.4*    Recent Labs  03/19/15 1450  AMYLASE 19*   CBC:  Recent Labs  10/17/14 1235 11/01/14 1103 03/12/15 1934  03/20/15 0813 03/21/15 0325 03/22/15 0033  WBC 6.2 7.8  4.9  < > 15.4* 9.8 9.4  NEUTROABS 4.6 6.2 3.8  --   --   --   --   HGB 11.7* 11.0* 11.5*  < > 11.1* 10.2* 9.7*  HCT 35.5* 33.6* 36.7*  < > 34.7* 32.7* 30.0*  MCV 91.0 90.4 92.7  < > 93.8 91.9 91.5  PLT 223.0 220.0 184  < > 141* 132* 141*  < > = values in this interval not displayed.  Cardiac Enzymes:  Recent Labs  03/13/15 0045 03/13/15 0545 03/13/15 1147  TROPONINI <0.03 <0.03 <0.03     Dg Chest Port 1 View  03/12/2015  CLINICAL DATA:  Shortness of breath and known pericardial effusion. EXAM: PORTABLE CHEST 1 VIEW COMPARISON:  CT 07/11/2014 FINDINGS: Single view of the chest again demonstrates enlargement of the cardiac silhouette. Findings are compatible with a large amount of pericardial fluid. In addition, there are prominent central vascular structures and concern for some pulmonary edema. Limited evaluation of the left lung base due to the enlarged cardiac silhouette. IMPRESSION: Enlarged cardiac silhouette related to a large pericardial effusion. Evidence for vascular congestion or mild pulmonary edema. Electronically Signed   By: Markus Daft M.D.   On: 03/12/2015 19:06    ASSESSMENT/PLAN:  Physical deconditioning - for rehabilitation  Pericardial effusion S/P pericardiocentesis - continue Lasix 40 mg take 1 1/2 tab = 60mg  twice a day; follow-up with Dr. Stephens November in 1-2 weeks; for repeat echo in 1-2 weeks  Acute on chronic diastolic  CHF - continue Lasix 40 mg take 1 1/2 tab = 60 mg by mouth twice a day and Aldactone 25 mg 1 tab daily  Atrial fibrillation - rate controlled; continue Eliquis 2.5 mg 1 tab by mouth twice a day  Chronic kidney disease, stage III - creatinine 1.44; check BMP  Hyperlipidemia - continue Zocor 40 mg 1 tab by mouth every 6 p.m.  BPH - continue Flomax 0.4 mg 1 capsule by mouth daily  Constipation - increase MiraLAX to 17 g by mouth twice a day and start senna S 2 tabs by mouth twice a day  Anxiety - mood this is stable; continue Xanax 0.5 mg 1 tab by mouth 3 times a day when necessary  Lower extremity pain - continue oxycodone 5 mg 1 tab by mouth every 6 hours when necessary  Hypokalemia - K2.5; continue KCl 20 meq 1 tab by mouth twice a day  Gout - continue allopurinol 300 mg 1 tab by mouth daily  Allergic rhinitis - change Flonase 50 g 2 sprays to both nostrils daily when necessary     Goals of care:  Short-term rehabilitation    [facility], [facility] [phone]

## 2015-03-26 ENCOUNTER — Encounter: Payer: Self-pay | Admitting: *Deleted

## 2015-03-26 ENCOUNTER — Encounter: Payer: Self-pay | Admitting: Internal Medicine

## 2015-03-26 ENCOUNTER — Non-Acute Institutional Stay (SKILLED_NURSING_FACILITY): Payer: Medicare Other | Admitting: Internal Medicine

## 2015-03-26 DIAGNOSIS — D631 Anemia in chronic kidney disease: Secondary | ICD-10-CM

## 2015-03-26 DIAGNOSIS — I5033 Acute on chronic diastolic (congestive) heart failure: Secondary | ICD-10-CM | POA: Diagnosis not present

## 2015-03-26 DIAGNOSIS — I4819 Other persistent atrial fibrillation: Secondary | ICD-10-CM

## 2015-03-26 DIAGNOSIS — I319 Disease of pericardium, unspecified: Secondary | ICD-10-CM | POA: Diagnosis not present

## 2015-03-26 DIAGNOSIS — N183 Chronic kidney disease, stage 3 unspecified: Secondary | ICD-10-CM

## 2015-03-26 DIAGNOSIS — I481 Persistent atrial fibrillation: Secondary | ICD-10-CM | POA: Diagnosis not present

## 2015-03-26 DIAGNOSIS — K5900 Constipation, unspecified: Secondary | ICD-10-CM

## 2015-03-26 DIAGNOSIS — F411 Generalized anxiety disorder: Secondary | ICD-10-CM

## 2015-03-26 DIAGNOSIS — N189 Chronic kidney disease, unspecified: Secondary | ICD-10-CM

## 2015-03-26 DIAGNOSIS — R5381 Other malaise: Secondary | ICD-10-CM | POA: Diagnosis not present

## 2015-03-26 DIAGNOSIS — I313 Pericardial effusion (noninflammatory): Secondary | ICD-10-CM

## 2015-03-26 DIAGNOSIS — I3139 Other pericardial effusion (noninflammatory): Secondary | ICD-10-CM

## 2015-03-26 DIAGNOSIS — N400 Enlarged prostate without lower urinary tract symptoms: Secondary | ICD-10-CM | POA: Diagnosis not present

## 2015-03-26 LAB — BASIC METABOLIC PANEL
BUN: 34 mg/dL — AB (ref 4–21)
Creatinine: 1.4 mg/dL — AB (ref 0.6–1.3)
GLUCOSE: 92 mg/dL
Potassium: 4.6 mmol/L (ref 3.4–5.3)
SODIUM: 141 mmol/L (ref 137–147)

## 2015-03-26 LAB — CBC AND DIFFERENTIAL
HEMATOCRIT: 34 % — AB (ref 41–53)
Hemoglobin: 10.6 g/dL — AB (ref 13.5–17.5)
NEUTROS ABS: 4 /uL
PLATELETS: 278 10*3/uL (ref 150–399)
WBC: 6.8 10*3/mL

## 2015-03-26 NOTE — Progress Notes (Signed)
Patient ID: Luis Glenn, male   DOB: 1927-05-03, 82 y.o.   MRN: [id number]    LOCATION: [place]  PCP: Binnie Rail, MD   Code Status: Full Code  Goals of care: Advanced Directive information Advanced Directives 03/13/2015  Does patient have an advance directive? No  Would patient like information on creating an advanced directive? No - patient declined information       Extended Emergency Contact Information Primary Emergency Contact: Safranek,Juliette  [place] Phone: [phone] Relation: Daughter   Allergies  Allergen Reactions  . Penicillins Swelling and Rash    Has patient had a PCN reaction causing immediate rash, facial/tongue/throat swelling, SOB or lightheadedness with hypotension: NO Has patient had a PCN reaction causing severe rash involving mucus membranes or skin necrosis:NO Has patient had a PCN reaction that required hospitalization No Has patient had a PCN reaction occurring within the last 10 years: NO If all of the above answers are "NO", then may proceed with Cephalosporin use.    Chief Complaint  Patient presents with  . New Admit To SNF    New Admissions     HPI:  Patient is a 82 y.o. male seen today for short term rehabilitation post hospital admission from acute on chronic diastolic heart failure. He required iv lasix. He diuresed 12.9 l. He also had large pericardial effusion without tamponade and underwent pericardiocentesis on 03/19/15. He is seen in his room today. He has been constipated. He has PMH of hypertension, PVD, hyperlipidemia, diabetes mellitus type 2, depression, BPH, chronic lower extremity edema.    Review of Systems:  Constitutional: Negative for fever, chills.  HENT: Negative for headache, congestion Eyes: Negative for double vision and discharge.  Respiratory: Negative for cough, shortness of breath and wheezing.   Cardiovascular: Negative for chest pain, palpitations Gastrointestinal: Negative for  heartburn, nausea, vomiting, abdominal pain Genitourinary: Negative for dysuria Musculoskeletal: Negative for fall Skin: Negative for itching, rash.  Neurological: Negative for dizziness Psychiatric/Behavioral: Negative for depression   Past Medical History  Diagnosis Date  . GOUT   . PERIPHERAL VASCULAR DISEASE   . DIVERTICULOSIS, COLON   . BENIGN PROSTATIC HYPERTROPHY   . DEPRESSION   . DIABETES MELLITUS, TYPE II   . GERD   . HYPERLIPIDEMIA   . HYPERTENSION   . CATARACTS   . OSTEOARTHRITIS   . ANXIETY   . ALLERGIC RHINITIS   . Pericardial effusion    Past Surgical History  Procedure Laterality Date  . Tonsillectomy  10/1942  . Cardiac catheterization N/A 03/19/2015    Procedure: Pericardiocentesis;  Surgeon: Jettie Booze, MD;  Location: [facility];  Service: Cardiovascular;  Laterality: N/A;   Social History:   reports that he quit smoking about 26 years ago. His smoking use included Cigarettes. He has a 120 pack-year smoking history. He does not have any smokeless tobacco history on file. He reports that he drinks alcohol. He reports that he does not use illicit drugs.  Family History  Problem Relation Age of Onset  . Diabetes Mother   . Hypertension Mother   . Diabetes Father   . Hypertension Father   . Alcohol abuse Other   . Breast cancer Other     Medications:   Medication List       This list is accurate as of: 03/26/15 11:06 AM.  Always use your most recent med list.  acetaminophen 500 MG tablet  Commonly known as:  TYLENOL  Take 500 mg by mouth 4 (four) times daily - after meals and at bedtime. Take 4 times every day per patient     allopurinol 300 MG tablet  Commonly known as:  ZYLOPRIM  Take 1 tablet (300 mg total) by mouth daily.     ALPRAZolam 0.5 MG tablet  Commonly known as:  XANAX  Take 1 tablet (0.5 mg total) by mouth 3 (three) times daily as needed for anxiety.     apixaban 2.5 MG Tabs tablet  Commonly  known as:  ELIQUIS  Take 1 tablet (2.5 mg total) by mouth 2 (two) times daily.     fluticasone 50 MCG/ACT nasal spray  Commonly known as:  FLONASE  Place 2 sprays into both nostrils daily.     furosemide 40 MG tablet  Commonly known as:  LASIX  Take 1.5 tablets (60 mg total) by mouth 2 (two) times daily.     oxyCODONE 5 MG immediate release tablet  Commonly known as:  Oxy IR/ROXICODONE  Take 1 tablet (5 mg total) by mouth every 6 (six) hours as needed for severe pain.     polyethylene glycol packet  Commonly known as:  MIRALAX / GLYCOLAX  Take 17 g by mouth daily as needed.     Potassium Chloride ER 20 MEQ Tbcr  Take 1 tablet by mouth 2 (two) times daily.     senna 8.6 MG tablet  Commonly known as:  SENOKOT  Take 1 tablet by mouth 2 (two) times daily.     simvastatin 40 MG tablet  Commonly known as:  ZOCOR  Take 1 tablet (40 mg total) by mouth daily at 6 PM.     spironolactone 25 MG tablet  Commonly known as:  ALDACTONE  Take 1 tablet (25 mg total) by mouth daily.     tamsulosin 0.4 MG Caps capsule  Commonly known as:  FLOMAX  TAKE ONE CAPSULE BY MOUTH DAILY        Immunizations: Immunization History  Administered Date(s) Administered  . Influenza Split 11/12/2010, 11/02/2011  . Influenza Whole 11/09/2008, 11/05/2009  . Influenza,inj,Quad PF,36+ Mos 10/10/2012, 09/29/2013, 11/12/2014  . PPD Test 03/22/2015  . Pneumococcal Polysaccharide-23 02/09/2002  . Td 04/27/2008     Physical Exam: Filed Vitals:   03/26/15 1056  BP: 137/55  Pulse: 59  Temp: 99 F (37.2 C)  TempSrc: Oral  Resp: 18  Height: 6\' 2"  (1.88 m)  Weight: 204 lb (92.534 kg)   Body mass index is 26.18 kg/(m^2).  General- elderly male, well built, in no acute distress Head- normocephalic, atraumatic Nose- no maxillary or frontal sinus tenderness, no nasal discharge Throat- moist mucus membrane  Eyes- PERRLA, EOMI, no pallor, no icterus, no discharge Neck- no cervical  lymphadenopathy Cardiovascular- normal s1,s2, no murmurs Respiratory- bilateral clear to auscultation, no wheeze, no rhonchi, no crackles, no use of accessory muscles Abdomen- bowel sounds present, soft, non tender Musculoskeletal- able to move all 4 extremities, generalized weakness Neurological- alert and oriented to person, place and time Skin- warm and dry Psychiatry- normal mood and affect    Labs reviewed: Basic Metabolic Panel:  Recent Labs  03/16/15 0320  03/18/15 0325 03/20/15 0813 03/22/15 03/22/15 0033  NA 142  < > 142 144 138 138  K 3.8  < > 3.5 3.5  --  3.5  CL 105  < > 101 101  --  97*  CO2 26  < >  30 31  --  31  GLUCOSE 98  < > 98 93  --  134*  BUN 27*  < > 26* 23* 27* 27*  CREATININE 1.55*  < > 1.58* 1.45* 1.4* 1.44*  CALCIUM 8.9  < > 8.9 8.8*  --  8.4*  MG 2.1  --   --   --   --   --   < > = values in this interval not displayed. Liver Function Tests:  Recent Labs  10/17/14 1235 03/11/15 1725  AST 14 16  ALT 7 9  ALKPHOS 107 107  BILITOT 1.1 0.6  PROT 7.0 6.0  ALBUMIN 4.3 3.4*    Recent Labs  03/19/15 1450  AMYLASE 19*   No results for input(s): AMMONIA in the last 8760 hours. CBC:  Recent Labs  10/17/14 1235 11/01/14 1103 03/12/15 1934  03/20/15 0813 03/21/15 0325 03/22/15 03/22/15 0033  WBC 6.2 7.8 4.9  < > 15.4* 9.8 9.4 9.4  NEUTROABS 4.6 6.2 3.8  --   --   --   --   --   HGB 11.7* 11.0* 11.5*  < > 11.1* 10.2*  --  9.7*  HCT 35.5* 33.6* 36.7*  < > 34.7* 32.7*  --  30.0*  MCV 91.0 90.4 92.7  < > 93.8 91.9  --  91.5  PLT 223.0 220.0 184  < > 141* 132*  --  141*  < > = values in this interval not displayed. Cardiac Enzymes:  Recent Labs  03/13/15 0045 03/13/15 0545 03/13/15 1147  TROPONINI <0.03 <0.03 <0.03   BNP: Invalid input(s): POCBNP CBG: No results for input(s): GLUCAP in the last 8760 hours.  Radiological Exams: Dg Chest Port 1 View  03/12/2015  CLINICAL DATA:  Shortness of breath and known pericardial  effusion. EXAM: PORTABLE CHEST 1 VIEW COMPARISON:  CT 07/11/2014 FINDINGS: Single view of the chest again demonstrates enlargement of the cardiac silhouette. Findings are compatible with a large amount of pericardial fluid. In addition, there are prominent central vascular structures and concern for some pulmonary edema. Limited evaluation of the left lung base due to the enlarged cardiac silhouette. IMPRESSION: Enlarged cardiac silhouette related to a large pericardial effusion. Evidence for vascular congestion or mild pulmonary edema. Electronically Signed   By: Markus Daft M.D.   On: 03/12/2015 19:06    Assessment/Plan   Physical deconditioning Will have him work with physical therapy and occupational therapy team to help with gait training and muscle strengthening exercises.fall precautions. Skin care. Encourage to be out of bed.   Pericardial effusion  S/P pericardiocentesis. Fluid was transudative in nature. continue Lasix 60 mg bid and has f/u with cardiology  Acute on chronic diastolic CHF continue Lasix 60 mg bid and Aldactone 25 mg daily, monitor daily weight, check bmp, continue kcl supplement  Constipation Add miralax bid and senna s bid to help with bowel movement and monitor  Atrial fibrillation Rate controlled. Continue eliquis for anticoagulation  Anemia of chronic disease Monitor cbc  ckd 3 Monitor bmp  BPH continue Flomax 0.4 mg daily  anxiety Stable, continue prn xanax   Goals of care: short term rehabilitation   Labs/tests ordered: cbc, bmp  Family/ staff Communication: reviewed care plan with patient and nursing supervisor    Blanchie Serve, MD Internal Medicine [place], [place] [address] Cell Phone (Monday-Friday 8 am - 5 pm): [phone] On Call: [phone] and follow prompts after 5 pm and  on weekends Office Phone: [phone] Office Fax: [phone]

## 2015-03-28 ENCOUNTER — Encounter: Payer: Self-pay | Admitting: Adult Health

## 2015-03-28 ENCOUNTER — Non-Acute Institutional Stay (SKILLED_NURSING_FACILITY): Payer: Medicare Other | Admitting: Adult Health

## 2015-03-28 DIAGNOSIS — K5901 Slow transit constipation: Secondary | ICD-10-CM

## 2015-03-28 NOTE — Progress Notes (Signed)
Patient ID: Luis Glenn, male   DOB: 04/24/1927, 82 y.o.   MRN: [id number]    DATE:  03/28/15  MRN:  [id number]  BIRTHDAY: 1927-02-16  Facility:  [facility]ursing Home Location:  [place] Room Number: 509-P  LEVEL OF CARE:  SNF [phone])  Contact Information    Name Relation Home Work Mobile   Xie,Juliette Daughter [phone]  [phone]   Marilynn Rail   [phone]       Code Status History    Date Active Date Inactive Code Status Order ID Comments User Context   03/12/2015 11:46 PM 03/22/2015  7:11 PM Full Code [id number]  Norval Morton, MD Inpatient       Chief Complaint  Patient presents with  . Acute Visit    Constipation    HISTORY OF PRESENT ILLNESS:  This is an 82 year old male who complains of constipation. Although his abdomen is soft, non-tender and has normoactive bowel sounds. Inspite of being on Senna-S 2 tabs BID and Miralax 17 gm BID, he claims not moving his bowels even when he was in the hospital.  He has been admitted to [facility] on 03/22/15 from [facility]. He has PMH of hypertension, PVD, hyperlipidemia, diabetes mellitus type 2, depression, BPH, pericardial effusion and chronic lower extremity edema. He was having worsening swelling of his left leg. Left leg had a blister. It popped and left an open wound on his leg. He also noticed increasing SOB. He was diagnosed with large pericardial effusion without tamponade physiology. He had pericardiocentesis on 2/7, 1500 mL remove and the drain was eventually removed on 03/21/15. Cardiology, Dr. Burt Knack, felt patient was stable for  discharge with Lasix po. He has been admitted for a short-term rehabilitation.  PAST MEDICAL HISTORY:  Past Medical History  Diagnosis Date  . GOUT   . PERIPHERAL VASCULAR DISEASE   . DIVERTICULOSIS, COLON   . BENIGN PROSTATIC HYPERTROPHY   . DEPRESSION   . DIABETES MELLITUS, TYPE II   . GERD   . HYPERLIPIDEMIA   . HYPERTENSION    . CATARACTS   . OSTEOARTHRITIS   . ANXIETY   . ALLERGIC RHINITIS   . Pericardial effusion      CURRENT MEDICATIONS: Reviewed  Patient's Medications  New Prescriptions   No medications on file  Previous Medications   ACETAMINOPHEN (TYLENOL) 500 MG TABLET    Take 500 mg by mouth 4 (four) times daily - after meals and at bedtime. Take 4 times every day per patient   ALLOPURINOL (ZYLOPRIM) 300 MG TABLET    Take 1 tablet (300 mg total) by mouth daily.   ALPRAZOLAM (XANAX) 0.5 MG TABLET    Take 1 tablet (0.5 mg total) by mouth 3 (three) times daily as needed for anxiety.   APIXABAN (ELIQUIS) 2.5 MG TABS TABLET    Take 1 tablet (2.5 mg total) by mouth 2 (two) times daily.   FLUTICASONE (FLONASE) 50 MCG/ACT NASAL SPRAY    Place 2 sprays into both nostrils daily as needed for allergies or rhinitis.   FUROSEMIDE (LASIX) 20 MG TABLET    Take 20 mg by mouth 2 (two) times daily. Give three (3) tablets to = 60 mg   MAGNESIUM HYDROXIDE (MILK OF MAGNESIA PO)    Take 45 mLs by mouth daily as needed. x2 days per SO, Stop 03/30/15   OXYCODONE (OXY IR/ROXICODONE) 5 MG IMMEDIATE RELEASE TABLET    Take 1 tablet (5 mg total) by  mouth every 6 (six) hours as needed for severe pain.   POLYETHYLENE GLYCOL (MIRALAX / GLYCOLAX) PACKET    Take 17 g by mouth 2 (two) times daily.   POTASSIUM CHLORIDE ER 20 MEQ TBCR    Take 1 tablet by mouth 2 (two) times daily.   SENNA-DOCUSATE (SENOKOT-S) 8.6-50 MG TABLET    Take 1 tablet by mouth 2 (two) times daily.   SIMVASTATIN (ZOCOR) 40 MG TABLET    Take 1 tablet (40 mg total) by mouth daily at 6 PM.   SPIRONOLACTONE (ALDACTONE) 25 MG TABLET    Take 1 tablet (25 mg total) by mouth daily.   TAMSULOSIN (FLOMAX) 0.4 MG CAPS CAPSULE    TAKE ONE CAPSULE BY MOUTH DAILY  Modified Medications   No medications on file  Discontinued Medications   FLUTICASONE (FLONASE) 50 MCG/ACT NASAL SPRAY    Place 2 sprays into both nostrils daily.   FUROSEMIDE (LASIX) 40 MG TABLET    Take 1.5  tablets (60 mg total) by mouth 2 (two) times daily.   POLYETHYLENE GLYCOL (MIRALAX / GLYCOLAX) PACKET    Take 17 g by mouth daily as needed.   SENNA (SENOKOT) 8.6 MG TABLET    Take 1 tablet by mouth 2 (two) times daily.     Allergies  Allergen Reactions  . Penicillins Swelling and Rash    Has patient had a PCN reaction causing immediate rash, facial/tongue/throat swelling, SOB or lightheadedness with hypotension: NO Has patient had a PCN reaction causing severe rash involving mucus membranes or skin necrosis:NO Has patient had a PCN reaction that required hospitalization No Has patient had a PCN reaction occurring within the last 10 years: NO If all of the above answers are "NO", then may proceed with Cephalosporin use.     REVIEW OF SYSTEMS:  GENERAL: no change in appetite, no fatigue, no weight changes, no fever, chills or weakness EYES: Denies change in vision, dry eyes, eye pain, itching or discharge EARS: Denies change in hearing, ringing in ears, or earache NOSE: Denies nasal congestion or epistaxis MOUTH and THROAT: Denies oral discomfort, gingival pain or bleeding, pain from teeth or hoarseness   RESPIRATORY: no cough, SOB, DOE, wheezing, hemoptysis CARDIAC: no chest pain, edema or palpitations GI: no abdominal pain, diarrhea, heart burn, nausea or vomiting, +constipation GU: Denies dysuria, frequency, hematuria, incontinence, or discharge PSYCHIATRIC: Denies feeling of depression or anxiety. No report of hallucinations, insomnia, paranoia, or agitation   PHYSICAL EXAMINATION  GENERAL APPEARANCE: Well nourished. In no acute distress. Normal body habitus SKIN:  Left leg open wound, bilateral legs covered with unna boot HEAD: Normal in size and contour. No evidence of trauma EYES: Lids open and close normally. No blepharitis, entropion or ectropion. PERRL. Conjunctivae are clear and sclerae are white. Lenses are without opacity EARS: Pinnae are normal. Patient hears normal  voice tunes of the examiner MOUTH and THROAT: Lips are without lesions. Oral mucosa is moist and without lesions. Tongue is normal in shape, size, and color and without lesions NECK: supple, trachea midline, no neck masses, no thyroid tenderness, no thyromegaly LYMPHATICS: no LAN in the neck, no supraclavicular LAN RESPIRATORY: breathing is even & unlabored, BS CTAB CARDIAC: RRR, no murmur,no extra heart sounds, no edema GI: abdomen soft, normal BS, no masses, no tenderness, no hepatomegaly, no splenomegaly EXTREMITIES:  Able to move 4 extremities; BLE wrapped with unna boot PSYCHIATRIC: Alert and oriented X 3. Affect and behavior are appropriate  LABS/RADIOLOGY: Labs reviewed: Basic Metabolic Panel:  Recent Labs  03/16/15 0320  03/18/15 0325 03/20/15 0813 03/22/15 03/22/15 0033  NA 142  < > 142 144 138 138  K 3.8  < > 3.5 3.5  --  3.5  CL 105  < > 101 101  --  97*  CO2 26  < > 30 31  --  31  GLUCOSE 98  < > 98 93  --  134*  BUN 27*  < > 26* 23* 27* 27*  CREATININE 1.55*  < > 1.58* 1.45* 1.4* 1.44*  CALCIUM 8.9  < > 8.9 8.8*  --  8.4*  MG 2.1  --   --   --   --   --   < > = values in this interval not displayed. Liver Function Tests:  Recent Labs  10/17/14 1235 03/11/15 1725  AST 14 16  ALT 7 9  ALKPHOS 107 107  BILITOT 1.1 0.6  PROT 7.0 6.0  ALBUMIN 4.3 3.4*    Recent Labs  03/19/15 1450  AMYLASE 19*   CBC:  Recent Labs  10/17/14 1235 11/01/14 1103 03/12/15 1934  03/20/15 0813 03/21/15 0325 03/22/15 03/22/15 0033  WBC 6.2 7.8 4.9  < > 15.4* 9.8 9.4 9.4  NEUTROABS 4.6 6.2 3.8  --   --   --   --   --   HGB 11.7* 11.0* 11.5*  < > 11.1* 10.2*  --  9.7*  HCT 35.5* 33.6* 36.7*  < > 34.7* 32.7*  --  30.0*  MCV 91.0 90.4 92.7  < > 93.8 91.9  --  91.5  PLT 223.0 220.0 184  < > 141* 132*  --  141*  < > = values in this interval not displayed.  Cardiac Enzymes:  Recent Labs  03/13/15 0045 03/13/15 0545 03/13/15 1147  TROPONINI <0.03 <0.03 <0.03      Dg Chest Port 1 View  03/12/2015  CLINICAL DATA:  Shortness of breath and known pericardial effusion. EXAM: PORTABLE CHEST 1 VIEW COMPARISON:  CT 07/11/2014 FINDINGS: Single view of the chest again demonstrates enlargement of the cardiac silhouette. Findings are compatible with a large amount of pericardial fluid. In addition, there are prominent central vascular structures and concern for some pulmonary edema. Limited evaluation of the left lung base due to the enlarged cardiac silhouette. IMPRESSION: Enlarged cardiac silhouette related to a large pericardial effusion. Evidence for vascular congestion or mild pulmonary edema. Electronically Signed   By: Markus Daft M.D.   On: 03/12/2015 19:06    ASSESSMENT/PLAN:  Constipation -  start Linzess 145 mcg 1 capsule daily, Magnesium citrate 296 ml PO X 1 and Dulcolax 10 mg suppository 1 rectally daily, continue  MiraLAX  17 g by mouth twice a day and  Senna S 2 tabs by mouth twice a day   [facility], NP [employer] [phone]

## 2015-04-01 NOTE — Progress Notes (Signed)
Cardiology Office Note   Date:  04/02/2015   ID:  Luis Glenn, DOB 02-Jul-1927, MRN [id number]  PCP:  Binnie Rail, MD  Cardiologist:  Dr. Acie Fredrickson    Chief Complaint  Patient presents with  . Hospitalization Follow-up    post pericardialcentesis       History of Present Illness: Luis Glenn is a 82 y.o. male who presents for post hospitalization 03/12/15 to 03/22/15.   He was admitted with increasing edema and on echo had a larger than his usual pericardial effusion.   Diuretics were initially tried but on 03/19/15 he had pericardiocentesis of 1550cc with catheter left in place with total drained of 1800 or so.  Initially the thought was for pericardial window and pt was see by Dr. Servando Snare.  But due to his age it was decided to proceed with pericardiocentesis first.  As above.  With diuresing pt was   Total 12.9L negative of all fluid.  Wt at discharge was 174- admit wt was 204.  PMH of moderately large pericardial effusion, gout, HTN, HLD, DM II and h/o afib on eliquis presented with wohe was previously treated with colchicine and ibuprofen, interestingly, he is pericardial effusion did not be decreasing in size despite taking colchicine and ibuprofen. He was later taken off colchicine. He denies any history of cancer.   Today pt feels well but wt is down to 154 by AM scales.  Down 20 lbs since discharge.  Pt eats at least 3 meals a day.  He was constipated and now has some diarrhea.    Denies SOB, no chest pain.  His legs are wrapped but no edema.  On pathology of pericardial fluid no malignant cells were found.   Past Medical History  Diagnosis Date  . GOUT   . PERIPHERAL VASCULAR DISEASE   . DIVERTICULOSIS, COLON   . BENIGN PROSTATIC HYPERTROPHY   . DEPRESSION   . DIABETES MELLITUS, TYPE II   . GERD   . HYPERLIPIDEMIA   . HYPERTENSION   . CATARACTS   . OSTEOARTHRITIS   . ANXIETY   . ALLERGIC RHINITIS   . Pericardial effusion     Past Surgical History  Procedure  Laterality Date  . Tonsillectomy  10/1942  . Cardiac catheterization N/A 03/19/2015    Procedure: Pericardiocentesis;  Surgeon: Jettie Booze, MD;  Location: [facility];  Service: Cardiovascular;  Laterality: N/A;     Current Outpatient Prescriptions  Medication Sig Dispense Refill  . acetaminophen (TYLENOL) 500 MG tablet Take 500 mg by mouth 4 (four) times daily - after meals and at bedtime. Take 4 times every day per patient    . allopurinol (ZYLOPRIM) 300 MG tablet Take 1 tablet (300 mg total) by mouth daily. 30 tablet 6  . ALPRAZolam (XANAX) 0.5 MG tablet Take 1 tablet (0.5 mg total) by mouth 3 (three) times daily as needed for anxiety. 20 tablet 0  . apixaban (ELIQUIS) 2.5 MG TABS tablet Take 1 tablet (2.5 mg total) by mouth 2 (two) times daily. 60 tablet 9  . fluticasone (FLONASE) 50 MCG/ACT nasal spray Place 2 sprays into both nostrils daily as needed for allergies or rhinitis.    . furosemide (LASIX) 20 MG tablet Take 60 mg by mouth 2 (two) times daily.     Marland Kitchen oxyCODONE (OXY IR/ROXICODONE) 5 MG immediate release tablet Take 1 tablet (5 mg total) by mouth every 6 (six) hours as needed for severe pain. 20 tablet 0  .  polyethylene glycol (MIRALAX / GLYCOLAX) packet Take 17 g by mouth 2 (two) times daily.    . Potassium Chloride ER 20 MEQ TBCR Take 1 tablet by mouth 2 (two) times daily.    Marland Kitchen senna-docusate (SENOKOT-S) 8.6-50 MG tablet Take 1 tablet by mouth 2 (two) times daily.    . simvastatin (ZOCOR) 40 MG tablet Take 1 tablet (40 mg total) by mouth daily at 6 PM. 90 tablet 1  . spironolactone (ALDACTONE) 25 MG tablet Take 1 tablet (25 mg total) by mouth daily. 30 tablet 3  . tamsulosin (FLOMAX) 0.4 MG CAPS capsule TAKE ONE CAPSULE BY MOUTH DAILY 90 capsule 3   No current facility-administered medications for this visit.    Allergies:   Penicillins    Social History:  The patient  reports that he quit smoking about 26 years ago. His smoking use included Cigarettes. He has  a 120 pack-year smoking history. He does not have any smokeless tobacco history on file. He reports that he drinks alcohol. He reports that he does not use illicit drugs.   Family History:  The patient's family history includes Alcohol abuse in his other; Breast cancer in his other; Diabetes in his father and mother; Hypertension in his father and mother.    ROS:  General:no colds or fevers, + weight changes continues to decrease Skin:no rashes or ulcers HEENT:no blurred vision, no congestion CV:see HPI PUL:see HPI GI:no diarrhea constipation or melena, no indigestion GU:no hematuria, no dysuria MS:no joint pain, no claudication Neuro:no syncope, no lightheadedness Endo:no diabetes, no thyroid disease  Wt Readings from Last 3 Encounters:  03/28/15 172 lb (78.019 kg)  03/26/15 204 lb (92.534 kg)  03/25/15 204 lb (92.534 kg)     PHYSICAL EXAM: VS:  BP 112/70 mmHg  Pulse 86  Ht 6' (1.829 m) , BMI There is no weight on file to calculate BMI. General:Pleasant affect, NAD, thin Skin:Warm and dry, brisk capillary refill HEENT:normocephalic, sclera clear, mucus membranes moist Neck:supple, no JVD, no bruits  Heart:irreg irreg without murmur, gallup, rub or click Lungs:clear without rales, rhonchi, or wheezes [id number], non tender, + BS, do not palpate liver spleen or masses Ext:no lower ext edema, legs wrapped, 2+ radial pulses Neuro:alert and oriented X 3, MAE, follows commands, + facial symmetry    EKG:  EKG is ordered today. The ekg ordered today demonstrates a flutter with PVCs no acute changes and RVH    Recent Labs: 03/11/2015: ALT 9; Pro B Natriuretic peptide (BNP) 839.0* 03/13/2015: TSH 1.571 03/15/2015: B Natriuretic Peptide 1484.5* 03/16/2015: Magnesium 2.1 03/22/2015: BUN 27*; Creatinine, Ser 1.44*; Hemoglobin 9.7*; Platelets 141*; Potassium 3.5; Sodium 138    Lipid Panel    Component Value Date/Time   CHOL 116 09/18/2013 1402   TRIG 41.0 09/18/2013 1402   TRIG 81  01/10/2008   HDL 61.50 09/18/2013 1402   CHOLHDL 2 09/18/2013 1402   VLDL 8.2 09/18/2013 1402   LDLCALC 46 09/18/2013 1402       Other studies Reviewed: Additional studies/ records that were reviewed today include:  ECHO: Study Conclusions  - Left ventricle: The cavity size was normal. There was mild concentric hypertrophy. Systolic function was normal. The estimated ejection fraction was in the range of 50% to 55%. The study is not technically sufficient to allow evaluation of LV diastolic function. - Aortic valve: There was moderate regurgitation. - Mitral valve: Calcified annulus. Mildly thickened leaflets . - Left atrium: The atrium was moderately dilated. - Right ventricle: The cavity  size was moderately dilated. Systolic function was moderately reduced. - Right atrium: The atrium was moderately dilated. - Atrial septum: No defect or patent foramen ovale was identified. - Tricuspid valve: There was mild-moderate regurgitation directed centrally. - Pulmonary arteries: Systolic pressure was moderately increased. PA peak pressure: 52 mm Hg (S). - Pericardium, extracardiac: A moderate, free-flowing pericardial effusion was identified posterior to the heart and circumferential to the heart. The fluid had no internal echoes.There was no evidence of hemodynamic compromise. There was a left pleural effusion.  Impressions:  - Incessant PVCs make assessment of LV function and wall motion challenging.  FOLLOW UP ECHO FOR TODAY: Larger pericardial effusion- no tamponade, IVC 1.95 will wait for formal reading. But moderate effusion 03/22/15  ASSESSMENT AND PLAN:  1.  S/p pericardiocentesis 2/7/`7 of 1800 cc total with drain.  Echo done today.  Preliminary with increase of effusion.  Briefly discussed with pt- he is concerned about his survival with pericardial window.   Will discuss with Dr. Acie Fredrickson.   2.  Acute on chronic diastolic hf  -0000000 on  hospitalization  80 lasix mg BID  Wt 174 at discharge now down to 154.   3. CAF with CHA2DS2VASc of 5-- today he is on Eliquis.    4.  CKD 3  5. anemia  6.  Moderate pulmonary hypertension  7. Moderate aortic insufficiency  Current medicines are reviewed with the patient today.  The patient Has no concerns regarding medicines.  The following changes have been made:  See above Labs/ tests ordered today include:see above  Disposition:   FU:  see above  Signed, Isaiah Serge, NP  04/02/2015 8:26 AM    [facility] [place], [place], [place], [place] Phone: [phone]; Fax: [phone]

## 2015-04-02 ENCOUNTER — Other Ambulatory Visit: Payer: Self-pay

## 2015-04-02 ENCOUNTER — Ambulatory Visit (INDEPENDENT_AMBULATORY_CARE_PROVIDER_SITE_OTHER): Payer: Medicare Other | Admitting: Cardiology

## 2015-04-02 ENCOUNTER — Encounter: Payer: Self-pay | Admitting: Cardiology

## 2015-04-02 ENCOUNTER — Ambulatory Visit (HOSPITAL_COMMUNITY): Payer: No Typology Code available for payment source | Attending: Cardiology

## 2015-04-02 VITALS — BP 112/70 | HR 86 | Ht 72.0 in | Wt 154.0 lb

## 2015-04-02 DIAGNOSIS — I319 Disease of pericardium, unspecified: Secondary | ICD-10-CM

## 2015-04-02 DIAGNOSIS — I517 Cardiomegaly: Secondary | ICD-10-CM | POA: Insufficient documentation

## 2015-04-02 DIAGNOSIS — I509 Heart failure, unspecified: Secondary | ICD-10-CM | POA: Diagnosis not present

## 2015-04-02 DIAGNOSIS — I482 Chronic atrial fibrillation, unspecified: Secondary | ICD-10-CM

## 2015-04-02 DIAGNOSIS — I4819 Other persistent atrial fibrillation: Secondary | ICD-10-CM

## 2015-04-02 DIAGNOSIS — I3139 Other pericardial effusion (noninflammatory): Secondary | ICD-10-CM

## 2015-04-02 DIAGNOSIS — I313 Pericardial effusion (noninflammatory): Secondary | ICD-10-CM

## 2015-04-02 DIAGNOSIS — I351 Nonrheumatic aortic (valve) insufficiency: Secondary | ICD-10-CM | POA: Insufficient documentation

## 2015-04-02 DIAGNOSIS — I481 Persistent atrial fibrillation: Secondary | ICD-10-CM

## 2015-04-02 LAB — CBC WITH DIFFERENTIAL/PLATELET
BASOS ABS: 0.1 10*3/uL (ref 0.0–0.1)
Basophils Relative: 1 % (ref 0–1)
EOS ABS: 0.2 10*3/uL (ref 0.0–0.7)
EOS PCT: 3 % (ref 0–5)
HEMATOCRIT: 36.1 % — AB (ref 39.0–52.0)
Hemoglobin: 11.8 g/dL — ABNORMAL LOW (ref 13.0–17.0)
LYMPHS ABS: 1.1 10*3/uL (ref 0.7–4.0)
LYMPHS PCT: 14 % (ref 12–46)
MCH: 29.5 pg (ref 26.0–34.0)
MCHC: 32.7 g/dL (ref 30.0–36.0)
MCV: 90.3 fL (ref 78.0–100.0)
MONOS PCT: 5 % (ref 3–12)
MPV: 9.5 fL (ref 8.6–12.4)
Monocytes Absolute: 0.4 10*3/uL (ref 0.1–1.0)
NEUTROS PCT: 77 % (ref 43–77)
Neutro Abs: 5.9 10*3/uL (ref 1.7–7.7)
PLATELETS: 373 10*3/uL (ref 150–400)
RBC: 4 MIL/uL — ABNORMAL LOW (ref 4.22–5.81)
RDW: 15.7 % — ABNORMAL HIGH (ref 11.5–15.5)
WBC: 7.7 10*3/uL (ref 4.0–10.5)

## 2015-04-02 LAB — BASIC METABOLIC PANEL
BUN: 37 mg/dL — AB (ref 7–25)
CHLORIDE: 100 mmol/L (ref 98–110)
CO2: 23 mmol/L (ref 20–31)
CREATININE: 1.66 mg/dL — AB (ref 0.70–1.11)
Calcium: 9 mg/dL (ref 8.6–10.3)
Glucose, Bld: 174 mg/dL — ABNORMAL HIGH (ref 65–99)
POTASSIUM: 4.7 mmol/L (ref 3.5–5.3)
Sodium: 135 mmol/L (ref 135–146)

## 2015-04-02 NOTE — Patient Instructions (Addendum)
Medication Instructions:  Your physician recommends that you continue on your current medications as directed. Please refer to the Current Medication list given to you today.   Labwork: TODAY:  BMET & CBC W/DIFF  Testing/Procedures: NONE ORDERED  Follow-Up: Your physician recommends that you schedule a follow-up appointment in:  2 WEEKS WITH DR. Acie Fredrickson ONLY  Any Other Special Instructions Will Be Listed Below (If Applicable).   If you need a refill on your cardiac medications before your next appointment, please call your pharmacy.

## 2015-04-03 ENCOUNTER — Encounter: Payer: Self-pay | Admitting: *Deleted

## 2015-04-03 ENCOUNTER — Other Ambulatory Visit: Payer: Self-pay | Admitting: *Deleted

## 2015-04-03 ENCOUNTER — Telehealth: Payer: Self-pay | Admitting: Cardiology

## 2015-04-03 NOTE — Patient Outreach (Signed)
[facility] ([facility]  04/03/2015  Luis Glenn 06/17/1927 [id number]   CSW was able to make contact with patient today to perform the initial assessment, as well as assess and assist with social work needs and services.  CSW met with patient at [facility] where patient currently resides to receive short-term rehabilitative services.  CSW introduced self, explained role and types of services provided through [facility] ([facility]).  CSW further explained to patient that CSW works with patient's [name], also with [facility] Care Management, Victoria Brewer. CSW then explained the reason for the call, indicating that Mrs. Brewer thought that patient would benefit from social work services and resources to assist with possible discharge planning needs and services.  CSW obtained two HIPAA compliant identifiers from patient, which included patient's name and date of birth. Patient admitted that he has been working very well with therapies (both physical and occupational) and that he is hopeful to be discharged home within the next few days.  However, patient was not pleased to inform CSW that he may need to have another surgical procedure to remove all the fluid around his heart. CSW will converse with Lorraine Ajel, [name] with [facility], to report findings of initial visit with patient today.  Patient is extremely alert, oriented and aware of his medical diagnoses and symptoms associated with each.  Patient admits that he is concerned about his survival, after being told yesterday that he has a larger Pericardial Effusion.  Patient reported that he will await proper recommendations from his medical professionals, with regards to future procedures.  CSW offered counseling and supportive services, where appropriate. CSW will fax a barriers letter and a correspondence letter to patient's Primary  Care Physician, Dr. Stacy Burns to ensure that Dr. Burns is aware of CSW's involvement with patient's care. CSW will prescribe and print EMMI to review with patient at the next scheduled visit. Joanna Saporito, BSW, MSW, LCSW  Licensed Clinical Social Worker  [facility]  System  Mailing Address-[address], [place], [place] [address] Physical Address-[address], [place], [place] [address] Toll Free Main # [phone] Fax # [phone] Cell # [phone]  Fax # [phone]  [email or web address]         

## 2015-04-03 NOTE — Telephone Encounter (Signed)
I called pt after discussing pericardial effusion with Dr. Acie Fredrickson.  Plan as pt has several issues to discuss with Dr. Acie Fredrickson he will see him 04/19/15 as planned.  We will repeat echo in 1 month to eval effusion.  Pt instructed if increasing SOB to call us.

## 2015-04-04 ENCOUNTER — Other Ambulatory Visit: Payer: Self-pay | Admitting: *Deleted

## 2015-04-04 DIAGNOSIS — I3139 Other pericardial effusion (noninflammatory): Secondary | ICD-10-CM

## 2015-04-04 DIAGNOSIS — I313 Pericardial effusion (noninflammatory): Secondary | ICD-10-CM

## 2015-04-04 LAB — BASIC METABOLIC PANEL
BUN: 44 mg/dL — AB (ref 4–21)
Creatinine: 1.6 mg/dL — AB (ref 0.6–1.3)
Glucose: 85 mg/dL
Potassium: 5.1 mmol/L (ref 3.4–5.3)
Sodium: 138 mmol/L (ref 137–147)

## 2015-04-08 ENCOUNTER — Telehealth: Payer: Self-pay | Admitting: Cardiovascular Disease

## 2015-04-08 NOTE — Telephone Encounter (Signed)
Spoke with patient who states someone from our office named Anderson Malta called on Friday.  I reviewed lab results with him which I believe was the reason for Jennifer's call.  He states he is feeling pretty well and thinks that follow-up with Dr. Acie Fredrickson next week is sufficient.  I advised him to call back with questions or concerns and he verbalized understanding and agreement.

## 2015-04-08 NOTE — Telephone Encounter (Signed)
Returning a call. FYI Voice on phone is not working .Marland Kitchen

## 2015-04-09 ENCOUNTER — Encounter: Payer: Self-pay | Admitting: Adult Health

## 2015-04-09 ENCOUNTER — Non-Acute Institutional Stay (SKILLED_NURSING_FACILITY): Payer: Medicare Other | Admitting: Adult Health

## 2015-04-09 DIAGNOSIS — N183 Chronic kidney disease, stage 3 unspecified: Secondary | ICD-10-CM

## 2015-04-09 DIAGNOSIS — E876 Hypokalemia: Secondary | ICD-10-CM

## 2015-04-09 DIAGNOSIS — I319 Disease of pericardium, unspecified: Secondary | ICD-10-CM

## 2015-04-09 DIAGNOSIS — K5900 Constipation, unspecified: Secondary | ICD-10-CM

## 2015-04-09 DIAGNOSIS — I481 Persistent atrial fibrillation: Secondary | ICD-10-CM | POA: Diagnosis not present

## 2015-04-09 DIAGNOSIS — M109 Gout, unspecified: Secondary | ICD-10-CM | POA: Diagnosis not present

## 2015-04-09 DIAGNOSIS — I3139 Other pericardial effusion (noninflammatory): Secondary | ICD-10-CM

## 2015-04-09 DIAGNOSIS — N400 Enlarged prostate without lower urinary tract symptoms: Secondary | ICD-10-CM

## 2015-04-09 DIAGNOSIS — E785 Hyperlipidemia, unspecified: Secondary | ICD-10-CM

## 2015-04-09 DIAGNOSIS — I5033 Acute on chronic diastolic (congestive) heart failure: Secondary | ICD-10-CM | POA: Diagnosis not present

## 2015-04-09 DIAGNOSIS — M79606 Pain in leg, unspecified: Secondary | ICD-10-CM | POA: Diagnosis not present

## 2015-04-09 DIAGNOSIS — I313 Pericardial effusion (noninflammatory): Secondary | ICD-10-CM

## 2015-04-09 DIAGNOSIS — R5381 Other malaise: Secondary | ICD-10-CM

## 2015-04-09 DIAGNOSIS — I4819 Other persistent atrial fibrillation: Secondary | ICD-10-CM

## 2015-04-09 DIAGNOSIS — F419 Anxiety disorder, unspecified: Secondary | ICD-10-CM

## 2015-04-09 DIAGNOSIS — J309 Allergic rhinitis, unspecified: Secondary | ICD-10-CM

## 2015-04-09 LAB — BASIC METABOLIC PANEL
BUN: 73 mg/dL — AB (ref 4–21)
Creatinine: 2 mg/dL — AB (ref 0.6–1.3)
GLUCOSE: 92 mg/dL
POTASSIUM: 4.6 mmol/L (ref 3.4–5.3)
SODIUM: 139 mmol/L (ref 137–147)

## 2015-04-09 NOTE — Progress Notes (Signed)
Patient ID: JIYAAN HENNINGSON, male   DOB: 07-03-1927, 82 y.o.   MRN: [id number]    DATE:  04/09/2015   MRN:  [id number]  BIRTHDAY: [birth year]  Facility:  Nursing Home Location:  [place] Room Number: 509-P  LEVEL OF CARE:  SNF [phone])  Contact Information    Name Relation Home Work Mobile   Beaston,Juliette Daughter [phone]  [phone]   Marilynn Rail   [phone]       Code Status History    Date Active Date Inactive Code Status Order ID Comments User Context   03/12/2015 11:46 PM 03/22/2015  7:11 PM Full Code [id number]  Norval Morton, MD Inpatient       Chief Complaint  Patient presents with  . Discharge Note    HISTORY OF PRESENT ILLNESS:  This is an 82 year old male who is for discharge home with Home health skilled Nurse for wound care, CNA for showers, PT for endurance and OT for ADLs. DME: 3-in-1 commode . He has been admitted to [facility] on 03/22/15 from [facility]. He has PMH of hypertension, PVD, hyperlipidemia, diabetes mellitus type 2, depression, BPH, pericardial effusion and chronic lower extremity edema. He was having worsening swelling of his left leg. Left leg had a blister. It popped and left an open wound on his leg. He also noticed increasing SOB. He was diagnosed with large pericardial effusion without tamponade physiology. He had pericardiocentesis on 2/7, 1500 mL remove and the drain was eventually removed on 03/21/15. Cardiology, Dr. Burt Knack, felt patient was stable for  discharge with Lasix po.   Latest creatinine is 2.02, trended up from 1.44.   Patient was admitted to this facility for short-term rehabilitation after the patient's recent hospitalization.  Patient has completed SNF rehabilitation and therapy has cleared the patient for discharge.   PAST MEDICAL HISTORY:  Past Medical History  Diagnosis Date  . GOUT   . PERIPHERAL VASCULAR DISEASE   . DIVERTICULOSIS, COLON   . BENIGN PROSTATIC  HYPERTROPHY   . DEPRESSION   . DIABETES MELLITUS, TYPE II   . GERD   . HYPERLIPIDEMIA   . HYPERTENSION   . CATARACTS   . OSTEOARTHRITIS   . ANXIETY   . ALLERGIC RHINITIS   . Pericardial effusion      CURRENT MEDICATIONS: Reviewed    Medication List       This list is accurate as of: 04/09/15  5:01 PM.  Always use your most recent med list.               acetaminophen 500 MG tablet  Commonly known as:  TYLENOL  Take 500 mg by mouth 4 (four) times daily - after meals and at bedtime. Take 4 times every day per patient     allopurinol 300 MG tablet  Commonly known as:  ZYLOPRIM  Take 1 tablet (300 mg total) by mouth daily.     ALPRAZolam 0.5 MG tablet  Commonly known as:  XANAX  Take 1 tablet (0.5 mg total) by mouth 3 (three) times daily as needed for anxiety.     apixaban 2.5 MG Tabs tablet  Commonly known as:  ELIQUIS  Take 1 tablet (2.5 mg total) by mouth 2 (two) times daily.     bisacodyl 10 MG suppository  Commonly known as:  DULCOLAX  Place 10 mg rectally as needed for moderate constipation.     fluticasone 50 MCG/ACT nasal spray  Commonly known as:  FLONASE  Place 2 sprays into both nostrils daily as needed for allergies or rhinitis.     furosemide 20 MG tablet  Commonly known as:  LASIX  Take 20 mg by mouth. Take  3 tabs = 60 mg daily     LINZESS 145 MCG Caps capsule  Generic drug:  Linaclotide  Take 145 mcg by mouth daily.     oxyCODONE 5 MG immediate release tablet  Commonly known as:  Oxy IR/ROXICODONE  Take 1 tablet (5 mg total) by mouth every 6 (six) hours as needed for severe pain.     polyethylene glycol packet  Commonly known as:  MIRALAX / GLYCOLAX  Take 17 g by mouth 2 (two) times daily.     potassium chloride SA 20 MEQ tablet  Commonly known as:  K-DUR,KLOR-CON  Take 20 mEq by mouth daily.     senna-docusate 8.6-50 MG tablet  Commonly known as:  Senokot-S  Take 1 tablet by mouth 2 (two) times daily.     simvastatin 40 MG tablet   Commonly known as:  ZOCOR  Take 1 tablet (40 mg total) by mouth daily at 6 PM.     spironolactone 25 MG tablet  Commonly known as:  ALDACTONE  Take 1 tablet (25 mg total) by mouth daily.     tamsulosin 0.4 MG Caps capsule  Commonly known as:  FLOMAX  TAKE ONE CAPSULE BY MOUTH DAILY          Allergies  Allergen Reactions  . Penicillins Swelling and Rash    Has patient had a PCN reaction causing immediate rash, facial/tongue/throat swelling, SOB or lightheadedness with hypotension: NO Has patient had a PCN reaction causing severe rash involving mucus membranes or skin necrosis:NO Has patient had a PCN reaction that required hospitalization No Has patient had a PCN reaction occurring within the last 10 years: NO If all of the above answers are "NO", then may proceed with Cephalosporin use.     REVIEW OF SYSTEMS:  GENERAL: no change in appetite, no fatigue, no weight changes, no fever, chills or weakness EYES: Denies change in vision, dry eyes, eye pain, itching or discharge EARS: Denies change in hearing, ringing in ears, or earache NOSE: Denies nasal congestion or epistaxis MOUTH and THROAT: Denies oral discomfort, gingival pain or bleeding, pain from teeth or hoarseness   RESPIRATORY: no cough, SOB, DOE, wheezing, hemoptysis CARDIAC: no chest pain,  or palpitations GI: no abdominal pain, diarrhea, constipation, heart burn, nausea or vomiting GU: Denies dysuria, frequency, hematuria, incontinence, or discharge PSYCHIATRIC: Denies feeling of depression or anxiety. No report of hallucinations, insomnia, paranoia, or agitation   PHYSICAL EXAMINATION  GENERAL APPEARANCE: Well nourished. In no acute distress. Normal body habitus SKIN:  Left leg wound has healed , bilateral legs covered with unna boot HEAD: Normal in size and contour. No evidence of trauma EYES: Lids open and close normally. No blepharitis, entropion or ectropion. PERRL. Conjunctivae are clear and sclerae are  white. Lenses are without opacity EARS: Pinnae are normal. Patient hears normal voice tunes of the examiner MOUTH and THROAT: Lips are without lesions. Oral mucosa is moist and without lesions. Tongue is normal in shape, size, and color and without lesions NECK: supple, trachea midline, no neck masses, no thyroid tenderness, no thyromegaly LYMPHATICS: no LAN in the neck, no supraclavicular LAN RESPIRATORY: breathing is even & unlabored, BS CTAB CARDIAC: RRR, no murmur,no extra heart sounds, LLE edema 1+ GI: abdomen soft, normal BS, no masses, no  tenderness, no hepatomegaly, no splenomegaly EXTREMITIES:  Able to move 4 extremities; BLE wrapped with unna boot PSYCHIATRIC: Alert and oriented X 3. Affect and behavior are appropriate  LABS/RADIOLOGY: Labs reviewed: 04/09/15  sodium 139 potassium 4.6 glucose 92 BUN 73 creatinine 2.0 to calcium 9.2 WBC 6.8 hemoglobin 10.6 hematocrit 33.7 MCV 93.1 platelet 0000000 Basic Metabolic Panel:  Recent Labs  03/16/15 0320  03/20/15 0813  03/22/15 0033 03/26/15 04/02/15 0907 04/04/15  NA 142  < > 144  < > 138 141 135 138  K 3.8  < > 3.5  --  3.5 4.6 4.7 5.1  CL 105  < > 101  --  97*  --  100  --   CO2 26  < > 31  --  31  --  23  --   GLUCOSE 98  < > 93  --  134*  --  174*  --   BUN 27*  < > 23*  < > 27* 34* 37* 44*  CREATININE 1.55*  < > 1.45*  < > 1.44* 1.4* 1.66* 1.6*  CALCIUM 8.9  < > 8.8*  --  8.4*  --  9.0  --   MG 2.1  --   --   --   --   --   --   --   < > = values in this interval not displayed. Liver Function Tests:  Recent Labs  10/17/14 1235 03/11/15 1725  AST 14 16  ALT 7 9  ALKPHOS 107 107  BILITOT 1.1 0.6  PROT 7.0 6.0  ALBUMIN 4.3 3.4*    Recent Labs  03/19/15 1450  AMYLASE 19*   CBC:  Recent Labs  03/12/15 1934  03/21/15 0325  03/22/15 0033 03/26/15 04/02/15 0907  WBC 4.9  < > 9.8  < > 9.4 6.8 7.7  NEUTROABS 3.8  --   --   --   --  4 5.9  HGB 11.5*  < > 10.2*  --  9.7* 10.6* 11.8*  HCT 36.7*  < > 32.7*  --   30.0* 34* 36.1*  MCV 92.7  < > 91.9  --  91.5  --  90.3  PLT 184  < > 132*  --  141* 278 373  < > = values in this interval not displayed.  Cardiac Enzymes:  Recent Labs  03/13/15 0045 03/13/15 0545 03/13/15 1147  TROPONINI <0.03 <0.03 <0.03     Dg Chest Port 1 View  03/12/2015  CLINICAL DATA:  Shortness of breath and known pericardial effusion. EXAM: PORTABLE CHEST 1 VIEW COMPARISON:  CT 07/11/2014 FINDINGS: Single view of the chest again demonstrates enlargement of the cardiac silhouette. Findings are compatible with a large amount of pericardial fluid. In addition, there are prominent central vascular structures and concern for some pulmonary edema. Limited evaluation of the left lung base due to the enlarged cardiac silhouette. IMPRESSION: Enlarged cardiac silhouette related to a large pericardial effusion. Evidence for vascular congestion or mild pulmonary edema. Electronically Signed   By: Markus Daft M.D.   On: 03/12/2015 19:06    ASSESSMENT/PLAN:  Physical deconditioning - for home health skilled Nurse for wound care, CNA for showers, PT for endurance and OT for ADLs   Pericardial effusion S/P pericardiocentesis - latest creatinine is 2.02 (was 1.44 on 03/26/15); will decrease Lasix to 60 mg daily; follow-up with Dr. Acie Fredrickson in; for repeat echo in 1-2 weeks  Acute on chronic diastolic CHF - decrease Lasix to 60 mg PO  daily and Aldactone 25 mg 1 tab daily  Atrial fibrillation - rate controlled; continue Eliquis 2.5 mg 1 tab by mouth twice a day  Chronic kidney disease, stage III - creatinine 1.44; re-check creatinine 2.02; BMP on 04/12/15, patient will male appointment with PCP; will fax latest BMP to cardiologist, Dr. Acie Fredrickson, and [name], PCP  Hyperlipidemia - continue Zocor 40 mg 1 tab by mouth every 6 p.m.  BPH - continue Flomax 0.4 mg 1 capsule by mouth daily  Constipation - increase MiraLAX to 17 g by mouth twice a day and start senna S 2 tabs by mouth twice a  day  Anxiety - mood is stable; continue Xanax 0.5 mg 1 tab by mouth 3 times a day when necessary  Lower extremity pain - continue oxycodone 5 mg 1 tab by mouth every 6 hours when necessary  Hypokalemia - K4.6 ;decrease  KCl 20 meq 1 tab by mouth daily  Gout - continue allopurinol 300 mg 1 tab by mouth daily  Allergic rhinitis - continue Flonase 50 g 2 sprays to both nostrils daily when necessary      I have filled out patient's discharge paperwork and written prescriptions.  Patient will receive home health PT, OT, ST, skilled Nurse and CNA.  DME provided: 3-in-1 bedside commode  Total discharge time: Greater than 30 minutes  Discharge time involved coordination of the discharge process with social worker, nursing staff and therapy department. Medical justification for home health services/DME verified.    [facility], NP [employer] [phone]

## 2015-04-10 ENCOUNTER — Encounter: Payer: Self-pay | Admitting: Cardiology

## 2015-04-11 ENCOUNTER — Telehealth: Payer: Self-pay | Admitting: *Deleted

## 2015-04-11 ENCOUNTER — Other Ambulatory Visit: Payer: Self-pay | Admitting: *Deleted

## 2015-04-11 ENCOUNTER — Encounter: Payer: Self-pay | Admitting: *Deleted

## 2015-04-11 DIAGNOSIS — I10 Essential (primary) hypertension: Secondary | ICD-10-CM

## 2015-04-11 NOTE — Telephone Encounter (Signed)
-----   Message from Isaiah Serge, NP sent at 04/10/2015  5:18 PM EST ----- Have pt decrease his lasix to 20 mg daily- he has 20 mg tabs and is taking 3 daily.  Stop potassium and repeat BMP Friday stat. If he develops increasing SOB or has now let me know. thanks

## 2015-04-11 NOTE — Telephone Encounter (Signed)
Pt has been made aware of his lab results and the change in medications. He will repeat bmp 04/16/15. Order in epic

## 2015-04-11 NOTE — Patient Outreach (Signed)
[facility]) Care Management  04/11/2015  Luis Glenn [birth year] [id number]   CSW was able to make contact with patient today, after learning that patient was discharged from [facility] [place] where patient was residing to receive short-term rehabilitative services.  Patient was able to return home to live independently, with home health services arranged.  These home health services include a nurse for wound care and dressing changes, a certified nursing assistant for showers, physical therapy for endurance and occupational therapy for assistance with activities of daily living.  Patient will also receive the following pieces of durable medical equipment:  3-in-1 commode.  Patient reports no additional social work needs at present. CSW will perform a case closure on patient, as all goals of treatment have been met from social work standpoint and no additional social work needs have been identified at this time. CSW will notify patient's [name] with [facility], Luis Glenn of CSW's plans to close patient's case. CSW will fax a correspondence letter to patient's Primary Care Physician, Dr. Billey Glenn to ensure that Dr. Quay Glenn is aware of CSW's case closure plans.   CSW will submit a case closure request to Luis Glenn, Care Management Assistant with [facility], in the form of an [employer].  CSW will ensure that Luis Glenn is aware of Luis Glenn, [name] with [facility], continued involvement with patient's care.  Luis Glenn, BSW, MSW, LCSW  Licensed Education officer, environmental Health System  Mailing [place] [address], [place], [place] [address] Physical Address-[address], [place],  [address] Toll Free Main # [phone] Fax # [phone] Cell # [phone]  Fax # [phone]   [email or web address]   Patient's preferred language:  [place]   English  ATTENTION:  If you speak [place], language assistance services, free of charge, are available to you.    Nondiscrimination and Accessibility Statement: Discrimination is Against the [employer], a subsidiary of [employer], complies with [employer] civil rights laws and does not discriminate on the basis of race, color, national origin, age, disability, or sex.  [facility] does not exclude people or treat them differently because of race, color, national origin, age, disability, or sex.  [facility] Providers will:  . Provide free aids and services to people with disabilities to communicate effectively with [place], such as:     ? Qualified sign language interpreters  ? Written information in other formats (large print, audio, accessible electronic formats, other formats)   . Provide free language services to people whose primary language is not [place], such as:    ? Qualified interpreters    ? Information written in other languages   If you need these services, contact your [facility] Fore[facility] psychologist.  If you believe that a [facility] [employer] has failed to provide these services or discriminated in another way on the basis of race, color, national origin, age, disability, or sex, you Glenn file a Tourist information centre manager with: [place], [phone] or [email or web address].info/.  You Glenn file a grievance in person or by mail, fax, or email. If you need help filing a grievance, you may contact Valrie Hart, Interim Compliance Officer, [facility] Department of Compliance and Integrity, [place] Floor, [place], [place]. [place], [phone], [email or web address].    You Glenn also file a civil rights complaint with  the U.S. Department of Health and Financial controller,  Office for [employer], electronically through the Office for Civil Rights Complaint Portal, available at OnSiteLending.nl.jsf, or by mail or phone at:  [place]. Department of Health and Human Services [address] [place] Room [phone], [facility] Building [place], [place]  [phone], [phone] (TDD) Complaint forms are available at CutFunds.si.

## 2015-04-12 ENCOUNTER — Other Ambulatory Visit: Payer: Self-pay | Admitting: *Deleted

## 2015-04-12 DIAGNOSIS — I509 Heart failure, unspecified: Secondary | ICD-10-CM | POA: Diagnosis not present

## 2015-04-12 DIAGNOSIS — I739 Peripheral vascular disease, unspecified: Secondary | ICD-10-CM | POA: Diagnosis not present

## 2015-04-12 DIAGNOSIS — L03115 Cellulitis of right lower limb: Secondary | ICD-10-CM | POA: Diagnosis not present

## 2015-04-12 DIAGNOSIS — L03116 Cellulitis of left lower limb: Secondary | ICD-10-CM | POA: Diagnosis not present

## 2015-04-12 DIAGNOSIS — L97211 Non-pressure chronic ulcer of right calf limited to breakdown of skin: Secondary | ICD-10-CM | POA: Diagnosis not present

## 2015-04-12 DIAGNOSIS — I872 Venous insufficiency (chronic) (peripheral): Secondary | ICD-10-CM | POA: Diagnosis not present

## 2015-04-12 NOTE — Patient Outreach (Signed)
[facility]) Care Management  04/12/2015  Luis Glenn Oct 31, 1927 [id number]    Assessment: Transition of care- week 6  82 year old male with recent discharge from [facility] facility after hospitalization on 1/31-2/10/17 for cellulitis of left lower extremity and congestive heart failure.  Transition of care call completed. Patient reports he is feeling "pretty good". He denies further swelling to left lower extremity. Patient lives alone. Patient has good understanding of medications and hospital stay. He states he is on low salt diet but having difficulty with it.  Patient states he has the medication supplies needed. Patient states he started to check his weight daily and records. Weight yesterday was 150.6 pounds and today was 152 pounds as reported. Encouraged patient to monitor for weight gain of 3 pounds overnight or 5 pounds in a week and notify provider if any.  According to patient, [facility] health nurse is coming in once a week to see him. He has home aide 3 times a week set-up by daughter with Luis Glenn to assist patient with care and activities of daily living.   Patient shares that he has a scheduled follow-up visit with primary care provider on 04/15/15. He has a scheduled follow up appointment with cardiology (Dr. Cathie Olden) on 04/19/15.  Patient reports he does have transportation to his follow-up appointment set-up by daughter Luis Glenn).  Patient denies urgent needs at this time. He prefers to be called next week for follow-up. Patient noted to be easily irritated. Encouraged him to call [facility], care management coordinator or 24-hour nurse line as needed. Contact informations with patient.   Plan: Transition of care call on 04/17/15.  Luis Glenn A. Luis Glenn, BSN, RN-BC [place] Management Coordinator Cell: [phone]

## 2015-04-15 ENCOUNTER — Encounter: Payer: Self-pay | Admitting: Internal Medicine

## 2015-04-15 ENCOUNTER — Telehealth: Payer: Self-pay | Admitting: Emergency Medicine

## 2015-04-15 ENCOUNTER — Ambulatory Visit (INDEPENDENT_AMBULATORY_CARE_PROVIDER_SITE_OTHER): Payer: Medicare Other | Admitting: Internal Medicine

## 2015-04-15 ENCOUNTER — Other Ambulatory Visit (INDEPENDENT_AMBULATORY_CARE_PROVIDER_SITE_OTHER): Payer: Medicare Other

## 2015-04-15 ENCOUNTER — Telehealth: Payer: Self-pay | Admitting: Internal Medicine

## 2015-04-15 VITALS — BP 128/56 | HR 83 | Temp 97.6°F | Resp 18 | Wt 161.0 lb

## 2015-04-15 DIAGNOSIS — N189 Chronic kidney disease, unspecified: Principal | ICD-10-CM

## 2015-04-15 DIAGNOSIS — N183 Chronic kidney disease, stage 3 unspecified: Secondary | ICD-10-CM

## 2015-04-15 DIAGNOSIS — D631 Anemia in chronic kidney disease: Secondary | ICD-10-CM

## 2015-04-15 DIAGNOSIS — I5033 Acute on chronic diastolic (congestive) heart failure: Secondary | ICD-10-CM

## 2015-04-15 DIAGNOSIS — I10 Essential (primary) hypertension: Secondary | ICD-10-CM

## 2015-04-15 LAB — BASIC METABOLIC PANEL
BUN: 96 mg/dL (ref 6–23)
CALCIUM: 9.8 mg/dL (ref 8.4–10.5)
CHLORIDE: 103 meq/L (ref 96–112)
CO2: 20 meq/L (ref 19–32)
Creatinine, Ser: 2.25 mg/dL — ABNORMAL HIGH (ref 0.40–1.50)
GFR: 29.55 mL/min — ABNORMAL LOW (ref >60.00)
Glucose, Bld: 113 mg/dL — ABNORMAL HIGH (ref 70–99)
POTASSIUM: 5.5 meq/L — AB (ref 3.5–5.1)
SODIUM: 134 meq/L — AB (ref 135–145)

## 2015-04-15 NOTE — Progress Notes (Signed)
Subjective:    Patient ID: Luis Glenn, male    DOB: [birth year], 82 y.o.   MRN: [id number]  HPI He is here with his daughter today.  He is out of rehabilitation and currently at home. While in the hospital he had significant amount of fluid removed through diuresis and pericardiocentesis. The fluid in his pericardium has reaccumulated. He has Unna boots on and this has prevented some of the swelling from reaccumulating in his legs. He was treated for cellulitis of his lower extremities. He was at rehabilitation and he did quite well. He noted that he was extremely hungry at rehabilitation any better than he had a long time. He also lost 20 pounds in rehabilitation, which he does not understand. He was very active with physical therapy. Since being home he has not been as active. He continues to eat well. He is doing his activities of daily living. He has gained 5.4 pounds from 3/2-3/7.  Cardiology has adjusted his medication. Last week they decreased his Lasix to 20 mg daily and his potassium was discontinued. They wanted him to have blood work done last Friday, but he was not able to get it done and he would like to have it done today.     Has someone coming in three times a week to help clean, etc.  his daughter is currently staying with him. She would like to have FMLA paperwork filled out in case she needs to leave her work to help him. She has brought paperwork with her today.   In the rehabilitation he was experiencing constipation. He typically does not have constipation at home and if he did he would take Dulcolax, which would result in bowel movement. They started him on Linzess as at rehabilitation and he does not want to continue it. He actually stopped taking it and is taking colace if needed and that has been effective. He did not feel that he needed this.  Medications and allergies reviewed with patient and updated if appropriate.  Patient Active Problem List   Diagnosis Date  Noted  . Lower extremity pain 03/25/2015  . Acute on chronic diastolic (congestive) heart failure ([place]) 03/18/2015  . Pericardial effusion without cardiac tamponade 03/18/2015  . Cellulitis of left lower extremity   . Chronic anticoagulation   . Dependent edema   . Chronic kidney disease, stage III (moderate) 03/13/2015  . Cellulitis 03/12/2015  . Cellulitis of left leg 03/12/2015  . Nausea without vomiting 11/12/2014  . Anemia 10/18/2014  . Renal insufficiency 10/17/2014  . Loss of weight 01/24/2014  . Pericardial effusion 10/18/2013  . Primary localized osteoarthrosis, lower leg 09/29/2013  . OSA (obstructive sleep apnea) 02/24/2013  . CHF (congestive heart failure) ([place])   . Atrial fibrillation ([place])   . IBS (irritable bowel syndrome) 06/24/2011  . Depression 03/19/2009  . DIVERTICULOSIS, COLON 03/19/2009  . OSTEOARTHRITIS 03/19/2009  . Hyperlipidemia 02/11/2009  . Allergic rhinitis 02/11/2009  . GERD 02/11/2009  . BPH (benign prostatic hyperplasia) 02/11/2009  . SHOULDER PAIN, LEFT, CHRONIC 02/11/2009  . GOUT 02/07/2009  . Essential hypertension 02/07/2009  . Anxiety 06/13/2004  . PERIPHERAL VASCULAR DISEASE 06/12/2003    Current Outpatient Prescriptions on File Prior to Visit  Medication Sig Dispense Refill  . acetaminophen (TYLENOL) 500 MG tablet Take 500 mg by mouth 4 (four) times daily - after meals and at bedtime. Take 4 times every day per patient    . allopurinol (ZYLOPRIM) 300 MG tablet Take 1 tablet (300  mg total) by mouth daily. 30 tablet 6  . ALPRAZolam (XANAX) 0.5 MG tablet Take 1 tablet (0.5 mg total) by mouth 3 (three) times daily as needed for anxiety. 20 tablet 0  . apixaban (ELIQUIS) 2.5 MG TABS tablet Take 1 tablet (2.5 mg total) by mouth 2 (two) times daily. 60 tablet 9  . bisacodyl (DULCOLAX) 10 MG suppository Place 10 mg rectally as needed for moderate constipation.    . fluticasone (FLONASE) 50 MCG/ACT nasal spray Place 2 sprays into both nostrils  daily as needed for allergies or rhinitis.    . furosemide (LASIX) 20 MG tablet Take 20 mg by mouth.     . Linaclotide (LINZESS) 145 MCG CAPS capsule Take 145 mcg by mouth daily.    Marland Kitchen oxyCODONE (OXY IR/ROXICODONE) 5 MG immediate release tablet Take 1 tablet (5 mg total) by mouth every 6 (six) hours as needed for severe pain. 20 tablet 0  . polyethylene glycol (MIRALAX / GLYCOLAX) packet Take 17 g by mouth 2 (two) times daily.    Marland Kitchen senna-docusate (SENOKOT-S) 8.6-50 MG tablet Take 1 tablet by mouth 2 (two) times daily.    . simvastatin (ZOCOR) 40 MG tablet Take 1 tablet (40 mg total) by mouth daily at 6 PM. 90 tablet 1  . spironolactone (ALDACTONE) 25 MG tablet Take 1 tablet (25 mg total) by mouth daily. 30 tablet 3  . tamsulosin (FLOMAX) 0.4 MG CAPS capsule TAKE ONE CAPSULE BY MOUTH DAILY 90 capsule 3   No current facility-administered medications on file prior to visit.    Past Medical History  Diagnosis Date  . GOUT   . PERIPHERAL VASCULAR DISEASE   . DIVERTICULOSIS, COLON   . BENIGN PROSTATIC HYPERTROPHY   . DEPRESSION   . DIABETES MELLITUS, TYPE II   . GERD   . HYPERLIPIDEMIA   . HYPERTENSION   . CATARACTS   . OSTEOARTHRITIS   . ANXIETY   . ALLERGIC RHINITIS   . Pericardial effusion     Past Surgical History  Procedure Laterality Date  . Tonsillectomy  10/1942  . Cardiac catheterization N/A 03/19/2015    Procedure: Pericardiocentesis;  Surgeon: Jettie Booze, MD;  Location: [facility];  Service: Cardiovascular;  Laterality: N/A;    Social History   Social History  . Marital Status: Widowed    Spouse Name: N/A  . Number of Children: N/A  . Years of Education: N/A   Occupational History  . Retired    Social History Main Topics  . Smoking status: Former Smoker -- 3.00 packs/day for 40 years    Types: Cigarettes    Quit date: 02/09/1989  . Smokeless tobacco: None  . Alcohol Use: Yes     Comment: occassional  . Drug Use: No  . Sexual Activity: Not  Asked   Other Topics Concern  . None   Social History Narrative   Widowed - wife [name] passed 07/2013    Family History  Problem Relation Age of Onset  . Diabetes Mother   . Hypertension Mother   . Diabetes Father   . Hypertension Father   . Alcohol abuse Other   . Breast cancer Other     Review of Systems  Constitutional: Negative for fever and appetite change.  Respiratory: Positive for shortness of breath. Negative for cough and wheezing.   Cardiovascular: Positive for leg swelling. Negative for chest pain and palpitations.  Gastrointestinal: Positive for constipation (intermittent). Negative for abdominal pain.  Neurological: Positive for headaches (  arthritis from neck). Negative for dizziness and light-headedness.       Objective:   Filed Vitals:   04/15/15 1126  BP: 128/56  Pulse: 83  Temp: 97.6 F (36.4 C)  Resp: 18   Filed Weights   04/15/15 1126  Weight: 161 lb (73.029 kg)   Body mass index is 20.66 kg/(m^2).   Physical Exam Constitutional: Appears well-developed and well-nourished. No distress.  Neck: Neck supple. No tracheal deviation present. No thyromegaly present.  No carotid bruit. No cervical adenopathy.   Cardiovascular: Normal rate, regular rhythm and normal heart sounds.   2/6 systolic murmur.  No edema seen - has UNA boots on, no peripheral edema Pulmonary/Chest: Effort normal and breath sounds normal. No respiratory distress. No wheezes.  Abdomen: soft, non-tender, nondistended      Assessment & Plan:   See Problem List for Assessment and Plan of chronic medical problems.  Follow up in 2 months

## 2015-04-15 NOTE — Telephone Encounter (Signed)
Lab called to inform that pts BUN was at 96.

## 2015-04-15 NOTE — Assessment & Plan Note (Signed)
BP controlled Was told to d/c hytrin, but has been taking it He will stop taking it and follow up with cardiology on Friday whether they want him on it or now BMP today

## 2015-04-15 NOTE — Progress Notes (Signed)
Pre visit review using our clinic review tool, if applicable. No additional management support is needed unless otherwise documented below in the visit note. 

## 2015-04-15 NOTE — Patient Instructions (Signed)
Go down and get blood work today.  I will forward the results to your cardiologist and call you with the results.

## 2015-04-15 NOTE — Telephone Encounter (Signed)
Let him know his kidney function is worse. I have forwarded the results to cardiology and they likely will be calling him as well. Have him hold his spironolactone. He needs to get repeat blood work today if possible. Tomorrow at the latest.

## 2015-04-15 NOTE — Assessment & Plan Note (Signed)
Worsening kidney function last week - cardiology decreased his lasix dose BMP today - will forward to cardiology

## 2015-04-15 NOTE — Assessment & Plan Note (Signed)
No evidence of fluid overload on exam, but weight is slowing increasing Lasix dose decreased last week BMP today - cardiology has been adjusted lasix Has cardiology follow up this friday

## 2015-04-16 ENCOUNTER — Telehealth: Payer: Self-pay

## 2015-04-16 ENCOUNTER — Telehealth: Payer: Self-pay | Admitting: Cardiovascular Disease

## 2015-04-16 ENCOUNTER — Other Ambulatory Visit (INDEPENDENT_AMBULATORY_CARE_PROVIDER_SITE_OTHER): Payer: Medicare Other

## 2015-04-16 DIAGNOSIS — I509 Heart failure, unspecified: Secondary | ICD-10-CM | POA: Diagnosis not present

## 2015-04-16 DIAGNOSIS — L97211 Non-pressure chronic ulcer of right calf limited to breakdown of skin: Secondary | ICD-10-CM | POA: Diagnosis not present

## 2015-04-16 DIAGNOSIS — Z7689 Persons encountering health services in other specified circumstances: Secondary | ICD-10-CM

## 2015-04-16 DIAGNOSIS — L03115 Cellulitis of right lower limb: Secondary | ICD-10-CM | POA: Diagnosis not present

## 2015-04-16 DIAGNOSIS — N183 Chronic kidney disease, stage 3 (moderate): Secondary | ICD-10-CM | POA: Diagnosis not present

## 2015-04-16 DIAGNOSIS — I872 Venous insufficiency (chronic) (peripheral): Secondary | ICD-10-CM | POA: Diagnosis not present

## 2015-04-16 DIAGNOSIS — L03116 Cellulitis of left lower limb: Secondary | ICD-10-CM | POA: Diagnosis not present

## 2015-04-16 DIAGNOSIS — I739 Peripheral vascular disease, unspecified: Secondary | ICD-10-CM | POA: Diagnosis not present

## 2015-04-16 LAB — BASIC METABOLIC PANEL
BUN: 98 mg/dL — AB (ref 6–23)
CHLORIDE: 104 meq/L (ref 96–112)
CO2: 21 meq/L (ref 19–32)
CREATININE: 2.34 mg/dL — AB (ref 0.40–1.50)
Calcium: 9.4 mg/dL (ref 8.4–10.5)
GFR: 28.24 mL/min — ABNORMAL LOW (ref >60.00)
Glucose, Bld: 131 mg/dL — ABNORMAL HIGH (ref 70–99)
POTASSIUM: 5.4 meq/L — AB (ref 3.5–5.1)
Sodium: 136 mEq/L (ref 135–145)

## 2015-04-16 NOTE — Telephone Encounter (Signed)
Spoke with pt and and informed him that Dr. Acie Fredrickson would like for him to be seen tomorrow on our Flex schedule.  Pt denies any symptoms.  Pt had already taken Aldactone this morning prior to PCP reaching him so he will not take it tomorrow.  PCP contacted pt about labs this morning and he actually just got back from having labs redrawn. Scheduled pt with Bonney Leitz, PA-C tomorrow at 11:30AM.

## 2015-04-16 NOTE — Telephone Encounter (Signed)
pts daughter stated someone had just called her. Was that you.

## 2015-04-16 NOTE — Telephone Encounter (Signed)
complete

## 2015-04-16 NOTE — Telephone Encounter (Signed)
SPoke with pt to inform.  

## 2015-04-16 NOTE — Telephone Encounter (Signed)
FMLA paperwork received during Hospital FU appt with PCP. Noted per OV 04/15/2015.   Forwarding to PCP - we need some assistance filling out some of the pieces. Putting in PCP box. Please advise at your convenience.

## 2015-04-16 NOTE — Telephone Encounter (Signed)
New message ° ° ° ° °Returning a call to the nurse °

## 2015-04-16 NOTE — Telephone Encounter (Signed)
Per lab, critical lab value that has been repeated for accuracy:   BUN is 98 and creatinine is 2.34----please advise, thanks

## 2015-04-17 ENCOUNTER — Ambulatory Visit (INDEPENDENT_AMBULATORY_CARE_PROVIDER_SITE_OTHER): Payer: Medicare Other | Admitting: Physician Assistant

## 2015-04-17 ENCOUNTER — Encounter: Payer: Self-pay | Admitting: *Deleted

## 2015-04-17 ENCOUNTER — Other Ambulatory Visit: Payer: Self-pay | Admitting: *Deleted

## 2015-04-17 ENCOUNTER — Encounter: Payer: Self-pay | Admitting: Physician Assistant

## 2015-04-17 ENCOUNTER — Ambulatory Visit: Payer: Self-pay | Admitting: *Deleted

## 2015-04-17 VITALS — BP 140/50 | HR 62 | Ht 72.0 in | Wt 162.8 lb

## 2015-04-17 DIAGNOSIS — I482 Chronic atrial fibrillation, unspecified: Secondary | ICD-10-CM

## 2015-04-17 DIAGNOSIS — I509 Heart failure, unspecified: Secondary | ICD-10-CM | POA: Diagnosis not present

## 2015-04-17 DIAGNOSIS — I10 Essential (primary) hypertension: Secondary | ICD-10-CM | POA: Diagnosis not present

## 2015-04-17 NOTE — Telephone Encounter (Signed)
Jonelle Sidle stated that FMLA will be faxed back by Dr. Eilleen Kempf assistant.   Need FMLA after it is faxed to add a charge capture sheet to it.

## 2015-04-17 NOTE — Telephone Encounter (Signed)
LVM informing daughter that I have already spoke with pt to call back if needed.

## 2015-04-17 NOTE — Progress Notes (Signed)
Cardiology Office Note   Date:  04/17/2015   ID:  Luis Glenn, DOB 06-Jul-1927, MRN [id number]  PCP:  Binnie Rail, MD  Cardiologist:  Dr. Acie Fredrickson   Worsening renal function and pericardial effusion    History of Present Illness: Luis Glenn is a 82 y.o. male with a history of moderately large pericardial effusion, gout, HTN, HLD, DM II and h/o afib on eliquis who presents to clinic for evaluation of worsening renal function.   He was admitted to [facility] from 1/3-2/10/17 for acute on chronic diastolic CHF, worsening pericardial effusion without tamponade physiology. He underwent pericardiocentesis on 2/7 and 1500 mL were removed. Fluid was transudative, cultures negative, cytology negative, likely from CHF only. Initially the thought was for pericardial window and pt was see by Dr. Servando Snare. But due to his age it was decided to proceed with pericardiocentesis first as above. He was diuresed ~12 L: discharge weight 172 lbs. He was discharged on lasix 60mg  daily and spiro 25mg  daily, His last ECHO in the hospital showed improvement in pericardial effusion to moderate and was discharged with a limited outpatient ECHO on 04/02/15. This showed worsening of pericardial effusion to back to the same size as when he was recent admitted with mild RA but no RV collapse.   He saw Cecilie Kicks NP for post hospital follow up on 04/02/15. His weight was down to 154 lbs. A subsequent BMET showed that he had worseing renal function and he was told to decrease lasix from 60mg  --> 20mg  daily. Despite this, labwork from 04/16/15 showed his creat had increased from 1.6--> 2.25. Due to this lab work, Dr Acie Fredrickson asked the patient to stop his aldactone and come into be seen.  Today he presents to clinic. He still feels about the same. Mildlly SOB but not as bad as before his pericardiocentesis. No chest pain. Patient does not wish to proceed with any invasive testing and really wants to focus on a palliative care  approach. His daughter is here with him who is a Marine scientist and is on board with this decision.     Past Medical History  Diagnosis Date  . GOUT   . PERIPHERAL VASCULAR DISEASE   . DIVERTICULOSIS, COLON   . BENIGN PROSTATIC HYPERTROPHY   . DEPRESSION   . DIABETES MELLITUS, TYPE II   . GERD   . HYPERLIPIDEMIA   . HYPERTENSION   . CATARACTS   . OSTEOARTHRITIS   . ANXIETY   . ALLERGIC RHINITIS   . Pericardial effusion     Past Surgical History  Procedure Laterality Date  . Tonsillectomy  10/1942  . Cardiac catheterization N/A 03/19/2015    Procedure: Pericardiocentesis;  Surgeon: Jettie Booze, MD;  Location: [facility];  Service: Cardiovascular;  Laterality: N/A;     Current Outpatient Prescriptions  Medication Sig Dispense Refill  . acetaminophen (TYLENOL) 500 MG tablet Take 500 mg by mouth 4 (four) times daily - after meals and at bedtime. Take 4 times every day per patient    . allopurinol (ZYLOPRIM) 300 MG tablet Take 1 tablet (300 mg total) by mouth daily. 30 tablet 6  . ALPRAZolam (XANAX) 0.5 MG tablet Take 1 tablet (0.5 mg total) by mouth 3 (three) times daily as needed for anxiety. 20 tablet 0  . apixaban (ELIQUIS) 2.5 MG TABS tablet Take 1 tablet (2.5 mg total) by mouth 2 (two) times daily. 60 tablet 9  . bisacodyl (DULCOLAX) 10 MG suppository Place  10 mg rectally as needed for moderate constipation.    . fluticasone (FLONASE) 50 MCG/ACT nasal spray Place 2 sprays into both nostrils daily as needed for allergies or rhinitis.    . furosemide (LASIX) 20 MG tablet Take 20 mg by mouth.     . oxyCODONE (OXY IR/ROXICODONE) 5 MG immediate release tablet Take 1 tablet (5 mg total) by mouth every 6 (six) hours as needed for severe pain. 20 tablet 0  . polyethylene glycol (MIRALAX / GLYCOLAX) packet Take 17 g by mouth 2 (two) times daily.    . simvastatin (ZOCOR) 40 MG tablet Take 1 tablet (40 mg total) by mouth daily at 6 PM. 90 tablet 1  . tamsulosin (FLOMAX) 0.4 MG  CAPS capsule TAKE ONE CAPSULE BY MOUTH DAILY 90 capsule 3   No current facility-administered medications for this visit.    Allergies:   Penicillins    Social History:  The patient  reports that he quit smoking about 26 years ago. His smoking use included Cigarettes. He has a 120 pack-year smoking history. He does not have any smokeless tobacco history on file. He reports that he drinks alcohol. He reports that he does not use illicit drugs.   Family History:  The patient's family history includes Alcohol abuse in his other; Breast cancer in his other; Diabetes in his father and mother; Hypertension in his father and mother. There is no history of Heart attack.    ROS:  Please see the history of present illness.   Otherwise, review of systems are positive for none.   All other systems are reviewed and negative.    PHYSICAL EXAM: VS:  BP 140/50 mmHg  Pulse 62  Ht 6' (1.829 m)  Wt 162 lb 12.8 oz (73.846 kg)  BMI 22.07 kg/m2 , BMI Body mass index is 22.07 kg/(m^2). GEN: Well nourished, well developed, in no acute distresselderly  chronically ill appearing HEENT: normal Neck: no JVD, carotid bruits, or masses Cardiac: irreg ; no murmurs, rubs, or gallops,no edema  Respiratory:  clear to auscultation bilaterally, normal work of breathing GI: soft, nontender, nondistended, + BS MS: no deformity or atrophy Skin: warm and dry, no rash Neuro:  Strength and sensation are intact Psych: euthymic mood, full affect   EKG:  EKG is ordered today. The ekg ordered today demonstrates atrial flutter with variable AV block block and PVCs heart rate 62.   Recent Labs: 03/11/2015: ALT 9; Pro B Natriuretic peptide (BNP) 839.0* 03/13/2015: TSH 1.571 03/15/2015: B Natriuretic Peptide 1484.5* 03/16/2015: Magnesium 2.1 04/02/2015: Hemoglobin 11.8*; Platelets 373 04/16/2015: BUN 98*; Creatinine, Ser 2.34*; Potassium 5.4*; Sodium 136    Lipid Panel    Component Value Date/Time   CHOL 116 09/18/2013 1402    TRIG 41.0 09/18/2013 1402   TRIG 81 01/10/2008   HDL 61.50 09/18/2013 1402   CHOLHDL 2 09/18/2013 1402   VLDL 8.2 09/18/2013 1402   LDLCALC 46 09/18/2013 1402      Wt Readings from Last 3 Encounters:  04/17/15 162 lb 12.8 oz (73.846 kg)  04/15/15 161 lb (73.029 kg)  04/09/15 152 lb 8 oz (69.174 kg)      Other studies Reviewed: Additional studies/ records that were reviewed today include: 2D ECHO. Review of the above records demonstrates:    Limited 2D ECHO: 04/02/2015 LV EF: 60% -  65% Study Conclusions - Left ventricle: The cavity size was normal. Wall thickness was normal. Systolic function was normal. The estimated ejection fraction was in the range  of 60% to 65%. Wall motion was normal; there were no regional wall motion abnormalities. - Aortic valve: There was mild regurgitation. - Mitral valve: Calcified annulus. - Left atrium: The atrium was mildly dilated. - Pericardium, extracardiac: A large pericardial effusion was identified. Impressions: - Limited study to fu pericardial effusion. Normal LV function; large pericardial effusion; mild RA collapse but no RV collapse; compared to 03/22/15, effusion is larger.   ASSESSMENT AND PLAN:  Enlarging pericardial effusion - chronic moderate large pericardial effusion not respond to ibuprofen and colchicine treatment since 2015, likely cause of enlargement is acute on chronic diastolic HF - s/p pericardiocentesis during last admission with 1500 ccs of transudative fluid. Initially pericardial effusion improved, but follow up outpatient limited 2D ECHO showed that it has worsened back to large again. No signs of tamponade.  - his only real option is pericardial window, which he does not want  AKI: unclear etiology. Creat worsened from 1.6--> 2.25 despite scaling back on diuretics  H/o chronic afib on eliquis - CHA2DS2-Vasc score 5 (age, HTN, DM, HF) - This  patients CHA2DS2-VASc Score and unadjusted Ischemic Stroke Rate (% per year) is equal to 7.2 % stroke rate/year from a score of 5  Dispo: We had a long discussion with his primary cardiologist Dr. Acie Fredrickson. It was ultimately decided to proceed with palliative care/hospice approach. The patient is relatively asymptomatic currently and does not wish to proceed with any invasive procedures such as a pericardial window. He is fully aware of his situation and his options. He knows that he is at high risk of going into renal failure or heart failure and accepts this. It is not clear that even proceeding with a pericardial window would help his renal function. We are keeping him on all of his current doses of medications and will continue with observation. I will send his primary care doctor a note to ask if she will help [place] in getting him set up with palliative care services.  Current medicines are reviewed at length with the patient today.  The patient does not have concerns regarding medicines.  The following changes have been made:  no change  Labs/ tests ordered today include:   Orders Placed This Encounter  Procedures  . EKG 12-Lead     Disposition:   FU with Dr. Acie Fredrickson PRN Signed, Eileen Stanford, PA-C  04/17/2015 12:09 PM    [facility] [address], [place], [place]  [address] Phone: [phone]; Fax: [phone]

## 2015-04-17 NOTE — Patient Instructions (Signed)
Medication Instructions:  Your physician recommends that you continue on your current medications as directed. Please refer to the Current Medication list given to you today.   Labwork: None ordered  Testing/Procedures: None ordered  Follow-Up: Your physician wants you to follow-up in: 6 MONTHS WITH DR. NAHSER  You will receive a reminder letter in the mail two months in advance. If you don't receive a letter, please call our office to schedule the follow-up appointment.   Any Other Special Instructions Will Be Listed Below (If Applicable).     If you need a refill on your cardiac medications before your next appointment, please call your pharmacy.   

## 2015-04-17 NOTE — Telephone Encounter (Signed)
FMLA paperwork has been faxed. Pts family member has been notified and will come by the office to pick up the original copy.

## 2015-04-17 NOTE — Patient Outreach (Signed)
[facility]) Care Management  04/17/2015  Luis Glenn 16-Aug-1927 [id number]   Assessment: Transition of care - (week 2)- case closure Call placed to follow-up transition of care and spoke with patient. Patient informed care management coordinator that he had been to his follow-up appointments with his doctors. He was seen by [name] (primary care provider) on 3/6 and by cardiologist today 3/8. Patient shares that will not proceed with any aggressive treatments. He is fully aware of him being at high risk of having renal failure or heart failure anytime. He states he will continue with his medications and treatments until his condition gets worst.  According to patient, his daughter, who is a retired Marine scientist is currently staying with him to assist with his care.  Patient shares that he has [facility] nurse and physical therapist presently working with him. Home aide Mickel Crow) had been set-up to provide care and assistance 3 times a week for his house chores, housekeeping and transportation. Patient states that he is getting a lot of help from the different doctors that he is been seeing on scheduled visits. He shared that he does not really need any more services at this point. For right now, he states that he is receiving "a little too much, with so many people coming in and out" of his house. Patient expressed that he is getting "overwhelmed and irritated" with all these services from different people. He states that he also "wants time for himself" so he can do what he needs to do while he still can.  He is aware of his eligibility to [facility] program at no cost but adamantly refused setting appointment for [facility] home visit and decided not to continue with [facility] services at this time.  Patient verbalized, "if I need you, I will call you". Encouraged patient to call if he changes his mind for further or future needs that may arise. Patient mentioned having [facility] contact  information with him. Patient agreed to close case at this point and to notify primary care provider of such.   Plan: Will close case. Will notify primary care provider of case closure.  Baraa Tubbs A. Shanna Un, BSN, RN-BC [place] Management Coordinator Cell: [phone]

## 2015-04-17 NOTE — Telephone Encounter (Signed)
Completed.

## 2015-04-18 ENCOUNTER — Other Ambulatory Visit: Payer: Self-pay | Admitting: Internal Medicine

## 2015-04-18 DIAGNOSIS — N289 Disorder of kidney and ureter, unspecified: Secondary | ICD-10-CM

## 2015-04-18 DIAGNOSIS — L97211 Non-pressure chronic ulcer of right calf limited to breakdown of skin: Secondary | ICD-10-CM | POA: Diagnosis not present

## 2015-04-18 DIAGNOSIS — I5033 Acute on chronic diastolic (congestive) heart failure: Secondary | ICD-10-CM

## 2015-04-18 DIAGNOSIS — I3139 Other pericardial effusion (noninflammatory): Secondary | ICD-10-CM

## 2015-04-18 DIAGNOSIS — I739 Peripheral vascular disease, unspecified: Secondary | ICD-10-CM | POA: Diagnosis not present

## 2015-04-18 DIAGNOSIS — I509 Heart failure, unspecified: Secondary | ICD-10-CM | POA: Diagnosis not present

## 2015-04-18 DIAGNOSIS — L03115 Cellulitis of right lower limb: Secondary | ICD-10-CM | POA: Diagnosis not present

## 2015-04-18 DIAGNOSIS — L03116 Cellulitis of left lower limb: Secondary | ICD-10-CM | POA: Diagnosis not present

## 2015-04-18 DIAGNOSIS — I313 Pericardial effusion (noninflammatory): Secondary | ICD-10-CM

## 2015-04-18 DIAGNOSIS — I872 Venous insufficiency (chronic) (peripheral): Secondary | ICD-10-CM | POA: Diagnosis not present

## 2015-04-19 ENCOUNTER — Ambulatory Visit: Payer: Medicare Other | Admitting: Cardiovascular Disease

## 2015-04-19 DIAGNOSIS — I739 Peripheral vascular disease, unspecified: Secondary | ICD-10-CM | POA: Diagnosis not present

## 2015-04-19 DIAGNOSIS — I509 Heart failure, unspecified: Secondary | ICD-10-CM | POA: Diagnosis not present

## 2015-04-19 DIAGNOSIS — L03115 Cellulitis of right lower limb: Secondary | ICD-10-CM | POA: Diagnosis not present

## 2015-04-19 DIAGNOSIS — I872 Venous insufficiency (chronic) (peripheral): Secondary | ICD-10-CM | POA: Diagnosis not present

## 2015-04-19 DIAGNOSIS — L97211 Non-pressure chronic ulcer of right calf limited to breakdown of skin: Secondary | ICD-10-CM | POA: Diagnosis not present

## 2015-04-19 DIAGNOSIS — L03116 Cellulitis of left lower limb: Secondary | ICD-10-CM | POA: Diagnosis not present

## 2015-04-22 DIAGNOSIS — I872 Venous insufficiency (chronic) (peripheral): Secondary | ICD-10-CM | POA: Diagnosis not present

## 2015-04-22 DIAGNOSIS — L97211 Non-pressure chronic ulcer of right calf limited to breakdown of skin: Secondary | ICD-10-CM | POA: Diagnosis not present

## 2015-04-22 DIAGNOSIS — I739 Peripheral vascular disease, unspecified: Secondary | ICD-10-CM | POA: Diagnosis not present

## 2015-04-22 DIAGNOSIS — L03115 Cellulitis of right lower limb: Secondary | ICD-10-CM | POA: Diagnosis not present

## 2015-04-22 DIAGNOSIS — I509 Heart failure, unspecified: Secondary | ICD-10-CM | POA: Diagnosis not present

## 2015-04-22 DIAGNOSIS — L03116 Cellulitis of left lower limb: Secondary | ICD-10-CM | POA: Diagnosis not present

## 2015-04-24 DIAGNOSIS — L03116 Cellulitis of left lower limb: Secondary | ICD-10-CM | POA: Diagnosis not present

## 2015-04-24 DIAGNOSIS — L03115 Cellulitis of right lower limb: Secondary | ICD-10-CM | POA: Diagnosis not present

## 2015-04-24 DIAGNOSIS — I739 Peripheral vascular disease, unspecified: Secondary | ICD-10-CM | POA: Diagnosis not present

## 2015-04-24 DIAGNOSIS — L97211 Non-pressure chronic ulcer of right calf limited to breakdown of skin: Secondary | ICD-10-CM | POA: Diagnosis not present

## 2015-04-24 DIAGNOSIS — I872 Venous insufficiency (chronic) (peripheral): Secondary | ICD-10-CM | POA: Diagnosis not present

## 2015-04-24 DIAGNOSIS — I509 Heart failure, unspecified: Secondary | ICD-10-CM | POA: Diagnosis not present

## 2015-04-26 DIAGNOSIS — I739 Peripheral vascular disease, unspecified: Secondary | ICD-10-CM | POA: Diagnosis not present

## 2015-04-26 DIAGNOSIS — I509 Heart failure, unspecified: Secondary | ICD-10-CM | POA: Diagnosis not present

## 2015-04-26 DIAGNOSIS — L97211 Non-pressure chronic ulcer of right calf limited to breakdown of skin: Secondary | ICD-10-CM | POA: Diagnosis not present

## 2015-04-26 DIAGNOSIS — L03116 Cellulitis of left lower limb: Secondary | ICD-10-CM | POA: Diagnosis not present

## 2015-04-26 DIAGNOSIS — I872 Venous insufficiency (chronic) (peripheral): Secondary | ICD-10-CM | POA: Diagnosis not present

## 2015-04-26 DIAGNOSIS — L03115 Cellulitis of right lower limb: Secondary | ICD-10-CM | POA: Diagnosis not present

## 2015-04-30 ENCOUNTER — Telehealth: Payer: Self-pay | Admitting: Internal Medicine

## 2015-04-30 ENCOUNTER — Ambulatory Visit: Payer: Medicare Other | Admitting: Gastroenterology

## 2015-04-30 NOTE — Telephone Encounter (Signed)
FYI:  Was going to see patient but patient wanted to cancel.  Will see patient on Thursday.

## 2015-05-02 ENCOUNTER — Telehealth: Payer: Self-pay | Admitting: Cardiovascular Disease

## 2015-05-02 ENCOUNTER — Telehealth: Payer: Self-pay | Admitting: *Deleted

## 2015-05-02 ENCOUNTER — Encounter: Payer: Self-pay | Admitting: Internal Medicine

## 2015-05-02 DIAGNOSIS — L03115 Cellulitis of right lower limb: Secondary | ICD-10-CM | POA: Diagnosis not present

## 2015-05-02 DIAGNOSIS — L97211 Non-pressure chronic ulcer of right calf limited to breakdown of skin: Secondary | ICD-10-CM | POA: Diagnosis not present

## 2015-05-02 DIAGNOSIS — L03116 Cellulitis of left lower limb: Secondary | ICD-10-CM | POA: Diagnosis not present

## 2015-05-02 DIAGNOSIS — I739 Peripheral vascular disease, unspecified: Secondary | ICD-10-CM | POA: Diagnosis not present

## 2015-05-02 DIAGNOSIS — I872 Venous insufficiency (chronic) (peripheral): Secondary | ICD-10-CM | POA: Diagnosis not present

## 2015-05-02 DIAGNOSIS — I509 Heart failure, unspecified: Secondary | ICD-10-CM | POA: Diagnosis not present

## 2015-05-02 NOTE — Telephone Encounter (Signed)
Left message for [facility] with [facility] that Dr. Acie Fredrickson is not writing home health or palliative care orders for this patient and advised her to call [name], patient's PCP.

## 2015-05-02 NOTE — Telephone Encounter (Signed)
ok 

## 2015-05-02 NOTE — Telephone Encounter (Signed)
Left msg on triage requesting verbal order to continue seeing pt once a week for another 60 days due to his CHF...Johny Chess

## 2015-05-02 NOTE — Telephone Encounter (Signed)
Luis Glenn wanted to make [place] aware of the patient's weight gain--last week 04/25/15 weight was 157.2--today 05/02/15 is it 163.6.  Patient is on very little Lasix due to his kidney function.  Patient is also experiencing more shortness of breath and wants to know if we will order home oxygen.  Lastly, Luis Glenn would like to know if Dr. Acie Fredrickson would give a verbal order for her to continue weekly visits with this patient.  She has contacted the patient's PCP regarding this, but has not gotten a response.

## 2015-05-03 NOTE — Telephone Encounter (Signed)
Called [name] no answer LMOM w/MD response...Luis Glenn

## 2015-05-05 DIAGNOSIS — K589 Irritable bowel syndrome without diarrhea: Secondary | ICD-10-CM | POA: Diagnosis not present

## 2015-05-05 DIAGNOSIS — Z9181 History of falling: Secondary | ICD-10-CM | POA: Diagnosis not present

## 2015-05-05 DIAGNOSIS — E785 Hyperlipidemia, unspecified: Secondary | ICD-10-CM | POA: Diagnosis not present

## 2015-05-05 DIAGNOSIS — L03115 Cellulitis of right lower limb: Secondary | ICD-10-CM | POA: Diagnosis not present

## 2015-05-05 DIAGNOSIS — N183 Chronic kidney disease, stage 3 (moderate): Secondary | ICD-10-CM | POA: Diagnosis not present

## 2015-05-05 DIAGNOSIS — E1122 Type 2 diabetes mellitus with diabetic chronic kidney disease: Secondary | ICD-10-CM | POA: Diagnosis not present

## 2015-05-05 DIAGNOSIS — K219 Gastro-esophageal reflux disease without esophagitis: Secondary | ICD-10-CM | POA: Diagnosis not present

## 2015-05-05 DIAGNOSIS — K5790 Diverticulosis of intestine, part unspecified, without perforation or abscess without bleeding: Secondary | ICD-10-CM | POA: Diagnosis not present

## 2015-05-05 DIAGNOSIS — L03116 Cellulitis of left lower limb: Secondary | ICD-10-CM | POA: Diagnosis not present

## 2015-05-05 DIAGNOSIS — I5033 Acute on chronic diastolic (congestive) heart failure: Secondary | ICD-10-CM | POA: Diagnosis not present

## 2015-05-05 DIAGNOSIS — I4891 Unspecified atrial fibrillation: Secondary | ICD-10-CM | POA: Diagnosis not present

## 2015-05-05 DIAGNOSIS — I129 Hypertensive chronic kidney disease with stage 1 through stage 4 chronic kidney disease, or unspecified chronic kidney disease: Secondary | ICD-10-CM | POA: Diagnosis not present

## 2015-05-05 DIAGNOSIS — I739 Peripheral vascular disease, unspecified: Secondary | ICD-10-CM | POA: Diagnosis not present

## 2015-05-05 DIAGNOSIS — F329 Major depressive disorder, single episode, unspecified: Secondary | ICD-10-CM | POA: Diagnosis not present

## 2015-05-05 DIAGNOSIS — F419 Anxiety disorder, unspecified: Secondary | ICD-10-CM | POA: Diagnosis not present

## 2015-05-05 DIAGNOSIS — M150 Primary generalized (osteo)arthritis: Secondary | ICD-10-CM | POA: Diagnosis not present

## 2015-05-07 ENCOUNTER — Telehealth: Payer: Self-pay | Admitting: Internal Medicine

## 2015-05-07 NOTE — Telephone Encounter (Signed)
Yvette from Hospice called regarding the referral for Hospice. She states the family is wanting palliative care and not hospice

## 2015-05-07 NOTE — Telephone Encounter (Signed)
Spoke with Hospice to verify.

## 2015-05-10 DIAGNOSIS — I5033 Acute on chronic diastolic (congestive) heart failure: Secondary | ICD-10-CM | POA: Diagnosis not present

## 2015-05-10 DIAGNOSIS — L03116 Cellulitis of left lower limb: Secondary | ICD-10-CM | POA: Diagnosis not present

## 2015-05-10 DIAGNOSIS — L03115 Cellulitis of right lower limb: Secondary | ICD-10-CM | POA: Diagnosis not present

## 2015-05-10 DIAGNOSIS — K589 Irritable bowel syndrome without diarrhea: Secondary | ICD-10-CM | POA: Diagnosis not present

## 2015-05-10 DIAGNOSIS — E1122 Type 2 diabetes mellitus with diabetic chronic kidney disease: Secondary | ICD-10-CM | POA: Diagnosis not present

## 2015-05-10 DIAGNOSIS — I739 Peripheral vascular disease, unspecified: Secondary | ICD-10-CM | POA: Diagnosis not present

## 2015-05-13 ENCOUNTER — Telehealth: Payer: Self-pay | Admitting: Internal Medicine

## 2015-05-13 DIAGNOSIS — R0602 Shortness of breath: Secondary | ICD-10-CM | POA: Diagnosis not present

## 2015-05-13 DIAGNOSIS — G4733 Obstructive sleep apnea (adult) (pediatric): Secondary | ICD-10-CM

## 2015-05-13 NOTE — Telephone Encounter (Signed)
Please advice  

## 2015-05-13 NOTE — Telephone Encounter (Signed)
I tried to call Luis Glenn, but was only able to get her voicemail - I did not leave a message.  Ok to [employer], xanax is prescribed as three times daily as needed.  Ok to start oxygen at night.  Ok to reinstate PT.  Let me know what orders are needed

## 2015-05-13 NOTE — Telephone Encounter (Signed)
Shortness of breath requesting Roxanol.  Patient has xanax but only takes out night, believes would be beneficial to take through the day as needed.  States patient does not have oxygen in the home.  Believes patient would benefit at night having this.  Also requesting PT to be ordered through [facility] - to reinstate.

## 2015-05-14 ENCOUNTER — Telehealth: Payer: Self-pay | Admitting: Emergency Medicine

## 2015-05-14 MED ORDER — MORPHINE SULFATE 20 MG/5ML PO SOLN: 2.5000 mg | ORAL | Status: AC | PRN

## 2015-05-14 MED ORDER — OXYCODONE HCL 5 MG PO TABS: 5.0000 mg | ORAL_TABLET | Freq: Four times a day (QID) | ORAL | Status: AC | PRN

## 2015-05-14 NOTE — Telephone Encounter (Signed)
printed

## 2015-05-14 NOTE — Telephone Encounter (Signed)
Spoke with [facility] nurse, she stated that pt would need a hard copy of RX for Roxanol since they are no longer at the pts home.

## 2015-05-14 NOTE — Telephone Encounter (Signed)
RX placed up front for pickup

## 2015-05-14 NOTE — Telephone Encounter (Signed)
LVM informing Rodena Piety

## 2015-05-14 NOTE — Telephone Encounter (Signed)
rx printed - they can let us know if dose needs to be adjusted

## 2015-05-14 NOTE — Telephone Encounter (Signed)
Spoke with pt to inform, Pt is also requesting Oxycodone, please advise. Last fill 03/22/15

## 2015-05-16 ENCOUNTER — Telehealth: Payer: Self-pay | Admitting: Emergency Medicine

## 2015-05-16 DIAGNOSIS — L03115 Cellulitis of right lower limb: Secondary | ICD-10-CM | POA: Diagnosis not present

## 2015-05-16 DIAGNOSIS — K589 Irritable bowel syndrome without diarrhea: Secondary | ICD-10-CM | POA: Diagnosis not present

## 2015-05-16 DIAGNOSIS — I5033 Acute on chronic diastolic (congestive) heart failure: Secondary | ICD-10-CM

## 2015-05-16 DIAGNOSIS — I739 Peripheral vascular disease, unspecified: Secondary | ICD-10-CM | POA: Diagnosis not present

## 2015-05-16 DIAGNOSIS — E1122 Type 2 diabetes mellitus with diabetic chronic kidney disease: Secondary | ICD-10-CM | POA: Diagnosis not present

## 2015-05-16 DIAGNOSIS — L03116 Cellulitis of left lower limb: Secondary | ICD-10-CM | POA: Diagnosis not present

## 2015-05-16 IMAGING — CR DG CHEST 2V
2 series · 2 of 2 positions shown · non-contrast
Comparison: None.

CLINICAL DATA: Shortness of breath and cough.  Former smoker

EXAM:
CHEST  2 VIEW

[view not recorded (1 of 2)]
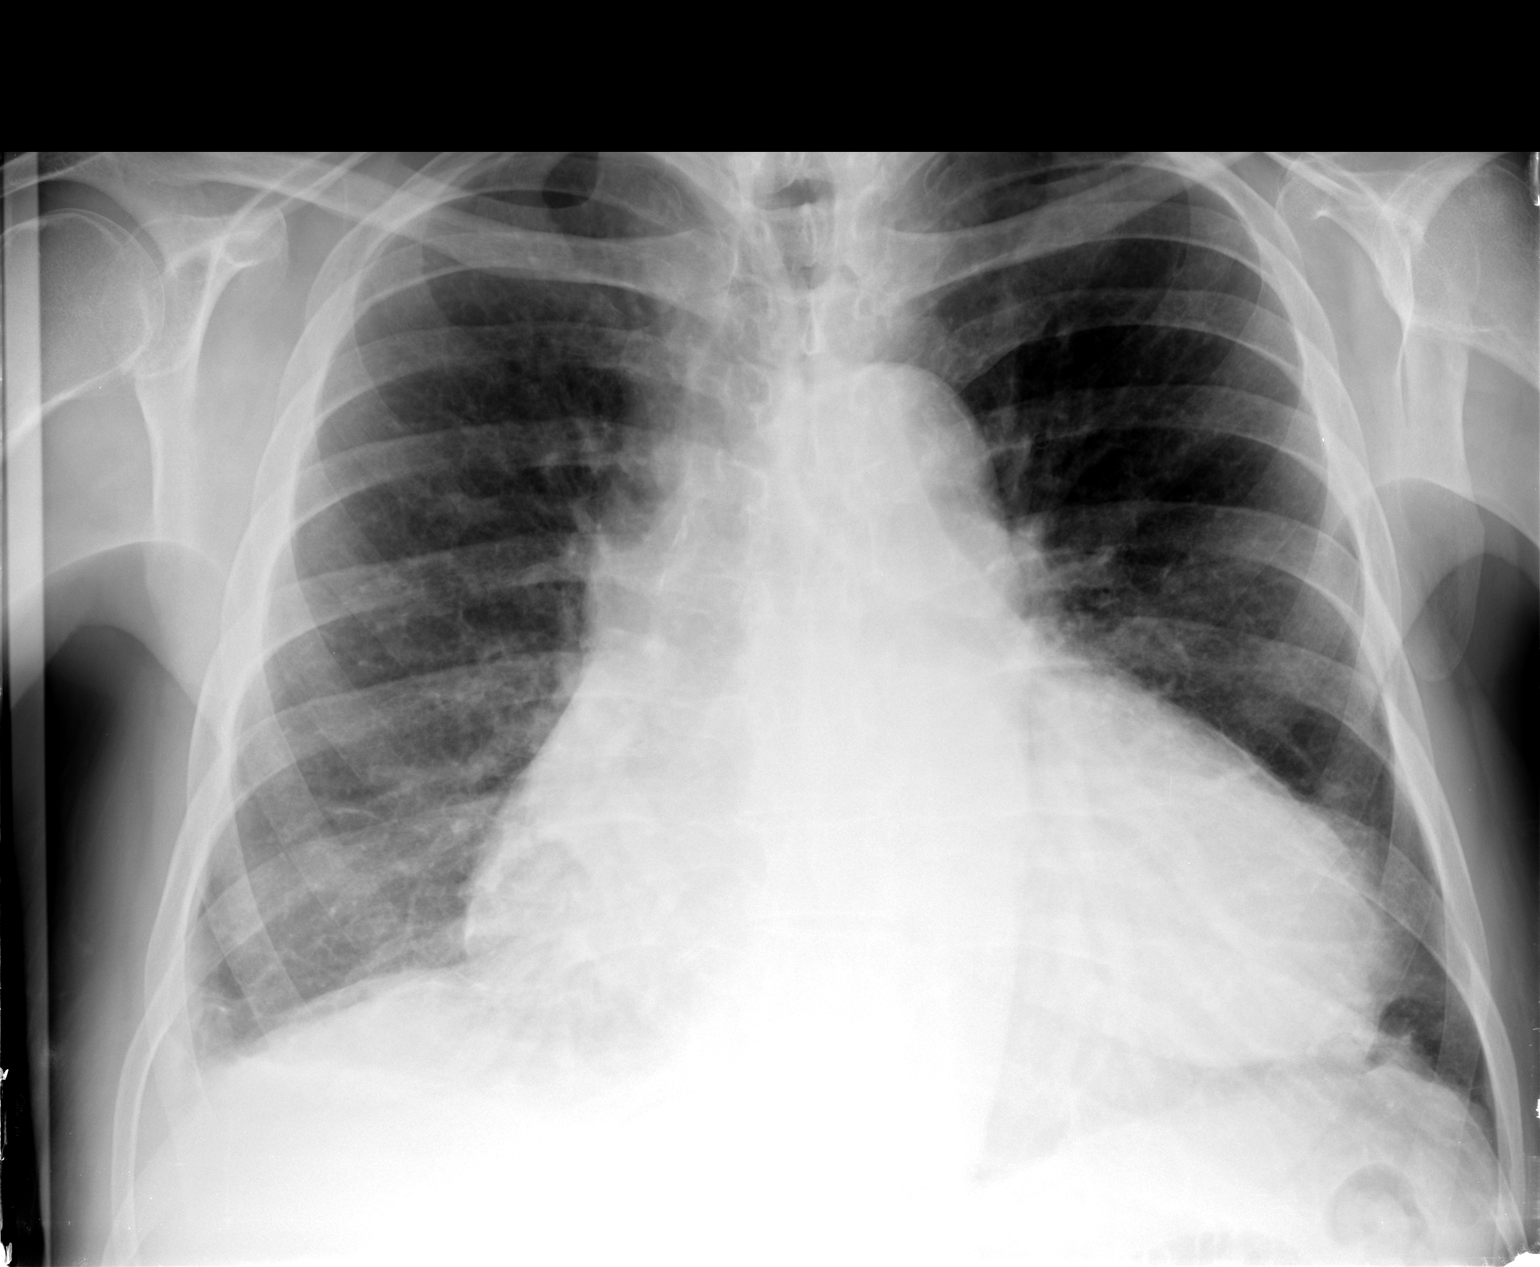

[view not recorded (2 of 2)]
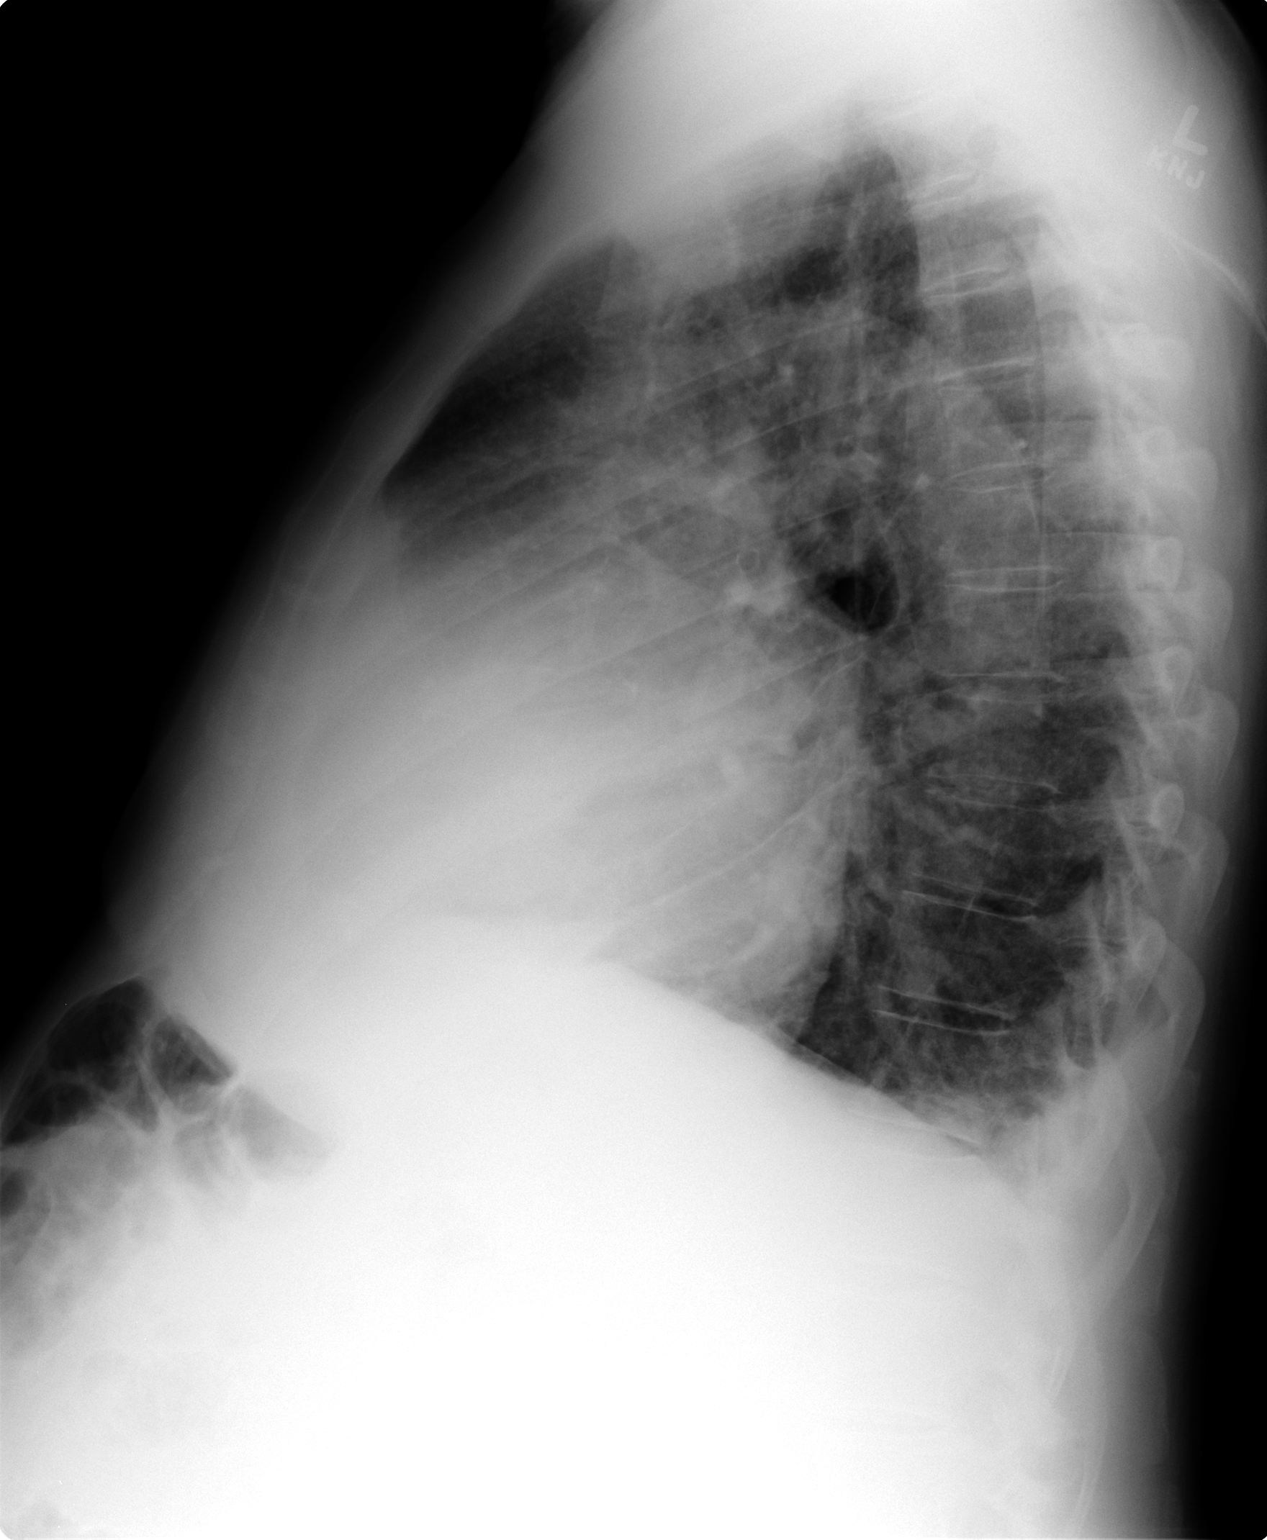

[2 of 2 positions shown; findings below may reference images not displayed]

FINDINGS: There is moderate to marked cardiac enlargement. There is a small
right pleural effusion. No interstitial edema. No airspace
consolidation. The visualized osseous structures appear intact.
IMPRESSION: 1. Cardiac enlargement.
2. Small right effusion.

## 2015-05-16 NOTE — Telephone Encounter (Signed)
Is he still taking the Lasix 20 mg daily?  Since we are still actively treating him he needs kidney function done so we can adjust his diuretics.  He was on Hytrin 10 mg daily previously and we can restart that.

## 2015-05-16 NOTE — Addendum Note (Signed)
Addended by: Terence Lux B on: 05/16/2015 01:57 PM   Modules accepted: Orders

## 2015-05-16 NOTE — Telephone Encounter (Addendum)
Referral sent to reinstate PT, Orders sent for Home OT.

## 2015-05-16 NOTE — Telephone Encounter (Signed)
[facility] nurse called to inform that pts BP was 181/66. Cardiology took pt off BP meds and also told Luis Glenn that we were managing pts care. Pts weight has been slowing increasing. Today 05/16/2015 weight was 171.8lbs, Friday 05/10/2015 pts weight was 168lbs. Please advise, pt is not currently having any symptoms.

## 2015-05-16 NOTE — Telephone Encounter (Signed)
Is needing order for oxygen at night to be faxed to [place]

## 2015-05-17 ENCOUNTER — Other Ambulatory Visit: Payer: Self-pay | Admitting: Internal Medicine

## 2015-05-17 ENCOUNTER — Telehealth: Payer: Self-pay | Admitting: Emergency Medicine

## 2015-05-17 DIAGNOSIS — I5033 Acute on chronic diastolic (congestive) heart failure: Secondary | ICD-10-CM | POA: Diagnosis not present

## 2015-05-17 DIAGNOSIS — L03115 Cellulitis of right lower limb: Secondary | ICD-10-CM | POA: Diagnosis not present

## 2015-05-17 DIAGNOSIS — L03116 Cellulitis of left lower limb: Secondary | ICD-10-CM | POA: Diagnosis not present

## 2015-05-17 DIAGNOSIS — I739 Peripheral vascular disease, unspecified: Secondary | ICD-10-CM | POA: Diagnosis not present

## 2015-05-17 DIAGNOSIS — E1122 Type 2 diabetes mellitus with diabetic chronic kidney disease: Secondary | ICD-10-CM | POA: Diagnosis not present

## 2015-05-17 DIAGNOSIS — K589 Irritable bowel syndrome without diarrhea: Secondary | ICD-10-CM | POA: Diagnosis not present

## 2015-05-17 MED ORDER — CLINDAMYCIN HCL 300 MG PO CAPS: 300.0000 mg | ORAL_CAPSULE | Freq: Three times a day (TID) | ORAL | Status: AC

## 2015-05-17 NOTE — Telephone Encounter (Signed)
NO from Pallative care states that pt has an infection in his gums, recently had rotting tooth and it broke off and caused an infection. Is there an antibiotic that can be sent in? Please advise.

## 2015-05-17 NOTE — Telephone Encounter (Signed)
Spoke with pt to inform.  

## 2015-05-17 NOTE — Telephone Encounter (Signed)
NP from Palliative Care called to advise that she spoke with pt and family and they are on board to start Hospice. Referral entered, will call to follow-up

## 2015-05-17 NOTE — Telephone Encounter (Signed)
Spoke with [facility] to find out if they can supply pts night o2. Will receive call back from nurse.

## 2015-05-17 NOTE — Telephone Encounter (Signed)
ok 

## 2015-05-17 NOTE — Telephone Encounter (Signed)
Given penicillin allergy will send in clindamycin - sent to [facility]

## 2015-05-19 DIAGNOSIS — I313 Pericardial effusion (noninflammatory): Secondary | ICD-10-CM | POA: Diagnosis not present

## 2015-05-19 DIAGNOSIS — I739 Peripheral vascular disease, unspecified: Secondary | ICD-10-CM | POA: Diagnosis not present

## 2015-05-19 DIAGNOSIS — N189 Chronic kidney disease, unspecified: Secondary | ICD-10-CM | POA: Diagnosis not present

## 2015-05-19 DIAGNOSIS — I10 Essential (primary) hypertension: Secondary | ICD-10-CM | POA: Diagnosis not present

## 2015-05-19 DIAGNOSIS — M109 Gout, unspecified: Secondary | ICD-10-CM | POA: Diagnosis not present

## 2015-05-19 DIAGNOSIS — I4891 Unspecified atrial fibrillation: Secondary | ICD-10-CM | POA: Diagnosis not present

## 2015-05-19 DIAGNOSIS — E1159 Type 2 diabetes mellitus with other circulatory complications: Secondary | ICD-10-CM | POA: Diagnosis not present

## 2015-05-19 DIAGNOSIS — N401 Enlarged prostate with lower urinary tract symptoms: Secondary | ICD-10-CM | POA: Diagnosis not present

## 2015-05-19 DIAGNOSIS — I25119 Atherosclerotic heart disease of native coronary artery with unspecified angina pectoris: Secondary | ICD-10-CM | POA: Diagnosis not present

## 2015-05-19 DIAGNOSIS — I5030 Unspecified diastolic (congestive) heart failure: Secondary | ICD-10-CM | POA: Diagnosis not present

## 2015-05-19 DIAGNOSIS — E785 Hyperlipidemia, unspecified: Secondary | ICD-10-CM | POA: Diagnosis not present

## 2015-05-19 DIAGNOSIS — J301 Allergic rhinitis due to pollen: Secondary | ICD-10-CM | POA: Diagnosis not present

## 2015-05-20 DIAGNOSIS — I313 Pericardial effusion (noninflammatory): Secondary | ICD-10-CM | POA: Diagnosis not present

## 2015-05-20 DIAGNOSIS — I5030 Unspecified diastolic (congestive) heart failure: Secondary | ICD-10-CM | POA: Diagnosis not present

## 2015-05-20 DIAGNOSIS — I739 Peripheral vascular disease, unspecified: Secondary | ICD-10-CM | POA: Diagnosis not present

## 2015-05-20 DIAGNOSIS — E1159 Type 2 diabetes mellitus with other circulatory complications: Secondary | ICD-10-CM | POA: Diagnosis not present

## 2015-05-20 DIAGNOSIS — N189 Chronic kidney disease, unspecified: Secondary | ICD-10-CM | POA: Diagnosis not present

## 2015-05-20 DIAGNOSIS — I25119 Atherosclerotic heart disease of native coronary artery with unspecified angina pectoris: Secondary | ICD-10-CM | POA: Diagnosis not present

## 2015-05-22 DIAGNOSIS — I313 Pericardial effusion (noninflammatory): Secondary | ICD-10-CM | POA: Diagnosis not present

## 2015-05-22 DIAGNOSIS — E1159 Type 2 diabetes mellitus with other circulatory complications: Secondary | ICD-10-CM | POA: Diagnosis not present

## 2015-05-22 DIAGNOSIS — I5030 Unspecified diastolic (congestive) heart failure: Secondary | ICD-10-CM | POA: Diagnosis not present

## 2015-05-22 DIAGNOSIS — N189 Chronic kidney disease, unspecified: Secondary | ICD-10-CM | POA: Diagnosis not present

## 2015-05-22 DIAGNOSIS — I25119 Atherosclerotic heart disease of native coronary artery with unspecified angina pectoris: Secondary | ICD-10-CM | POA: Diagnosis not present

## 2015-05-22 DIAGNOSIS — I739 Peripheral vascular disease, unspecified: Secondary | ICD-10-CM | POA: Diagnosis not present

## 2015-05-29 DIAGNOSIS — N189 Chronic kidney disease, unspecified: Secondary | ICD-10-CM | POA: Diagnosis not present

## 2015-05-29 DIAGNOSIS — I5030 Unspecified diastolic (congestive) heart failure: Secondary | ICD-10-CM | POA: Diagnosis not present

## 2015-05-29 DIAGNOSIS — I739 Peripheral vascular disease, unspecified: Secondary | ICD-10-CM | POA: Diagnosis not present

## 2015-05-29 DIAGNOSIS — I313 Pericardial effusion (noninflammatory): Secondary | ICD-10-CM | POA: Diagnosis not present

## 2015-05-29 DIAGNOSIS — I25119 Atherosclerotic heart disease of native coronary artery with unspecified angina pectoris: Secondary | ICD-10-CM | POA: Diagnosis not present

## 2015-05-29 DIAGNOSIS — E1159 Type 2 diabetes mellitus with other circulatory complications: Secondary | ICD-10-CM | POA: Diagnosis not present

## 2015-05-30 DIAGNOSIS — E1159 Type 2 diabetes mellitus with other circulatory complications: Secondary | ICD-10-CM | POA: Diagnosis not present

## 2015-05-30 DIAGNOSIS — N189 Chronic kidney disease, unspecified: Secondary | ICD-10-CM | POA: Diagnosis not present

## 2015-05-30 DIAGNOSIS — I313 Pericardial effusion (noninflammatory): Secondary | ICD-10-CM | POA: Diagnosis not present

## 2015-05-30 DIAGNOSIS — I739 Peripheral vascular disease, unspecified: Secondary | ICD-10-CM | POA: Diagnosis not present

## 2015-05-30 DIAGNOSIS — I5030 Unspecified diastolic (congestive) heart failure: Secondary | ICD-10-CM | POA: Diagnosis not present

## 2015-05-30 DIAGNOSIS — I25119 Atherosclerotic heart disease of native coronary artery with unspecified angina pectoris: Secondary | ICD-10-CM | POA: Diagnosis not present

## 2015-06-04 ENCOUNTER — Telehealth: Payer: Self-pay

## 2015-06-04 DIAGNOSIS — L03116 Cellulitis of left lower limb: Secondary | ICD-10-CM | POA: Diagnosis not present

## 2015-06-04 DIAGNOSIS — I5033 Acute on chronic diastolic (congestive) heart failure: Secondary | ICD-10-CM | POA: Diagnosis not present

## 2015-06-04 DIAGNOSIS — L03115 Cellulitis of right lower limb: Secondary | ICD-10-CM | POA: Diagnosis not present

## 2015-06-04 NOTE — Telephone Encounter (Signed)
Home Health Cert/Plan of Care received (05/05/2015 - 07/03/2015) and placed on MD's desk for signature

## 2015-06-05 ENCOUNTER — Telehealth: Payer: Self-pay | Admitting: *Deleted

## 2015-06-05 DIAGNOSIS — I739 Peripheral vascular disease, unspecified: Secondary | ICD-10-CM | POA: Diagnosis not present

## 2015-06-05 DIAGNOSIS — E1159 Type 2 diabetes mellitus with other circulatory complications: Secondary | ICD-10-CM | POA: Diagnosis not present

## 2015-06-05 DIAGNOSIS — I25119 Atherosclerotic heart disease of native coronary artery with unspecified angina pectoris: Secondary | ICD-10-CM | POA: Diagnosis not present

## 2015-06-05 DIAGNOSIS — I5030 Unspecified diastolic (congestive) heart failure: Secondary | ICD-10-CM | POA: Diagnosis not present

## 2015-06-05 DIAGNOSIS — I313 Pericardial effusion (noninflammatory): Secondary | ICD-10-CM | POA: Diagnosis not present

## 2015-06-05 DIAGNOSIS — N189 Chronic kidney disease, unspecified: Secondary | ICD-10-CM | POA: Diagnosis not present

## 2015-06-05 NOTE — Telephone Encounter (Signed)
Paperwork signed, faxed, copy sent to scan 

## 2015-06-05 NOTE — Telephone Encounter (Signed)
Pharmacist advised Eliquis Rx'd by cardiology.  Pharmacist states that Rx was called in 06/04/2015 by "Patti with Dr Quay Burow' office".  I advised that there was no documentation of call, medication last refilled #60 x 9 on 12/28/2014 by Dr Cathie Olden.

## 2015-06-05 NOTE — Telephone Encounter (Signed)
Received call from [place] she states pt is needing PA on Elaquis. Must call [phone], Member ID [id number]. Inform her will forward msg to start PA will contact them back once approve...Johny Chess

## 2015-06-10 ENCOUNTER — Encounter: Payer: Self-pay | Admitting: Internal Medicine

## 2015-06-10 DIAGNOSIS — I4891 Unspecified atrial fibrillation: Secondary | ICD-10-CM | POA: Diagnosis not present

## 2015-06-10 DIAGNOSIS — I313 Pericardial effusion (noninflammatory): Secondary | ICD-10-CM | POA: Diagnosis not present

## 2015-06-10 DIAGNOSIS — N401 Enlarged prostate with lower urinary tract symptoms: Secondary | ICD-10-CM | POA: Diagnosis not present

## 2015-06-10 DIAGNOSIS — N189 Chronic kidney disease, unspecified: Secondary | ICD-10-CM | POA: Diagnosis not present

## 2015-06-10 DIAGNOSIS — I5030 Unspecified diastolic (congestive) heart failure: Secondary | ICD-10-CM | POA: Diagnosis not present

## 2015-06-10 DIAGNOSIS — I10 Essential (primary) hypertension: Secondary | ICD-10-CM | POA: Diagnosis not present

## 2015-06-10 DIAGNOSIS — J301 Allergic rhinitis due to pollen: Secondary | ICD-10-CM | POA: Diagnosis not present

## 2015-06-10 DIAGNOSIS — M109 Gout, unspecified: Secondary | ICD-10-CM | POA: Diagnosis not present

## 2015-06-10 DIAGNOSIS — I739 Peripheral vascular disease, unspecified: Secondary | ICD-10-CM | POA: Diagnosis not present

## 2015-06-10 DIAGNOSIS — E1159 Type 2 diabetes mellitus with other circulatory complications: Secondary | ICD-10-CM | POA: Diagnosis not present

## 2015-06-10 DIAGNOSIS — I25119 Atherosclerotic heart disease of native coronary artery with unspecified angina pectoris: Secondary | ICD-10-CM | POA: Diagnosis not present

## 2015-06-10 DIAGNOSIS — E785 Hyperlipidemia, unspecified: Secondary | ICD-10-CM | POA: Diagnosis not present

## 2015-06-12 ENCOUNTER — Encounter: Payer: Self-pay | Admitting: Internal Medicine

## 2015-06-12 DIAGNOSIS — I739 Peripheral vascular disease, unspecified: Secondary | ICD-10-CM | POA: Diagnosis not present

## 2015-06-12 DIAGNOSIS — N189 Chronic kidney disease, unspecified: Secondary | ICD-10-CM | POA: Diagnosis not present

## 2015-06-12 DIAGNOSIS — I313 Pericardial effusion (noninflammatory): Secondary | ICD-10-CM | POA: Diagnosis not present

## 2015-06-12 DIAGNOSIS — I5030 Unspecified diastolic (congestive) heart failure: Secondary | ICD-10-CM | POA: Diagnosis not present

## 2015-06-12 DIAGNOSIS — I25119 Atherosclerotic heart disease of native coronary artery with unspecified angina pectoris: Secondary | ICD-10-CM | POA: Diagnosis not present

## 2015-06-12 DIAGNOSIS — E1159 Type 2 diabetes mellitus with other circulatory complications: Secondary | ICD-10-CM | POA: Diagnosis not present

## 2015-06-17 ENCOUNTER — Telehealth: Payer: Self-pay | Admitting: *Deleted

## 2015-06-17 ENCOUNTER — Ambulatory Visit: Payer: Medicare Other | Admitting: Internal Medicine

## 2015-06-17 NOTE — Telephone Encounter (Signed)
I spoke to patient and daughter on 45-month follow-up phone call for [email or web address]  Study.Patient is in hospice care for renal failure and heart failure. I thanked patient for participating in the study.

## 2015-06-18 ENCOUNTER — Telehealth: Payer: Self-pay | Admitting: Internal Medicine

## 2015-06-18 NOTE — Telephone Encounter (Signed)
Called Breanna from [employer] [phone] ext [phone], left vm, trying to get the FMLA paper work fax to our office.

## 2015-06-19 DIAGNOSIS — I25119 Atherosclerotic heart disease of native coronary artery with unspecified angina pectoris: Secondary | ICD-10-CM | POA: Diagnosis not present

## 2015-06-19 DIAGNOSIS — I739 Peripheral vascular disease, unspecified: Secondary | ICD-10-CM | POA: Diagnosis not present

## 2015-06-19 DIAGNOSIS — N189 Chronic kidney disease, unspecified: Secondary | ICD-10-CM | POA: Diagnosis not present

## 2015-06-19 DIAGNOSIS — I313 Pericardial effusion (noninflammatory): Secondary | ICD-10-CM | POA: Diagnosis not present

## 2015-06-19 DIAGNOSIS — I5030 Unspecified diastolic (congestive) heart failure: Secondary | ICD-10-CM | POA: Diagnosis not present

## 2015-06-19 DIAGNOSIS — E1159 Type 2 diabetes mellitus with other circulatory complications: Secondary | ICD-10-CM | POA: Diagnosis not present

## 2015-06-19 NOTE — Telephone Encounter (Signed)
Left another vm for her to call back.

## 2015-06-19 NOTE — Telephone Encounter (Signed)
Got the paper work, give to [place] to fill out. Daughter aware that we have the form.

## 2015-06-19 NOTE — Telephone Encounter (Signed)
Detailed messaged left for [place] including office fax number and my phone number if there are any further questions

## 2015-06-20 NOTE — Telephone Encounter (Signed)
FMLA completed and placed on MD's desk for signature

## 2015-06-20 NOTE — Telephone Encounter (Signed)
Paperwork signed, faxed, copy sent to scan.  Pt's daughter advised of same and request original be mailed to address on file.

## 2015-06-26 DIAGNOSIS — I313 Pericardial effusion (noninflammatory): Secondary | ICD-10-CM | POA: Diagnosis not present

## 2015-06-26 DIAGNOSIS — I739 Peripheral vascular disease, unspecified: Secondary | ICD-10-CM | POA: Diagnosis not present

## 2015-06-26 DIAGNOSIS — I25119 Atherosclerotic heart disease of native coronary artery with unspecified angina pectoris: Secondary | ICD-10-CM | POA: Diagnosis not present

## 2015-06-26 DIAGNOSIS — I5030 Unspecified diastolic (congestive) heart failure: Secondary | ICD-10-CM | POA: Diagnosis not present

## 2015-06-26 DIAGNOSIS — N189 Chronic kidney disease, unspecified: Secondary | ICD-10-CM | POA: Diagnosis not present

## 2015-06-26 DIAGNOSIS — E1159 Type 2 diabetes mellitus with other circulatory complications: Secondary | ICD-10-CM | POA: Diagnosis not present

## 2015-06-27 DIAGNOSIS — I739 Peripheral vascular disease, unspecified: Secondary | ICD-10-CM | POA: Diagnosis not present

## 2015-06-27 DIAGNOSIS — E1159 Type 2 diabetes mellitus with other circulatory complications: Secondary | ICD-10-CM | POA: Diagnosis not present

## 2015-06-27 DIAGNOSIS — I5030 Unspecified diastolic (congestive) heart failure: Secondary | ICD-10-CM | POA: Diagnosis not present

## 2015-06-27 DIAGNOSIS — I313 Pericardial effusion (noninflammatory): Secondary | ICD-10-CM | POA: Diagnosis not present

## 2015-06-27 DIAGNOSIS — N189 Chronic kidney disease, unspecified: Secondary | ICD-10-CM | POA: Diagnosis not present

## 2015-06-27 DIAGNOSIS — I25119 Atherosclerotic heart disease of native coronary artery with unspecified angina pectoris: Secondary | ICD-10-CM | POA: Diagnosis not present

## 2015-07-03 DIAGNOSIS — N189 Chronic kidney disease, unspecified: Secondary | ICD-10-CM | POA: Diagnosis not present

## 2015-07-03 DIAGNOSIS — I5030 Unspecified diastolic (congestive) heart failure: Secondary | ICD-10-CM | POA: Diagnosis not present

## 2015-07-03 DIAGNOSIS — I25119 Atherosclerotic heart disease of native coronary artery with unspecified angina pectoris: Secondary | ICD-10-CM | POA: Diagnosis not present

## 2015-07-03 DIAGNOSIS — I313 Pericardial effusion (noninflammatory): Secondary | ICD-10-CM | POA: Diagnosis not present

## 2015-07-03 DIAGNOSIS — I739 Peripheral vascular disease, unspecified: Secondary | ICD-10-CM | POA: Diagnosis not present

## 2015-07-03 DIAGNOSIS — E1159 Type 2 diabetes mellitus with other circulatory complications: Secondary | ICD-10-CM | POA: Diagnosis not present

## 2015-07-10 DIAGNOSIS — I25119 Atherosclerotic heart disease of native coronary artery with unspecified angina pectoris: Secondary | ICD-10-CM | POA: Diagnosis not present

## 2015-07-10 DIAGNOSIS — I5030 Unspecified diastolic (congestive) heart failure: Secondary | ICD-10-CM | POA: Diagnosis not present

## 2015-07-10 DIAGNOSIS — I313 Pericardial effusion (noninflammatory): Secondary | ICD-10-CM | POA: Diagnosis not present

## 2015-07-10 DIAGNOSIS — I739 Peripheral vascular disease, unspecified: Secondary | ICD-10-CM | POA: Diagnosis not present

## 2015-07-10 DIAGNOSIS — E1159 Type 2 diabetes mellitus with other circulatory complications: Secondary | ICD-10-CM | POA: Diagnosis not present

## 2015-07-10 DIAGNOSIS — N189 Chronic kidney disease, unspecified: Secondary | ICD-10-CM | POA: Diagnosis not present

## 2015-07-11 DIAGNOSIS — J301 Allergic rhinitis due to pollen: Secondary | ICD-10-CM | POA: Diagnosis not present

## 2015-07-11 DIAGNOSIS — N189 Chronic kidney disease, unspecified: Secondary | ICD-10-CM | POA: Diagnosis not present

## 2015-07-11 DIAGNOSIS — I4891 Unspecified atrial fibrillation: Secondary | ICD-10-CM | POA: Diagnosis not present

## 2015-07-11 DIAGNOSIS — E785 Hyperlipidemia, unspecified: Secondary | ICD-10-CM | POA: Diagnosis not present

## 2015-07-11 DIAGNOSIS — I313 Pericardial effusion (noninflammatory): Secondary | ICD-10-CM | POA: Diagnosis not present

## 2015-07-11 DIAGNOSIS — I10 Essential (primary) hypertension: Secondary | ICD-10-CM | POA: Diagnosis not present

## 2015-07-11 DIAGNOSIS — M109 Gout, unspecified: Secondary | ICD-10-CM | POA: Diagnosis not present

## 2015-07-11 DIAGNOSIS — E1159 Type 2 diabetes mellitus with other circulatory complications: Secondary | ICD-10-CM | POA: Diagnosis not present

## 2015-07-11 DIAGNOSIS — I25119 Atherosclerotic heart disease of native coronary artery with unspecified angina pectoris: Secondary | ICD-10-CM | POA: Diagnosis not present

## 2015-07-11 DIAGNOSIS — I739 Peripheral vascular disease, unspecified: Secondary | ICD-10-CM | POA: Diagnosis not present

## 2015-07-11 DIAGNOSIS — I5030 Unspecified diastolic (congestive) heart failure: Secondary | ICD-10-CM | POA: Diagnosis not present

## 2015-07-11 DIAGNOSIS — N401 Enlarged prostate with lower urinary tract symptoms: Secondary | ICD-10-CM | POA: Diagnosis not present

## 2015-07-17 DIAGNOSIS — I5030 Unspecified diastolic (congestive) heart failure: Secondary | ICD-10-CM | POA: Diagnosis not present

## 2015-07-17 DIAGNOSIS — I25119 Atherosclerotic heart disease of native coronary artery with unspecified angina pectoris: Secondary | ICD-10-CM | POA: Diagnosis not present

## 2015-07-17 DIAGNOSIS — I313 Pericardial effusion (noninflammatory): Secondary | ICD-10-CM | POA: Diagnosis not present

## 2015-07-17 DIAGNOSIS — E1159 Type 2 diabetes mellitus with other circulatory complications: Secondary | ICD-10-CM | POA: Diagnosis not present

## 2015-07-17 DIAGNOSIS — N189 Chronic kidney disease, unspecified: Secondary | ICD-10-CM | POA: Diagnosis not present

## 2015-07-17 DIAGNOSIS — I739 Peripheral vascular disease, unspecified: Secondary | ICD-10-CM | POA: Diagnosis not present

## 2015-07-24 DIAGNOSIS — E1159 Type 2 diabetes mellitus with other circulatory complications: Secondary | ICD-10-CM | POA: Diagnosis not present

## 2015-07-24 DIAGNOSIS — I739 Peripheral vascular disease, unspecified: Secondary | ICD-10-CM | POA: Diagnosis not present

## 2015-07-24 DIAGNOSIS — I25119 Atherosclerotic heart disease of native coronary artery with unspecified angina pectoris: Secondary | ICD-10-CM | POA: Diagnosis not present

## 2015-07-24 DIAGNOSIS — N189 Chronic kidney disease, unspecified: Secondary | ICD-10-CM | POA: Diagnosis not present

## 2015-07-24 DIAGNOSIS — I5030 Unspecified diastolic (congestive) heart failure: Secondary | ICD-10-CM | POA: Diagnosis not present

## 2015-07-24 DIAGNOSIS — I313 Pericardial effusion (noninflammatory): Secondary | ICD-10-CM | POA: Diagnosis not present

## 2015-07-30 ENCOUNTER — Telehealth: Payer: Self-pay

## 2015-07-30 DIAGNOSIS — I313 Pericardial effusion (noninflammatory): Secondary | ICD-10-CM | POA: Diagnosis not present

## 2015-07-30 DIAGNOSIS — E1159 Type 2 diabetes mellitus with other circulatory complications: Secondary | ICD-10-CM | POA: Diagnosis not present

## 2015-07-30 DIAGNOSIS — N189 Chronic kidney disease, unspecified: Secondary | ICD-10-CM | POA: Diagnosis not present

## 2015-07-30 DIAGNOSIS — I25119 Atherosclerotic heart disease of native coronary artery with unspecified angina pectoris: Secondary | ICD-10-CM | POA: Diagnosis not present

## 2015-07-30 DIAGNOSIS — I5030 Unspecified diastolic (congestive) heart failure: Secondary | ICD-10-CM | POA: Diagnosis not present

## 2015-07-30 DIAGNOSIS — I739 Peripheral vascular disease, unspecified: Secondary | ICD-10-CM | POA: Diagnosis not present

## 2015-07-30 MED ORDER — ONDANSETRON 4 MG PO TBDP: 4.0000 mg | ORAL_TABLET | Freq: Three times a day (TID) | ORAL | Status: AC | PRN

## 2015-07-30 NOTE — Telephone Encounter (Signed)
Spoke with Patty to inform.

## 2015-07-30 NOTE — Telephone Encounter (Signed)
[place]:[id number] [facility] Nurse Patient is having some nausea. Can you call something in for him. Kristopher Oppenheim on [address]

## 2015-07-30 NOTE — Telephone Encounter (Signed)
rx sent

## 2015-07-30 NOTE — Telephone Encounter (Signed)
Please advise 

## 2015-08-01 ENCOUNTER — Telehealth: Payer: Self-pay

## 2015-08-01 DIAGNOSIS — I313 Pericardial effusion (noninflammatory): Secondary | ICD-10-CM | POA: Diagnosis not present

## 2015-08-01 DIAGNOSIS — I739 Peripheral vascular disease, unspecified: Secondary | ICD-10-CM | POA: Diagnosis not present

## 2015-08-01 DIAGNOSIS — E1159 Type 2 diabetes mellitus with other circulatory complications: Secondary | ICD-10-CM | POA: Diagnosis not present

## 2015-08-01 DIAGNOSIS — I5030 Unspecified diastolic (congestive) heart failure: Secondary | ICD-10-CM | POA: Diagnosis not present

## 2015-08-01 DIAGNOSIS — I25119 Atherosclerotic heart disease of native coronary artery with unspecified angina pectoris: Secondary | ICD-10-CM | POA: Diagnosis not present

## 2015-08-01 DIAGNOSIS — N189 Chronic kidney disease, unspecified: Secondary | ICD-10-CM | POA: Diagnosis not present

## 2015-08-01 NOTE — Telephone Encounter (Signed)
FMLA updated and re-faxed - see phone note dated 06/04/2015

## 2015-08-01 NOTE — Telephone Encounter (Signed)
Question #4 of FMLA updated to "continuous" per pt's daughter request and refaxed

## 2015-08-01 NOTE — Telephone Encounter (Signed)
Patients daughter [name] called and said that she will be faxing papers over, it is for her job with being out with her sick dad. Just and FYI.Marland Kitchen

## 2015-08-01 NOTE — Telephone Encounter (Signed)
Noted  

## 2015-08-02 DIAGNOSIS — I739 Peripheral vascular disease, unspecified: Secondary | ICD-10-CM | POA: Diagnosis not present

## 2015-08-02 DIAGNOSIS — I313 Pericardial effusion (noninflammatory): Secondary | ICD-10-CM | POA: Diagnosis not present

## 2015-08-02 DIAGNOSIS — I5030 Unspecified diastolic (congestive) heart failure: Secondary | ICD-10-CM | POA: Diagnosis not present

## 2015-08-02 DIAGNOSIS — I25119 Atherosclerotic heart disease of native coronary artery with unspecified angina pectoris: Secondary | ICD-10-CM | POA: Diagnosis not present

## 2015-08-02 DIAGNOSIS — N189 Chronic kidney disease, unspecified: Secondary | ICD-10-CM | POA: Diagnosis not present

## 2015-08-02 DIAGNOSIS — E1159 Type 2 diabetes mellitus with other circulatory complications: Secondary | ICD-10-CM | POA: Diagnosis not present

## 2015-08-06 DIAGNOSIS — I313 Pericardial effusion (noninflammatory): Secondary | ICD-10-CM | POA: Diagnosis not present

## 2015-08-06 DIAGNOSIS — I25119 Atherosclerotic heart disease of native coronary artery with unspecified angina pectoris: Secondary | ICD-10-CM | POA: Diagnosis not present

## 2015-08-06 DIAGNOSIS — I739 Peripheral vascular disease, unspecified: Secondary | ICD-10-CM | POA: Diagnosis not present

## 2015-08-06 DIAGNOSIS — E1159 Type 2 diabetes mellitus with other circulatory complications: Secondary | ICD-10-CM | POA: Diagnosis not present

## 2015-08-06 DIAGNOSIS — N189 Chronic kidney disease, unspecified: Secondary | ICD-10-CM | POA: Diagnosis not present

## 2015-08-06 DIAGNOSIS — I5030 Unspecified diastolic (congestive) heart failure: Secondary | ICD-10-CM | POA: Diagnosis not present

## 2015-08-07 ENCOUNTER — Telehealth: Payer: Self-pay

## 2015-08-07 DIAGNOSIS — I5030 Unspecified diastolic (congestive) heart failure: Secondary | ICD-10-CM | POA: Diagnosis not present

## 2015-08-07 DIAGNOSIS — I313 Pericardial effusion (noninflammatory): Secondary | ICD-10-CM | POA: Diagnosis not present

## 2015-08-07 DIAGNOSIS — N189 Chronic kidney disease, unspecified: Secondary | ICD-10-CM | POA: Diagnosis not present

## 2015-08-07 DIAGNOSIS — I25119 Atherosclerotic heart disease of native coronary artery with unspecified angina pectoris: Secondary | ICD-10-CM | POA: Diagnosis not present

## 2015-08-07 DIAGNOSIS — I739 Peripheral vascular disease, unspecified: Secondary | ICD-10-CM | POA: Diagnosis not present

## 2015-08-07 DIAGNOSIS — E1159 Type 2 diabetes mellitus with other circulatory complications: Secondary | ICD-10-CM | POA: Diagnosis not present

## 2015-08-07 NOTE — Telephone Encounter (Signed)
[place] [phone]  He is will federal services and he has some questions about this patient. And what his care is. Such as she is still on hospice? Can you follow up, Thank you.

## 2015-08-07 NOTE — Telephone Encounter (Signed)
LVM for pt to call back as soon as possible.   RE: must verify who Luis Glenn is with.

## 2015-08-08 DIAGNOSIS — I25119 Atherosclerotic heart disease of native coronary artery with unspecified angina pectoris: Secondary | ICD-10-CM | POA: Diagnosis not present

## 2015-08-08 DIAGNOSIS — I739 Peripheral vascular disease, unspecified: Secondary | ICD-10-CM | POA: Diagnosis not present

## 2015-08-08 DIAGNOSIS — N189 Chronic kidney disease, unspecified: Secondary | ICD-10-CM | POA: Diagnosis not present

## 2015-08-08 DIAGNOSIS — I313 Pericardial effusion (noninflammatory): Secondary | ICD-10-CM | POA: Diagnosis not present

## 2015-08-08 DIAGNOSIS — I5030 Unspecified diastolic (congestive) heart failure: Secondary | ICD-10-CM | POA: Diagnosis not present

## 2015-08-08 DIAGNOSIS — E1159 Type 2 diabetes mellitus with other circulatory complications: Secondary | ICD-10-CM | POA: Diagnosis not present

## 2015-08-09 NOTE — Telephone Encounter (Signed)
Luis Glenn stated on vm stating that he received an appeal with contradicting information and needed clarification.   Number to reach [place]: [phone]

## 2015-08-10 DIAGNOSIS — I739 Peripheral vascular disease, unspecified: Secondary | ICD-10-CM | POA: Diagnosis not present

## 2015-08-10 DIAGNOSIS — E1159 Type 2 diabetes mellitus with other circulatory complications: Secondary | ICD-10-CM | POA: Diagnosis not present

## 2015-08-10 DIAGNOSIS — I313 Pericardial effusion (noninflammatory): Secondary | ICD-10-CM | POA: Diagnosis not present

## 2015-08-10 DIAGNOSIS — M109 Gout, unspecified: Secondary | ICD-10-CM | POA: Diagnosis not present

## 2015-08-10 DIAGNOSIS — E785 Hyperlipidemia, unspecified: Secondary | ICD-10-CM | POA: Diagnosis not present

## 2015-08-10 DIAGNOSIS — J301 Allergic rhinitis due to pollen: Secondary | ICD-10-CM | POA: Diagnosis not present

## 2015-08-10 DIAGNOSIS — I10 Essential (primary) hypertension: Secondary | ICD-10-CM | POA: Diagnosis not present

## 2015-08-10 DIAGNOSIS — I4891 Unspecified atrial fibrillation: Secondary | ICD-10-CM | POA: Diagnosis not present

## 2015-08-10 DIAGNOSIS — I25119 Atherosclerotic heart disease of native coronary artery with unspecified angina pectoris: Secondary | ICD-10-CM | POA: Diagnosis not present

## 2015-08-10 DIAGNOSIS — I5030 Unspecified diastolic (congestive) heart failure: Secondary | ICD-10-CM | POA: Diagnosis not present

## 2015-08-10 DIAGNOSIS — N189 Chronic kidney disease, unspecified: Secondary | ICD-10-CM | POA: Diagnosis not present

## 2015-08-10 DIAGNOSIS — N401 Enlarged prostate with lower urinary tract symptoms: Secondary | ICD-10-CM | POA: Diagnosis not present

## 2015-08-14 NOTE — Telephone Encounter (Signed)
I have not submitted a PA for this pt therefore I am unaware of an appeal.

## 2015-08-15 DIAGNOSIS — N189 Chronic kidney disease, unspecified: Secondary | ICD-10-CM | POA: Diagnosis not present

## 2015-08-15 DIAGNOSIS — E1159 Type 2 diabetes mellitus with other circulatory complications: Secondary | ICD-10-CM | POA: Diagnosis not present

## 2015-08-15 DIAGNOSIS — I739 Peripheral vascular disease, unspecified: Secondary | ICD-10-CM | POA: Diagnosis not present

## 2015-08-15 DIAGNOSIS — I313 Pericardial effusion (noninflammatory): Secondary | ICD-10-CM | POA: Diagnosis not present

## 2015-08-15 DIAGNOSIS — I5030 Unspecified diastolic (congestive) heart failure: Secondary | ICD-10-CM | POA: Diagnosis not present

## 2015-08-15 DIAGNOSIS — I25119 Atherosclerotic heart disease of native coronary artery with unspecified angina pectoris: Secondary | ICD-10-CM | POA: Diagnosis not present

## 2015-08-20 DIAGNOSIS — I25119 Atherosclerotic heart disease of native coronary artery with unspecified angina pectoris: Secondary | ICD-10-CM | POA: Diagnosis not present

## 2015-08-20 DIAGNOSIS — I739 Peripheral vascular disease, unspecified: Secondary | ICD-10-CM | POA: Diagnosis not present

## 2015-08-20 DIAGNOSIS — I313 Pericardial effusion (noninflammatory): Secondary | ICD-10-CM | POA: Diagnosis not present

## 2015-08-20 DIAGNOSIS — E1159 Type 2 diabetes mellitus with other circulatory complications: Secondary | ICD-10-CM | POA: Diagnosis not present

## 2015-08-20 DIAGNOSIS — I5030 Unspecified diastolic (congestive) heart failure: Secondary | ICD-10-CM | POA: Diagnosis not present

## 2015-08-20 DIAGNOSIS — N189 Chronic kidney disease, unspecified: Secondary | ICD-10-CM | POA: Diagnosis not present

## 2015-08-22 DIAGNOSIS — I25119 Atherosclerotic heart disease of native coronary artery with unspecified angina pectoris: Secondary | ICD-10-CM | POA: Diagnosis not present

## 2015-08-22 DIAGNOSIS — N189 Chronic kidney disease, unspecified: Secondary | ICD-10-CM | POA: Diagnosis not present

## 2015-08-22 DIAGNOSIS — I739 Peripheral vascular disease, unspecified: Secondary | ICD-10-CM | POA: Diagnosis not present

## 2015-08-22 DIAGNOSIS — I313 Pericardial effusion (noninflammatory): Secondary | ICD-10-CM | POA: Diagnosis not present

## 2015-08-22 DIAGNOSIS — I5030 Unspecified diastolic (congestive) heart failure: Secondary | ICD-10-CM | POA: Diagnosis not present

## 2015-08-22 DIAGNOSIS — E1159 Type 2 diabetes mellitus with other circulatory complications: Secondary | ICD-10-CM | POA: Diagnosis not present

## 2015-08-27 DIAGNOSIS — I739 Peripheral vascular disease, unspecified: Secondary | ICD-10-CM | POA: Diagnosis not present

## 2015-08-27 DIAGNOSIS — E1159 Type 2 diabetes mellitus with other circulatory complications: Secondary | ICD-10-CM | POA: Diagnosis not present

## 2015-08-27 DIAGNOSIS — I25119 Atherosclerotic heart disease of native coronary artery with unspecified angina pectoris: Secondary | ICD-10-CM | POA: Diagnosis not present

## 2015-08-27 DIAGNOSIS — I5030 Unspecified diastolic (congestive) heart failure: Secondary | ICD-10-CM | POA: Diagnosis not present

## 2015-08-27 DIAGNOSIS — I313 Pericardial effusion (noninflammatory): Secondary | ICD-10-CM | POA: Diagnosis not present

## 2015-08-27 DIAGNOSIS — N189 Chronic kidney disease, unspecified: Secondary | ICD-10-CM | POA: Diagnosis not present

## 2015-08-29 DIAGNOSIS — E1159 Type 2 diabetes mellitus with other circulatory complications: Secondary | ICD-10-CM | POA: Diagnosis not present

## 2015-08-29 DIAGNOSIS — I313 Pericardial effusion (noninflammatory): Secondary | ICD-10-CM | POA: Diagnosis not present

## 2015-08-29 DIAGNOSIS — I739 Peripheral vascular disease, unspecified: Secondary | ICD-10-CM | POA: Diagnosis not present

## 2015-08-29 DIAGNOSIS — I25119 Atherosclerotic heart disease of native coronary artery with unspecified angina pectoris: Secondary | ICD-10-CM | POA: Diagnosis not present

## 2015-08-29 DIAGNOSIS — I5030 Unspecified diastolic (congestive) heart failure: Secondary | ICD-10-CM | POA: Diagnosis not present

## 2015-08-29 DIAGNOSIS — N189 Chronic kidney disease, unspecified: Secondary | ICD-10-CM | POA: Diagnosis not present

## 2015-09-02 DIAGNOSIS — I313 Pericardial effusion (noninflammatory): Secondary | ICD-10-CM | POA: Diagnosis not present

## 2015-09-02 DIAGNOSIS — E1159 Type 2 diabetes mellitus with other circulatory complications: Secondary | ICD-10-CM | POA: Diagnosis not present

## 2015-09-02 DIAGNOSIS — I5030 Unspecified diastolic (congestive) heart failure: Secondary | ICD-10-CM | POA: Diagnosis not present

## 2015-09-02 DIAGNOSIS — I25119 Atherosclerotic heart disease of native coronary artery with unspecified angina pectoris: Secondary | ICD-10-CM | POA: Diagnosis not present

## 2015-09-02 DIAGNOSIS — I739 Peripheral vascular disease, unspecified: Secondary | ICD-10-CM | POA: Diagnosis not present

## 2015-09-02 DIAGNOSIS — N189 Chronic kidney disease, unspecified: Secondary | ICD-10-CM | POA: Diagnosis not present

## 2015-09-03 DIAGNOSIS — I5030 Unspecified diastolic (congestive) heart failure: Secondary | ICD-10-CM | POA: Diagnosis not present

## 2015-09-03 DIAGNOSIS — I739 Peripheral vascular disease, unspecified: Secondary | ICD-10-CM | POA: Diagnosis not present

## 2015-09-03 DIAGNOSIS — E1159 Type 2 diabetes mellitus with other circulatory complications: Secondary | ICD-10-CM | POA: Diagnosis not present

## 2015-09-03 DIAGNOSIS — I25119 Atherosclerotic heart disease of native coronary artery with unspecified angina pectoris: Secondary | ICD-10-CM | POA: Diagnosis not present

## 2015-09-03 DIAGNOSIS — I313 Pericardial effusion (noninflammatory): Secondary | ICD-10-CM | POA: Diagnosis not present

## 2015-09-03 DIAGNOSIS — N189 Chronic kidney disease, unspecified: Secondary | ICD-10-CM | POA: Diagnosis not present

## 2015-09-05 DIAGNOSIS — I25119 Atherosclerotic heart disease of native coronary artery with unspecified angina pectoris: Secondary | ICD-10-CM | POA: Diagnosis not present

## 2015-09-05 DIAGNOSIS — I739 Peripheral vascular disease, unspecified: Secondary | ICD-10-CM | POA: Diagnosis not present

## 2015-09-05 DIAGNOSIS — I313 Pericardial effusion (noninflammatory): Secondary | ICD-10-CM | POA: Diagnosis not present

## 2015-09-05 DIAGNOSIS — I5030 Unspecified diastolic (congestive) heart failure: Secondary | ICD-10-CM | POA: Diagnosis not present

## 2015-09-05 DIAGNOSIS — E1159 Type 2 diabetes mellitus with other circulatory complications: Secondary | ICD-10-CM | POA: Diagnosis not present

## 2015-09-05 DIAGNOSIS — N189 Chronic kidney disease, unspecified: Secondary | ICD-10-CM | POA: Diagnosis not present

## 2015-09-10 DIAGNOSIS — I10 Essential (primary) hypertension: Secondary | ICD-10-CM | POA: Diagnosis not present

## 2015-09-10 DIAGNOSIS — I25119 Atherosclerotic heart disease of native coronary artery with unspecified angina pectoris: Secondary | ICD-10-CM | POA: Diagnosis not present

## 2015-09-10 DIAGNOSIS — E785 Hyperlipidemia, unspecified: Secondary | ICD-10-CM | POA: Diagnosis not present

## 2015-09-10 DIAGNOSIS — I4891 Unspecified atrial fibrillation: Secondary | ICD-10-CM | POA: Diagnosis not present

## 2015-09-10 DIAGNOSIS — N189 Chronic kidney disease, unspecified: Secondary | ICD-10-CM | POA: Diagnosis not present

## 2015-09-10 DIAGNOSIS — I313 Pericardial effusion (noninflammatory): Secondary | ICD-10-CM | POA: Diagnosis not present

## 2015-09-10 DIAGNOSIS — I739 Peripheral vascular disease, unspecified: Secondary | ICD-10-CM | POA: Diagnosis not present

## 2015-09-10 DIAGNOSIS — J301 Allergic rhinitis due to pollen: Secondary | ICD-10-CM | POA: Diagnosis not present

## 2015-09-10 DIAGNOSIS — E1159 Type 2 diabetes mellitus with other circulatory complications: Secondary | ICD-10-CM | POA: Diagnosis not present

## 2015-09-10 DIAGNOSIS — M109 Gout, unspecified: Secondary | ICD-10-CM | POA: Diagnosis not present

## 2015-09-10 DIAGNOSIS — I5030 Unspecified diastolic (congestive) heart failure: Secondary | ICD-10-CM | POA: Diagnosis not present

## 2015-09-10 DIAGNOSIS — N401 Enlarged prostate with lower urinary tract symptoms: Secondary | ICD-10-CM | POA: Diagnosis not present

## 2015-09-12 DIAGNOSIS — I5030 Unspecified diastolic (congestive) heart failure: Secondary | ICD-10-CM | POA: Diagnosis not present

## 2015-09-12 DIAGNOSIS — I313 Pericardial effusion (noninflammatory): Secondary | ICD-10-CM | POA: Diagnosis not present

## 2015-09-12 DIAGNOSIS — N189 Chronic kidney disease, unspecified: Secondary | ICD-10-CM | POA: Diagnosis not present

## 2015-09-12 DIAGNOSIS — E1159 Type 2 diabetes mellitus with other circulatory complications: Secondary | ICD-10-CM | POA: Diagnosis not present

## 2015-09-12 DIAGNOSIS — I739 Peripheral vascular disease, unspecified: Secondary | ICD-10-CM | POA: Diagnosis not present

## 2015-09-12 DIAGNOSIS — I25119 Atherosclerotic heart disease of native coronary artery with unspecified angina pectoris: Secondary | ICD-10-CM | POA: Diagnosis not present

## 2015-09-17 DIAGNOSIS — I739 Peripheral vascular disease, unspecified: Secondary | ICD-10-CM | POA: Diagnosis not present

## 2015-09-17 DIAGNOSIS — N189 Chronic kidney disease, unspecified: Secondary | ICD-10-CM | POA: Diagnosis not present

## 2015-09-17 DIAGNOSIS — I5030 Unspecified diastolic (congestive) heart failure: Secondary | ICD-10-CM | POA: Diagnosis not present

## 2015-09-17 DIAGNOSIS — I25119 Atherosclerotic heart disease of native coronary artery with unspecified angina pectoris: Secondary | ICD-10-CM | POA: Diagnosis not present

## 2015-09-17 DIAGNOSIS — E1159 Type 2 diabetes mellitus with other circulatory complications: Secondary | ICD-10-CM | POA: Diagnosis not present

## 2015-09-17 DIAGNOSIS — I313 Pericardial effusion (noninflammatory): Secondary | ICD-10-CM | POA: Diagnosis not present

## 2015-09-18 DIAGNOSIS — I5030 Unspecified diastolic (congestive) heart failure: Secondary | ICD-10-CM | POA: Diagnosis not present

## 2015-09-18 DIAGNOSIS — I313 Pericardial effusion (noninflammatory): Secondary | ICD-10-CM | POA: Diagnosis not present

## 2015-09-18 DIAGNOSIS — E1159 Type 2 diabetes mellitus with other circulatory complications: Secondary | ICD-10-CM | POA: Diagnosis not present

## 2015-09-18 DIAGNOSIS — I739 Peripheral vascular disease, unspecified: Secondary | ICD-10-CM | POA: Diagnosis not present

## 2015-09-18 DIAGNOSIS — I25119 Atherosclerotic heart disease of native coronary artery with unspecified angina pectoris: Secondary | ICD-10-CM | POA: Diagnosis not present

## 2015-09-18 DIAGNOSIS — N189 Chronic kidney disease, unspecified: Secondary | ICD-10-CM | POA: Diagnosis not present

## 2015-09-19 DIAGNOSIS — I739 Peripheral vascular disease, unspecified: Secondary | ICD-10-CM | POA: Diagnosis not present

## 2015-09-19 DIAGNOSIS — I5030 Unspecified diastolic (congestive) heart failure: Secondary | ICD-10-CM | POA: Diagnosis not present

## 2015-09-19 DIAGNOSIS — N189 Chronic kidney disease, unspecified: Secondary | ICD-10-CM | POA: Diagnosis not present

## 2015-09-19 DIAGNOSIS — I25119 Atherosclerotic heart disease of native coronary artery with unspecified angina pectoris: Secondary | ICD-10-CM | POA: Diagnosis not present

## 2015-09-19 DIAGNOSIS — E1159 Type 2 diabetes mellitus with other circulatory complications: Secondary | ICD-10-CM | POA: Diagnosis not present

## 2015-09-19 DIAGNOSIS — I313 Pericardial effusion (noninflammatory): Secondary | ICD-10-CM | POA: Diagnosis not present

## 2015-09-24 DIAGNOSIS — N189 Chronic kidney disease, unspecified: Secondary | ICD-10-CM | POA: Diagnosis not present

## 2015-09-24 DIAGNOSIS — I739 Peripheral vascular disease, unspecified: Secondary | ICD-10-CM | POA: Diagnosis not present

## 2015-09-24 DIAGNOSIS — E1159 Type 2 diabetes mellitus with other circulatory complications: Secondary | ICD-10-CM | POA: Diagnosis not present

## 2015-09-24 DIAGNOSIS — I5030 Unspecified diastolic (congestive) heart failure: Secondary | ICD-10-CM | POA: Diagnosis not present

## 2015-09-24 DIAGNOSIS — I25119 Atherosclerotic heart disease of native coronary artery with unspecified angina pectoris: Secondary | ICD-10-CM | POA: Diagnosis not present

## 2015-09-24 DIAGNOSIS — I313 Pericardial effusion (noninflammatory): Secondary | ICD-10-CM | POA: Diagnosis not present

## 2015-09-26 DIAGNOSIS — I5030 Unspecified diastolic (congestive) heart failure: Secondary | ICD-10-CM | POA: Diagnosis not present

## 2015-09-26 DIAGNOSIS — I313 Pericardial effusion (noninflammatory): Secondary | ICD-10-CM | POA: Diagnosis not present

## 2015-09-26 DIAGNOSIS — I739 Peripheral vascular disease, unspecified: Secondary | ICD-10-CM | POA: Diagnosis not present

## 2015-09-26 DIAGNOSIS — E1159 Type 2 diabetes mellitus with other circulatory complications: Secondary | ICD-10-CM | POA: Diagnosis not present

## 2015-09-26 DIAGNOSIS — I25119 Atherosclerotic heart disease of native coronary artery with unspecified angina pectoris: Secondary | ICD-10-CM | POA: Diagnosis not present

## 2015-09-26 DIAGNOSIS — N189 Chronic kidney disease, unspecified: Secondary | ICD-10-CM | POA: Diagnosis not present

## 2015-10-01 DIAGNOSIS — E1159 Type 2 diabetes mellitus with other circulatory complications: Secondary | ICD-10-CM | POA: Diagnosis not present

## 2015-10-01 DIAGNOSIS — I739 Peripheral vascular disease, unspecified: Secondary | ICD-10-CM | POA: Diagnosis not present

## 2015-10-01 DIAGNOSIS — I5030 Unspecified diastolic (congestive) heart failure: Secondary | ICD-10-CM | POA: Diagnosis not present

## 2015-10-01 DIAGNOSIS — I313 Pericardial effusion (noninflammatory): Secondary | ICD-10-CM | POA: Diagnosis not present

## 2015-10-01 DIAGNOSIS — N189 Chronic kidney disease, unspecified: Secondary | ICD-10-CM | POA: Diagnosis not present

## 2015-10-01 DIAGNOSIS — I25119 Atherosclerotic heart disease of native coronary artery with unspecified angina pectoris: Secondary | ICD-10-CM | POA: Diagnosis not present

## 2015-10-03 DIAGNOSIS — I5030 Unspecified diastolic (congestive) heart failure: Secondary | ICD-10-CM | POA: Diagnosis not present

## 2015-10-03 DIAGNOSIS — I25119 Atherosclerotic heart disease of native coronary artery with unspecified angina pectoris: Secondary | ICD-10-CM | POA: Diagnosis not present

## 2015-10-03 DIAGNOSIS — N189 Chronic kidney disease, unspecified: Secondary | ICD-10-CM | POA: Diagnosis not present

## 2015-10-03 DIAGNOSIS — I739 Peripheral vascular disease, unspecified: Secondary | ICD-10-CM | POA: Diagnosis not present

## 2015-10-03 DIAGNOSIS — E1159 Type 2 diabetes mellitus with other circulatory complications: Secondary | ICD-10-CM | POA: Diagnosis not present

## 2015-10-03 DIAGNOSIS — I313 Pericardial effusion (noninflammatory): Secondary | ICD-10-CM | POA: Diagnosis not present

## 2015-10-08 DIAGNOSIS — I739 Peripheral vascular disease, unspecified: Secondary | ICD-10-CM | POA: Diagnosis not present

## 2015-10-08 DIAGNOSIS — I313 Pericardial effusion (noninflammatory): Secondary | ICD-10-CM | POA: Diagnosis not present

## 2015-10-08 DIAGNOSIS — N189 Chronic kidney disease, unspecified: Secondary | ICD-10-CM | POA: Diagnosis not present

## 2015-10-08 DIAGNOSIS — I5030 Unspecified diastolic (congestive) heart failure: Secondary | ICD-10-CM | POA: Diagnosis not present

## 2015-10-08 DIAGNOSIS — E1159 Type 2 diabetes mellitus with other circulatory complications: Secondary | ICD-10-CM | POA: Diagnosis not present

## 2015-10-08 DIAGNOSIS — I25119 Atherosclerotic heart disease of native coronary artery with unspecified angina pectoris: Secondary | ICD-10-CM | POA: Diagnosis not present

## 2015-10-10 DIAGNOSIS — N189 Chronic kidney disease, unspecified: Secondary | ICD-10-CM | POA: Diagnosis not present

## 2015-10-10 DIAGNOSIS — I25119 Atherosclerotic heart disease of native coronary artery with unspecified angina pectoris: Secondary | ICD-10-CM | POA: Diagnosis not present

## 2015-10-10 DIAGNOSIS — E1159 Type 2 diabetes mellitus with other circulatory complications: Secondary | ICD-10-CM | POA: Diagnosis not present

## 2015-10-10 DIAGNOSIS — I313 Pericardial effusion (noninflammatory): Secondary | ICD-10-CM | POA: Diagnosis not present

## 2015-10-10 DIAGNOSIS — I5030 Unspecified diastolic (congestive) heart failure: Secondary | ICD-10-CM | POA: Diagnosis not present

## 2015-10-10 DIAGNOSIS — I739 Peripheral vascular disease, unspecified: Secondary | ICD-10-CM | POA: Diagnosis not present

## 2015-10-11 DIAGNOSIS — E785 Hyperlipidemia, unspecified: Secondary | ICD-10-CM | POA: Diagnosis not present

## 2015-10-11 DIAGNOSIS — I10 Essential (primary) hypertension: Secondary | ICD-10-CM | POA: Diagnosis not present

## 2015-10-11 DIAGNOSIS — I313 Pericardial effusion (noninflammatory): Secondary | ICD-10-CM | POA: Diagnosis not present

## 2015-10-11 DIAGNOSIS — N401 Enlarged prostate with lower urinary tract symptoms: Secondary | ICD-10-CM | POA: Diagnosis not present

## 2015-10-11 DIAGNOSIS — J301 Allergic rhinitis due to pollen: Secondary | ICD-10-CM | POA: Diagnosis not present

## 2015-10-11 DIAGNOSIS — N189 Chronic kidney disease, unspecified: Secondary | ICD-10-CM | POA: Diagnosis not present

## 2015-10-11 DIAGNOSIS — I25119 Atherosclerotic heart disease of native coronary artery with unspecified angina pectoris: Secondary | ICD-10-CM | POA: Diagnosis not present

## 2015-10-11 DIAGNOSIS — I5030 Unspecified diastolic (congestive) heart failure: Secondary | ICD-10-CM | POA: Diagnosis not present

## 2015-10-11 DIAGNOSIS — I739 Peripheral vascular disease, unspecified: Secondary | ICD-10-CM | POA: Diagnosis not present

## 2015-10-11 DIAGNOSIS — I4891 Unspecified atrial fibrillation: Secondary | ICD-10-CM | POA: Diagnosis not present

## 2015-10-11 DIAGNOSIS — E1159 Type 2 diabetes mellitus with other circulatory complications: Secondary | ICD-10-CM | POA: Diagnosis not present

## 2015-10-11 DIAGNOSIS — M109 Gout, unspecified: Secondary | ICD-10-CM | POA: Diagnosis not present

## 2015-10-15 DIAGNOSIS — N189 Chronic kidney disease, unspecified: Secondary | ICD-10-CM | POA: Diagnosis not present

## 2015-10-15 DIAGNOSIS — I739 Peripheral vascular disease, unspecified: Secondary | ICD-10-CM | POA: Diagnosis not present

## 2015-10-15 DIAGNOSIS — E1159 Type 2 diabetes mellitus with other circulatory complications: Secondary | ICD-10-CM | POA: Diagnosis not present

## 2015-10-15 DIAGNOSIS — I25119 Atherosclerotic heart disease of native coronary artery with unspecified angina pectoris: Secondary | ICD-10-CM | POA: Diagnosis not present

## 2015-10-15 DIAGNOSIS — I313 Pericardial effusion (noninflammatory): Secondary | ICD-10-CM | POA: Diagnosis not present

## 2015-10-15 DIAGNOSIS — I5030 Unspecified diastolic (congestive) heart failure: Secondary | ICD-10-CM | POA: Diagnosis not present

## 2015-10-17 DIAGNOSIS — I739 Peripheral vascular disease, unspecified: Secondary | ICD-10-CM | POA: Diagnosis not present

## 2015-10-17 DIAGNOSIS — I5030 Unspecified diastolic (congestive) heart failure: Secondary | ICD-10-CM | POA: Diagnosis not present

## 2015-10-17 DIAGNOSIS — I313 Pericardial effusion (noninflammatory): Secondary | ICD-10-CM | POA: Diagnosis not present

## 2015-10-17 DIAGNOSIS — I25119 Atherosclerotic heart disease of native coronary artery with unspecified angina pectoris: Secondary | ICD-10-CM | POA: Diagnosis not present

## 2015-10-17 DIAGNOSIS — N189 Chronic kidney disease, unspecified: Secondary | ICD-10-CM | POA: Diagnosis not present

## 2015-10-17 DIAGNOSIS — E1159 Type 2 diabetes mellitus with other circulatory complications: Secondary | ICD-10-CM | POA: Diagnosis not present

## 2015-10-18 DIAGNOSIS — I313 Pericardial effusion (noninflammatory): Secondary | ICD-10-CM | POA: Diagnosis not present

## 2015-10-18 DIAGNOSIS — I25119 Atherosclerotic heart disease of native coronary artery with unspecified angina pectoris: Secondary | ICD-10-CM | POA: Diagnosis not present

## 2015-10-18 DIAGNOSIS — I5030 Unspecified diastolic (congestive) heart failure: Secondary | ICD-10-CM | POA: Diagnosis not present

## 2015-10-18 DIAGNOSIS — N189 Chronic kidney disease, unspecified: Secondary | ICD-10-CM | POA: Diagnosis not present

## 2015-10-18 DIAGNOSIS — I739 Peripheral vascular disease, unspecified: Secondary | ICD-10-CM | POA: Diagnosis not present

## 2015-10-18 DIAGNOSIS — E1159 Type 2 diabetes mellitus with other circulatory complications: Secondary | ICD-10-CM | POA: Diagnosis not present

## 2015-10-22 DIAGNOSIS — E1159 Type 2 diabetes mellitus with other circulatory complications: Secondary | ICD-10-CM | POA: Diagnosis not present

## 2015-10-22 DIAGNOSIS — I5030 Unspecified diastolic (congestive) heart failure: Secondary | ICD-10-CM | POA: Diagnosis not present

## 2015-10-22 DIAGNOSIS — I313 Pericardial effusion (noninflammatory): Secondary | ICD-10-CM | POA: Diagnosis not present

## 2015-10-22 DIAGNOSIS — N189 Chronic kidney disease, unspecified: Secondary | ICD-10-CM | POA: Diagnosis not present

## 2015-10-22 DIAGNOSIS — I25119 Atherosclerotic heart disease of native coronary artery with unspecified angina pectoris: Secondary | ICD-10-CM | POA: Diagnosis not present

## 2015-10-22 DIAGNOSIS — I739 Peripheral vascular disease, unspecified: Secondary | ICD-10-CM | POA: Diagnosis not present

## 2015-10-23 DIAGNOSIS — E1159 Type 2 diabetes mellitus with other circulatory complications: Secondary | ICD-10-CM | POA: Diagnosis not present

## 2015-10-23 DIAGNOSIS — I25119 Atherosclerotic heart disease of native coronary artery with unspecified angina pectoris: Secondary | ICD-10-CM | POA: Diagnosis not present

## 2015-10-23 DIAGNOSIS — I5030 Unspecified diastolic (congestive) heart failure: Secondary | ICD-10-CM | POA: Diagnosis not present

## 2015-10-23 DIAGNOSIS — I313 Pericardial effusion (noninflammatory): Secondary | ICD-10-CM | POA: Diagnosis not present

## 2015-10-23 DIAGNOSIS — I739 Peripheral vascular disease, unspecified: Secondary | ICD-10-CM | POA: Diagnosis not present

## 2015-10-23 DIAGNOSIS — N189 Chronic kidney disease, unspecified: Secondary | ICD-10-CM | POA: Diagnosis not present

## 2015-10-24 DIAGNOSIS — I5030 Unspecified diastolic (congestive) heart failure: Secondary | ICD-10-CM | POA: Diagnosis not present

## 2015-10-24 DIAGNOSIS — E1159 Type 2 diabetes mellitus with other circulatory complications: Secondary | ICD-10-CM | POA: Diagnosis not present

## 2015-10-24 DIAGNOSIS — I739 Peripheral vascular disease, unspecified: Secondary | ICD-10-CM | POA: Diagnosis not present

## 2015-10-24 DIAGNOSIS — I25119 Atherosclerotic heart disease of native coronary artery with unspecified angina pectoris: Secondary | ICD-10-CM | POA: Diagnosis not present

## 2015-10-24 DIAGNOSIS — I313 Pericardial effusion (noninflammatory): Secondary | ICD-10-CM | POA: Diagnosis not present

## 2015-10-24 DIAGNOSIS — N189 Chronic kidney disease, unspecified: Secondary | ICD-10-CM | POA: Diagnosis not present

## 2015-10-29 DIAGNOSIS — I5030 Unspecified diastolic (congestive) heart failure: Secondary | ICD-10-CM | POA: Diagnosis not present

## 2015-10-29 DIAGNOSIS — I313 Pericardial effusion (noninflammatory): Secondary | ICD-10-CM | POA: Diagnosis not present

## 2015-10-29 DIAGNOSIS — N189 Chronic kidney disease, unspecified: Secondary | ICD-10-CM | POA: Diagnosis not present

## 2015-10-29 DIAGNOSIS — I25119 Atherosclerotic heart disease of native coronary artery with unspecified angina pectoris: Secondary | ICD-10-CM | POA: Diagnosis not present

## 2015-10-29 DIAGNOSIS — E1159 Type 2 diabetes mellitus with other circulatory complications: Secondary | ICD-10-CM | POA: Diagnosis not present

## 2015-10-29 DIAGNOSIS — I739 Peripheral vascular disease, unspecified: Secondary | ICD-10-CM | POA: Diagnosis not present

## 2015-10-31 DIAGNOSIS — E1159 Type 2 diabetes mellitus with other circulatory complications: Secondary | ICD-10-CM | POA: Diagnosis not present

## 2015-10-31 DIAGNOSIS — N189 Chronic kidney disease, unspecified: Secondary | ICD-10-CM | POA: Diagnosis not present

## 2015-10-31 DIAGNOSIS — I25119 Atherosclerotic heart disease of native coronary artery with unspecified angina pectoris: Secondary | ICD-10-CM | POA: Diagnosis not present

## 2015-10-31 DIAGNOSIS — I5030 Unspecified diastolic (congestive) heart failure: Secondary | ICD-10-CM | POA: Diagnosis not present

## 2015-10-31 DIAGNOSIS — I313 Pericardial effusion (noninflammatory): Secondary | ICD-10-CM | POA: Diagnosis not present

## 2015-10-31 DIAGNOSIS — I739 Peripheral vascular disease, unspecified: Secondary | ICD-10-CM | POA: Diagnosis not present

## 2015-11-05 DIAGNOSIS — E1159 Type 2 diabetes mellitus with other circulatory complications: Secondary | ICD-10-CM | POA: Diagnosis not present

## 2015-11-05 DIAGNOSIS — I313 Pericardial effusion (noninflammatory): Secondary | ICD-10-CM | POA: Diagnosis not present

## 2015-11-05 DIAGNOSIS — I25119 Atherosclerotic heart disease of native coronary artery with unspecified angina pectoris: Secondary | ICD-10-CM | POA: Diagnosis not present

## 2015-11-05 DIAGNOSIS — I5030 Unspecified diastolic (congestive) heart failure: Secondary | ICD-10-CM | POA: Diagnosis not present

## 2015-11-05 DIAGNOSIS — I739 Peripheral vascular disease, unspecified: Secondary | ICD-10-CM | POA: Diagnosis not present

## 2015-11-05 DIAGNOSIS — N189 Chronic kidney disease, unspecified: Secondary | ICD-10-CM | POA: Diagnosis not present

## 2015-11-07 DIAGNOSIS — I5030 Unspecified diastolic (congestive) heart failure: Secondary | ICD-10-CM | POA: Diagnosis not present

## 2015-11-07 DIAGNOSIS — I25119 Atherosclerotic heart disease of native coronary artery with unspecified angina pectoris: Secondary | ICD-10-CM | POA: Diagnosis not present

## 2015-11-07 DIAGNOSIS — N189 Chronic kidney disease, unspecified: Secondary | ICD-10-CM | POA: Diagnosis not present

## 2015-11-07 DIAGNOSIS — I313 Pericardial effusion (noninflammatory): Secondary | ICD-10-CM | POA: Diagnosis not present

## 2015-11-07 DIAGNOSIS — E1159 Type 2 diabetes mellitus with other circulatory complications: Secondary | ICD-10-CM | POA: Diagnosis not present

## 2015-11-07 DIAGNOSIS — I739 Peripheral vascular disease, unspecified: Secondary | ICD-10-CM | POA: Diagnosis not present

## 2015-11-10 DIAGNOSIS — E1159 Type 2 diabetes mellitus with other circulatory complications: Secondary | ICD-10-CM | POA: Diagnosis not present

## 2015-11-10 DIAGNOSIS — I313 Pericardial effusion (noninflammatory): Secondary | ICD-10-CM | POA: Diagnosis not present

## 2015-11-10 DIAGNOSIS — I10 Essential (primary) hypertension: Secondary | ICD-10-CM | POA: Diagnosis not present

## 2015-11-10 DIAGNOSIS — I739 Peripheral vascular disease, unspecified: Secondary | ICD-10-CM | POA: Diagnosis not present

## 2015-11-10 DIAGNOSIS — I25119 Atherosclerotic heart disease of native coronary artery with unspecified angina pectoris: Secondary | ICD-10-CM | POA: Diagnosis not present

## 2015-11-10 DIAGNOSIS — N401 Enlarged prostate with lower urinary tract symptoms: Secondary | ICD-10-CM | POA: Diagnosis not present

## 2015-11-10 DIAGNOSIS — E785 Hyperlipidemia, unspecified: Secondary | ICD-10-CM | POA: Diagnosis not present

## 2015-11-10 DIAGNOSIS — I4891 Unspecified atrial fibrillation: Secondary | ICD-10-CM | POA: Diagnosis not present

## 2015-11-10 DIAGNOSIS — M109 Gout, unspecified: Secondary | ICD-10-CM | POA: Diagnosis not present

## 2015-11-10 DIAGNOSIS — J301 Allergic rhinitis due to pollen: Secondary | ICD-10-CM | POA: Diagnosis not present

## 2015-11-10 DIAGNOSIS — N189 Chronic kidney disease, unspecified: Secondary | ICD-10-CM | POA: Diagnosis not present

## 2015-11-10 DIAGNOSIS — I5030 Unspecified diastolic (congestive) heart failure: Secondary | ICD-10-CM | POA: Diagnosis not present

## 2015-11-12 DIAGNOSIS — I25119 Atherosclerotic heart disease of native coronary artery with unspecified angina pectoris: Secondary | ICD-10-CM | POA: Diagnosis not present

## 2015-11-12 DIAGNOSIS — I5030 Unspecified diastolic (congestive) heart failure: Secondary | ICD-10-CM | POA: Diagnosis not present

## 2015-11-12 DIAGNOSIS — I739 Peripheral vascular disease, unspecified: Secondary | ICD-10-CM | POA: Diagnosis not present

## 2015-11-12 DIAGNOSIS — N189 Chronic kidney disease, unspecified: Secondary | ICD-10-CM | POA: Diagnosis not present

## 2015-11-12 DIAGNOSIS — E1159 Type 2 diabetes mellitus with other circulatory complications: Secondary | ICD-10-CM | POA: Diagnosis not present

## 2015-11-12 DIAGNOSIS — I313 Pericardial effusion (noninflammatory): Secondary | ICD-10-CM | POA: Diagnosis not present

## 2015-11-14 DIAGNOSIS — I313 Pericardial effusion (noninflammatory): Secondary | ICD-10-CM | POA: Diagnosis not present

## 2015-11-14 DIAGNOSIS — I25119 Atherosclerotic heart disease of native coronary artery with unspecified angina pectoris: Secondary | ICD-10-CM | POA: Diagnosis not present

## 2015-11-14 DIAGNOSIS — I5030 Unspecified diastolic (congestive) heart failure: Secondary | ICD-10-CM | POA: Diagnosis not present

## 2015-11-14 DIAGNOSIS — N189 Chronic kidney disease, unspecified: Secondary | ICD-10-CM | POA: Diagnosis not present

## 2015-11-14 DIAGNOSIS — E1159 Type 2 diabetes mellitus with other circulatory complications: Secondary | ICD-10-CM | POA: Diagnosis not present

## 2015-11-14 DIAGNOSIS — I739 Peripheral vascular disease, unspecified: Secondary | ICD-10-CM | POA: Diagnosis not present

## 2015-11-19 DIAGNOSIS — I739 Peripheral vascular disease, unspecified: Secondary | ICD-10-CM | POA: Diagnosis not present

## 2015-11-19 DIAGNOSIS — I25119 Atherosclerotic heart disease of native coronary artery with unspecified angina pectoris: Secondary | ICD-10-CM | POA: Diagnosis not present

## 2015-11-19 DIAGNOSIS — N189 Chronic kidney disease, unspecified: Secondary | ICD-10-CM | POA: Diagnosis not present

## 2015-11-19 DIAGNOSIS — I5030 Unspecified diastolic (congestive) heart failure: Secondary | ICD-10-CM | POA: Diagnosis not present

## 2015-11-19 DIAGNOSIS — E1159 Type 2 diabetes mellitus with other circulatory complications: Secondary | ICD-10-CM | POA: Diagnosis not present

## 2015-11-19 DIAGNOSIS — I313 Pericardial effusion (noninflammatory): Secondary | ICD-10-CM | POA: Diagnosis not present

## 2015-11-21 DIAGNOSIS — N189 Chronic kidney disease, unspecified: Secondary | ICD-10-CM | POA: Diagnosis not present

## 2015-11-21 DIAGNOSIS — I313 Pericardial effusion (noninflammatory): Secondary | ICD-10-CM | POA: Diagnosis not present

## 2015-11-21 DIAGNOSIS — I25119 Atherosclerotic heart disease of native coronary artery with unspecified angina pectoris: Secondary | ICD-10-CM | POA: Diagnosis not present

## 2015-11-21 DIAGNOSIS — E1159 Type 2 diabetes mellitus with other circulatory complications: Secondary | ICD-10-CM | POA: Diagnosis not present

## 2015-11-21 DIAGNOSIS — I5030 Unspecified diastolic (congestive) heart failure: Secondary | ICD-10-CM | POA: Diagnosis not present

## 2015-11-21 DIAGNOSIS — I739 Peripheral vascular disease, unspecified: Secondary | ICD-10-CM | POA: Diagnosis not present

## 2015-11-23 DIAGNOSIS — I5030 Unspecified diastolic (congestive) heart failure: Secondary | ICD-10-CM | POA: Diagnosis not present

## 2015-11-23 DIAGNOSIS — N189 Chronic kidney disease, unspecified: Secondary | ICD-10-CM | POA: Diagnosis not present

## 2015-11-23 DIAGNOSIS — E1159 Type 2 diabetes mellitus with other circulatory complications: Secondary | ICD-10-CM | POA: Diagnosis not present

## 2015-11-23 DIAGNOSIS — I313 Pericardial effusion (noninflammatory): Secondary | ICD-10-CM | POA: Diagnosis not present

## 2015-11-23 DIAGNOSIS — I739 Peripheral vascular disease, unspecified: Secondary | ICD-10-CM | POA: Diagnosis not present

## 2015-11-23 DIAGNOSIS — I25119 Atherosclerotic heart disease of native coronary artery with unspecified angina pectoris: Secondary | ICD-10-CM | POA: Diagnosis not present

## 2015-11-26 DIAGNOSIS — I313 Pericardial effusion (noninflammatory): Secondary | ICD-10-CM | POA: Diagnosis not present

## 2015-11-26 DIAGNOSIS — E1159 Type 2 diabetes mellitus with other circulatory complications: Secondary | ICD-10-CM | POA: Diagnosis not present

## 2015-11-26 DIAGNOSIS — N189 Chronic kidney disease, unspecified: Secondary | ICD-10-CM | POA: Diagnosis not present

## 2015-11-26 DIAGNOSIS — I5030 Unspecified diastolic (congestive) heart failure: Secondary | ICD-10-CM | POA: Diagnosis not present

## 2015-11-26 DIAGNOSIS — I739 Peripheral vascular disease, unspecified: Secondary | ICD-10-CM | POA: Diagnosis not present

## 2015-11-26 DIAGNOSIS — I25119 Atherosclerotic heart disease of native coronary artery with unspecified angina pectoris: Secondary | ICD-10-CM | POA: Diagnosis not present

## 2015-11-27 DIAGNOSIS — N189 Chronic kidney disease, unspecified: Secondary | ICD-10-CM | POA: Diagnosis not present

## 2015-11-27 DIAGNOSIS — I313 Pericardial effusion (noninflammatory): Secondary | ICD-10-CM | POA: Diagnosis not present

## 2015-11-27 DIAGNOSIS — E1159 Type 2 diabetes mellitus with other circulatory complications: Secondary | ICD-10-CM | POA: Diagnosis not present

## 2015-11-27 DIAGNOSIS — I25119 Atherosclerotic heart disease of native coronary artery with unspecified angina pectoris: Secondary | ICD-10-CM | POA: Diagnosis not present

## 2015-11-27 DIAGNOSIS — I5030 Unspecified diastolic (congestive) heart failure: Secondary | ICD-10-CM | POA: Diagnosis not present

## 2015-11-27 DIAGNOSIS — I739 Peripheral vascular disease, unspecified: Secondary | ICD-10-CM | POA: Diagnosis not present

## 2015-11-28 DIAGNOSIS — I25119 Atherosclerotic heart disease of native coronary artery with unspecified angina pectoris: Secondary | ICD-10-CM | POA: Diagnosis not present

## 2015-11-28 DIAGNOSIS — I313 Pericardial effusion (noninflammatory): Secondary | ICD-10-CM | POA: Diagnosis not present

## 2015-11-28 DIAGNOSIS — I5030 Unspecified diastolic (congestive) heart failure: Secondary | ICD-10-CM | POA: Diagnosis not present

## 2015-11-28 DIAGNOSIS — I739 Peripheral vascular disease, unspecified: Secondary | ICD-10-CM | POA: Diagnosis not present

## 2015-11-28 DIAGNOSIS — N189 Chronic kidney disease, unspecified: Secondary | ICD-10-CM | POA: Diagnosis not present

## 2015-11-28 DIAGNOSIS — E1159 Type 2 diabetes mellitus with other circulatory complications: Secondary | ICD-10-CM | POA: Diagnosis not present

## 2015-12-03 DIAGNOSIS — I25119 Atherosclerotic heart disease of native coronary artery with unspecified angina pectoris: Secondary | ICD-10-CM | POA: Diagnosis not present

## 2015-12-03 DIAGNOSIS — I739 Peripheral vascular disease, unspecified: Secondary | ICD-10-CM | POA: Diagnosis not present

## 2015-12-03 DIAGNOSIS — N189 Chronic kidney disease, unspecified: Secondary | ICD-10-CM | POA: Diagnosis not present

## 2015-12-03 DIAGNOSIS — I313 Pericardial effusion (noninflammatory): Secondary | ICD-10-CM | POA: Diagnosis not present

## 2015-12-03 DIAGNOSIS — I5030 Unspecified diastolic (congestive) heart failure: Secondary | ICD-10-CM | POA: Diagnosis not present

## 2015-12-03 DIAGNOSIS — E1159 Type 2 diabetes mellitus with other circulatory complications: Secondary | ICD-10-CM | POA: Diagnosis not present

## 2015-12-05 DIAGNOSIS — N189 Chronic kidney disease, unspecified: Secondary | ICD-10-CM | POA: Diagnosis not present

## 2015-12-05 DIAGNOSIS — I5030 Unspecified diastolic (congestive) heart failure: Secondary | ICD-10-CM | POA: Diagnosis not present

## 2015-12-05 DIAGNOSIS — I25119 Atherosclerotic heart disease of native coronary artery with unspecified angina pectoris: Secondary | ICD-10-CM | POA: Diagnosis not present

## 2015-12-05 DIAGNOSIS — E1159 Type 2 diabetes mellitus with other circulatory complications: Secondary | ICD-10-CM | POA: Diagnosis not present

## 2015-12-05 DIAGNOSIS — I313 Pericardial effusion (noninflammatory): Secondary | ICD-10-CM | POA: Diagnosis not present

## 2015-12-05 DIAGNOSIS — I739 Peripheral vascular disease, unspecified: Secondary | ICD-10-CM | POA: Diagnosis not present

## 2015-12-10 DIAGNOSIS — I313 Pericardial effusion (noninflammatory): Secondary | ICD-10-CM | POA: Diagnosis not present

## 2015-12-10 DIAGNOSIS — I739 Peripheral vascular disease, unspecified: Secondary | ICD-10-CM | POA: Diagnosis not present

## 2015-12-10 DIAGNOSIS — N189 Chronic kidney disease, unspecified: Secondary | ICD-10-CM | POA: Diagnosis not present

## 2015-12-10 DIAGNOSIS — I25119 Atherosclerotic heart disease of native coronary artery with unspecified angina pectoris: Secondary | ICD-10-CM | POA: Diagnosis not present

## 2015-12-10 DIAGNOSIS — I5030 Unspecified diastolic (congestive) heart failure: Secondary | ICD-10-CM | POA: Diagnosis not present

## 2015-12-10 DIAGNOSIS — E1159 Type 2 diabetes mellitus with other circulatory complications: Secondary | ICD-10-CM | POA: Diagnosis not present

## 2015-12-11 DIAGNOSIS — I10 Essential (primary) hypertension: Secondary | ICD-10-CM | POA: Diagnosis not present

## 2015-12-11 DIAGNOSIS — N401 Enlarged prostate with lower urinary tract symptoms: Secondary | ICD-10-CM | POA: Diagnosis not present

## 2015-12-11 DIAGNOSIS — I739 Peripheral vascular disease, unspecified: Secondary | ICD-10-CM | POA: Diagnosis not present

## 2015-12-11 DIAGNOSIS — E1159 Type 2 diabetes mellitus with other circulatory complications: Secondary | ICD-10-CM | POA: Diagnosis not present

## 2015-12-11 DIAGNOSIS — M109 Gout, unspecified: Secondary | ICD-10-CM | POA: Diagnosis not present

## 2015-12-11 DIAGNOSIS — I25119 Atherosclerotic heart disease of native coronary artery with unspecified angina pectoris: Secondary | ICD-10-CM | POA: Diagnosis not present

## 2015-12-11 DIAGNOSIS — E785 Hyperlipidemia, unspecified: Secondary | ICD-10-CM | POA: Diagnosis not present

## 2015-12-11 DIAGNOSIS — N189 Chronic kidney disease, unspecified: Secondary | ICD-10-CM | POA: Diagnosis not present

## 2015-12-11 DIAGNOSIS — I5030 Unspecified diastolic (congestive) heart failure: Secondary | ICD-10-CM | POA: Diagnosis not present

## 2015-12-11 DIAGNOSIS — I4891 Unspecified atrial fibrillation: Secondary | ICD-10-CM | POA: Diagnosis not present

## 2015-12-11 DIAGNOSIS — J301 Allergic rhinitis due to pollen: Secondary | ICD-10-CM | POA: Diagnosis not present

## 2015-12-11 DIAGNOSIS — I313 Pericardial effusion (noninflammatory): Secondary | ICD-10-CM | POA: Diagnosis not present

## 2015-12-12 DIAGNOSIS — E1159 Type 2 diabetes mellitus with other circulatory complications: Secondary | ICD-10-CM | POA: Diagnosis not present

## 2015-12-12 DIAGNOSIS — I25119 Atherosclerotic heart disease of native coronary artery with unspecified angina pectoris: Secondary | ICD-10-CM | POA: Diagnosis not present

## 2015-12-12 DIAGNOSIS — I5030 Unspecified diastolic (congestive) heart failure: Secondary | ICD-10-CM | POA: Diagnosis not present

## 2015-12-12 DIAGNOSIS — I313 Pericardial effusion (noninflammatory): Secondary | ICD-10-CM | POA: Diagnosis not present

## 2015-12-12 DIAGNOSIS — N189 Chronic kidney disease, unspecified: Secondary | ICD-10-CM | POA: Diagnosis not present

## 2015-12-12 DIAGNOSIS — I739 Peripheral vascular disease, unspecified: Secondary | ICD-10-CM | POA: Diagnosis not present

## 2015-12-13 DIAGNOSIS — I739 Peripheral vascular disease, unspecified: Secondary | ICD-10-CM | POA: Diagnosis not present

## 2015-12-13 DIAGNOSIS — E1159 Type 2 diabetes mellitus with other circulatory complications: Secondary | ICD-10-CM | POA: Diagnosis not present

## 2015-12-13 DIAGNOSIS — I313 Pericardial effusion (noninflammatory): Secondary | ICD-10-CM | POA: Diagnosis not present

## 2015-12-13 DIAGNOSIS — N189 Chronic kidney disease, unspecified: Secondary | ICD-10-CM | POA: Diagnosis not present

## 2015-12-13 DIAGNOSIS — I5030 Unspecified diastolic (congestive) heart failure: Secondary | ICD-10-CM | POA: Diagnosis not present

## 2015-12-13 DIAGNOSIS — I25119 Atherosclerotic heart disease of native coronary artery with unspecified angina pectoris: Secondary | ICD-10-CM | POA: Diagnosis not present

## 2015-12-17 DIAGNOSIS — I25119 Atherosclerotic heart disease of native coronary artery with unspecified angina pectoris: Secondary | ICD-10-CM | POA: Diagnosis not present

## 2015-12-17 DIAGNOSIS — N189 Chronic kidney disease, unspecified: Secondary | ICD-10-CM | POA: Diagnosis not present

## 2015-12-17 DIAGNOSIS — E1159 Type 2 diabetes mellitus with other circulatory complications: Secondary | ICD-10-CM | POA: Diagnosis not present

## 2015-12-17 DIAGNOSIS — I313 Pericardial effusion (noninflammatory): Secondary | ICD-10-CM | POA: Diagnosis not present

## 2015-12-17 DIAGNOSIS — I5030 Unspecified diastolic (congestive) heart failure: Secondary | ICD-10-CM | POA: Diagnosis not present

## 2015-12-17 DIAGNOSIS — I739 Peripheral vascular disease, unspecified: Secondary | ICD-10-CM | POA: Diagnosis not present

## 2015-12-19 DIAGNOSIS — I313 Pericardial effusion (noninflammatory): Secondary | ICD-10-CM | POA: Diagnosis not present

## 2015-12-19 DIAGNOSIS — N189 Chronic kidney disease, unspecified: Secondary | ICD-10-CM | POA: Diagnosis not present

## 2015-12-19 DIAGNOSIS — I739 Peripheral vascular disease, unspecified: Secondary | ICD-10-CM | POA: Diagnosis not present

## 2015-12-19 DIAGNOSIS — I25119 Atherosclerotic heart disease of native coronary artery with unspecified angina pectoris: Secondary | ICD-10-CM | POA: Diagnosis not present

## 2015-12-19 DIAGNOSIS — I5030 Unspecified diastolic (congestive) heart failure: Secondary | ICD-10-CM | POA: Diagnosis not present

## 2015-12-19 DIAGNOSIS — E1159 Type 2 diabetes mellitus with other circulatory complications: Secondary | ICD-10-CM | POA: Diagnosis not present

## 2015-12-23 DIAGNOSIS — I739 Peripheral vascular disease, unspecified: Secondary | ICD-10-CM | POA: Diagnosis not present

## 2015-12-23 DIAGNOSIS — I5030 Unspecified diastolic (congestive) heart failure: Secondary | ICD-10-CM | POA: Diagnosis not present

## 2015-12-23 DIAGNOSIS — I25119 Atherosclerotic heart disease of native coronary artery with unspecified angina pectoris: Secondary | ICD-10-CM | POA: Diagnosis not present

## 2015-12-23 DIAGNOSIS — I313 Pericardial effusion (noninflammatory): Secondary | ICD-10-CM | POA: Diagnosis not present

## 2015-12-23 DIAGNOSIS — E1159 Type 2 diabetes mellitus with other circulatory complications: Secondary | ICD-10-CM | POA: Diagnosis not present

## 2015-12-23 DIAGNOSIS — N189 Chronic kidney disease, unspecified: Secondary | ICD-10-CM | POA: Diagnosis not present

## 2015-12-24 ENCOUNTER — Other Ambulatory Visit: Payer: Self-pay | Admitting: Emergency Medicine

## 2015-12-24 DIAGNOSIS — I5030 Unspecified diastolic (congestive) heart failure: Secondary | ICD-10-CM | POA: Diagnosis not present

## 2015-12-24 DIAGNOSIS — E1159 Type 2 diabetes mellitus with other circulatory complications: Secondary | ICD-10-CM | POA: Diagnosis not present

## 2015-12-24 DIAGNOSIS — I313 Pericardial effusion (noninflammatory): Secondary | ICD-10-CM | POA: Diagnosis not present

## 2015-12-24 DIAGNOSIS — I739 Peripheral vascular disease, unspecified: Secondary | ICD-10-CM | POA: Diagnosis not present

## 2015-12-24 DIAGNOSIS — I25119 Atherosclerotic heart disease of native coronary artery with unspecified angina pectoris: Secondary | ICD-10-CM | POA: Diagnosis not present

## 2015-12-24 DIAGNOSIS — N189 Chronic kidney disease, unspecified: Secondary | ICD-10-CM | POA: Diagnosis not present

## 2015-12-24 MED ORDER — ALLOPURINOL 300 MG PO TABS: ORAL_TABLET | ORAL | 2 refills | Status: DC

## 2015-12-26 ENCOUNTER — Other Ambulatory Visit: Payer: Self-pay | Admitting: Emergency Medicine

## 2015-12-26 DIAGNOSIS — N189 Chronic kidney disease, unspecified: Secondary | ICD-10-CM | POA: Diagnosis not present

## 2015-12-26 DIAGNOSIS — E1159 Type 2 diabetes mellitus with other circulatory complications: Secondary | ICD-10-CM | POA: Diagnosis not present

## 2015-12-26 DIAGNOSIS — I739 Peripheral vascular disease, unspecified: Secondary | ICD-10-CM | POA: Diagnosis not present

## 2015-12-26 DIAGNOSIS — I313 Pericardial effusion (noninflammatory): Secondary | ICD-10-CM | POA: Diagnosis not present

## 2015-12-26 DIAGNOSIS — I25119 Atherosclerotic heart disease of native coronary artery with unspecified angina pectoris: Secondary | ICD-10-CM | POA: Diagnosis not present

## 2015-12-26 DIAGNOSIS — I5030 Unspecified diastolic (congestive) heart failure: Secondary | ICD-10-CM | POA: Diagnosis not present

## 2015-12-26 MED ORDER — ALLOPURINOL 300 MG PO TABS: ORAL_TABLET | ORAL | 1 refills | Status: DC

## 2015-12-31 DIAGNOSIS — I25119 Atherosclerotic heart disease of native coronary artery with unspecified angina pectoris: Secondary | ICD-10-CM | POA: Diagnosis not present

## 2015-12-31 DIAGNOSIS — I5030 Unspecified diastolic (congestive) heart failure: Secondary | ICD-10-CM | POA: Diagnosis not present

## 2015-12-31 DIAGNOSIS — N189 Chronic kidney disease, unspecified: Secondary | ICD-10-CM | POA: Diagnosis not present

## 2015-12-31 DIAGNOSIS — I739 Peripheral vascular disease, unspecified: Secondary | ICD-10-CM | POA: Diagnosis not present

## 2015-12-31 DIAGNOSIS — I313 Pericardial effusion (noninflammatory): Secondary | ICD-10-CM | POA: Diagnosis not present

## 2015-12-31 DIAGNOSIS — E1159 Type 2 diabetes mellitus with other circulatory complications: Secondary | ICD-10-CM | POA: Diagnosis not present

## 2016-01-07 DIAGNOSIS — I313 Pericardial effusion (noninflammatory): Secondary | ICD-10-CM | POA: Diagnosis not present

## 2016-01-07 DIAGNOSIS — I5030 Unspecified diastolic (congestive) heart failure: Secondary | ICD-10-CM | POA: Diagnosis not present

## 2016-01-07 DIAGNOSIS — I739 Peripheral vascular disease, unspecified: Secondary | ICD-10-CM | POA: Diagnosis not present

## 2016-01-07 DIAGNOSIS — N189 Chronic kidney disease, unspecified: Secondary | ICD-10-CM | POA: Diagnosis not present

## 2016-01-07 DIAGNOSIS — E1159 Type 2 diabetes mellitus with other circulatory complications: Secondary | ICD-10-CM | POA: Diagnosis not present

## 2016-01-07 DIAGNOSIS — I25119 Atherosclerotic heart disease of native coronary artery with unspecified angina pectoris: Secondary | ICD-10-CM | POA: Diagnosis not present

## 2016-01-09 DIAGNOSIS — I25119 Atherosclerotic heart disease of native coronary artery with unspecified angina pectoris: Secondary | ICD-10-CM | POA: Diagnosis not present

## 2016-01-09 DIAGNOSIS — E1159 Type 2 diabetes mellitus with other circulatory complications: Secondary | ICD-10-CM | POA: Diagnosis not present

## 2016-01-09 DIAGNOSIS — N189 Chronic kidney disease, unspecified: Secondary | ICD-10-CM | POA: Diagnosis not present

## 2016-01-09 DIAGNOSIS — I739 Peripheral vascular disease, unspecified: Secondary | ICD-10-CM | POA: Diagnosis not present

## 2016-01-09 DIAGNOSIS — I5030 Unspecified diastolic (congestive) heart failure: Secondary | ICD-10-CM | POA: Diagnosis not present

## 2016-01-09 DIAGNOSIS — I313 Pericardial effusion (noninflammatory): Secondary | ICD-10-CM | POA: Diagnosis not present

## 2016-01-10 DIAGNOSIS — N189 Chronic kidney disease, unspecified: Secondary | ICD-10-CM | POA: Diagnosis not present

## 2016-01-10 DIAGNOSIS — I4891 Unspecified atrial fibrillation: Secondary | ICD-10-CM | POA: Diagnosis not present

## 2016-01-10 DIAGNOSIS — I5030 Unspecified diastolic (congestive) heart failure: Secondary | ICD-10-CM | POA: Diagnosis not present

## 2016-01-10 DIAGNOSIS — J301 Allergic rhinitis due to pollen: Secondary | ICD-10-CM | POA: Diagnosis not present

## 2016-01-10 DIAGNOSIS — E785 Hyperlipidemia, unspecified: Secondary | ICD-10-CM | POA: Diagnosis not present

## 2016-01-10 DIAGNOSIS — M109 Gout, unspecified: Secondary | ICD-10-CM | POA: Diagnosis not present

## 2016-01-10 DIAGNOSIS — I739 Peripheral vascular disease, unspecified: Secondary | ICD-10-CM | POA: Diagnosis not present

## 2016-01-10 DIAGNOSIS — N401 Enlarged prostate with lower urinary tract symptoms: Secondary | ICD-10-CM | POA: Diagnosis not present

## 2016-01-10 DIAGNOSIS — I10 Essential (primary) hypertension: Secondary | ICD-10-CM | POA: Diagnosis not present

## 2016-01-10 DIAGNOSIS — E1159 Type 2 diabetes mellitus with other circulatory complications: Secondary | ICD-10-CM | POA: Diagnosis not present

## 2016-01-10 DIAGNOSIS — I25119 Atherosclerotic heart disease of native coronary artery with unspecified angina pectoris: Secondary | ICD-10-CM | POA: Diagnosis not present

## 2016-01-10 DIAGNOSIS — I313 Pericardial effusion (noninflammatory): Secondary | ICD-10-CM | POA: Diagnosis not present

## 2016-01-14 DIAGNOSIS — I5030 Unspecified diastolic (congestive) heart failure: Secondary | ICD-10-CM | POA: Diagnosis not present

## 2016-01-14 DIAGNOSIS — I25119 Atherosclerotic heart disease of native coronary artery with unspecified angina pectoris: Secondary | ICD-10-CM | POA: Diagnosis not present

## 2016-01-14 DIAGNOSIS — I739 Peripheral vascular disease, unspecified: Secondary | ICD-10-CM | POA: Diagnosis not present

## 2016-01-14 DIAGNOSIS — I313 Pericardial effusion (noninflammatory): Secondary | ICD-10-CM | POA: Diagnosis not present

## 2016-01-14 DIAGNOSIS — N189 Chronic kidney disease, unspecified: Secondary | ICD-10-CM | POA: Diagnosis not present

## 2016-01-14 DIAGNOSIS — E1159 Type 2 diabetes mellitus with other circulatory complications: Secondary | ICD-10-CM | POA: Diagnosis not present

## 2016-01-15 DIAGNOSIS — N189 Chronic kidney disease, unspecified: Secondary | ICD-10-CM | POA: Diagnosis not present

## 2016-01-15 DIAGNOSIS — I25119 Atherosclerotic heart disease of native coronary artery with unspecified angina pectoris: Secondary | ICD-10-CM | POA: Diagnosis not present

## 2016-01-15 DIAGNOSIS — I5030 Unspecified diastolic (congestive) heart failure: Secondary | ICD-10-CM | POA: Diagnosis not present

## 2016-01-15 DIAGNOSIS — E1159 Type 2 diabetes mellitus with other circulatory complications: Secondary | ICD-10-CM | POA: Diagnosis not present

## 2016-01-15 DIAGNOSIS — I313 Pericardial effusion (noninflammatory): Secondary | ICD-10-CM | POA: Diagnosis not present

## 2016-01-15 DIAGNOSIS — I739 Peripheral vascular disease, unspecified: Secondary | ICD-10-CM | POA: Diagnosis not present

## 2016-01-16 DIAGNOSIS — E1159 Type 2 diabetes mellitus with other circulatory complications: Secondary | ICD-10-CM | POA: Diagnosis not present

## 2016-01-16 DIAGNOSIS — I5030 Unspecified diastolic (congestive) heart failure: Secondary | ICD-10-CM | POA: Diagnosis not present

## 2016-01-16 DIAGNOSIS — I313 Pericardial effusion (noninflammatory): Secondary | ICD-10-CM | POA: Diagnosis not present

## 2016-01-16 DIAGNOSIS — I739 Peripheral vascular disease, unspecified: Secondary | ICD-10-CM | POA: Diagnosis not present

## 2016-01-16 DIAGNOSIS — I25119 Atherosclerotic heart disease of native coronary artery with unspecified angina pectoris: Secondary | ICD-10-CM | POA: Diagnosis not present

## 2016-01-16 DIAGNOSIS — N189 Chronic kidney disease, unspecified: Secondary | ICD-10-CM | POA: Diagnosis not present

## 2016-01-17 DIAGNOSIS — I739 Peripheral vascular disease, unspecified: Secondary | ICD-10-CM | POA: Diagnosis not present

## 2016-01-17 DIAGNOSIS — E1159 Type 2 diabetes mellitus with other circulatory complications: Secondary | ICD-10-CM | POA: Diagnosis not present

## 2016-01-17 DIAGNOSIS — I5030 Unspecified diastolic (congestive) heart failure: Secondary | ICD-10-CM | POA: Diagnosis not present

## 2016-01-17 DIAGNOSIS — I25119 Atherosclerotic heart disease of native coronary artery with unspecified angina pectoris: Secondary | ICD-10-CM | POA: Diagnosis not present

## 2016-01-17 DIAGNOSIS — N189 Chronic kidney disease, unspecified: Secondary | ICD-10-CM | POA: Diagnosis not present

## 2016-01-17 DIAGNOSIS — I313 Pericardial effusion (noninflammatory): Secondary | ICD-10-CM | POA: Diagnosis not present

## 2016-01-21 DIAGNOSIS — I739 Peripheral vascular disease, unspecified: Secondary | ICD-10-CM | POA: Diagnosis not present

## 2016-01-21 DIAGNOSIS — I25119 Atherosclerotic heart disease of native coronary artery with unspecified angina pectoris: Secondary | ICD-10-CM | POA: Diagnosis not present

## 2016-01-21 DIAGNOSIS — I313 Pericardial effusion (noninflammatory): Secondary | ICD-10-CM | POA: Diagnosis not present

## 2016-01-21 DIAGNOSIS — I5030 Unspecified diastolic (congestive) heart failure: Secondary | ICD-10-CM | POA: Diagnosis not present

## 2016-01-21 DIAGNOSIS — N189 Chronic kidney disease, unspecified: Secondary | ICD-10-CM | POA: Diagnosis not present

## 2016-01-21 DIAGNOSIS — E1159 Type 2 diabetes mellitus with other circulatory complications: Secondary | ICD-10-CM | POA: Diagnosis not present

## 2016-01-23 DIAGNOSIS — I5030 Unspecified diastolic (congestive) heart failure: Secondary | ICD-10-CM | POA: Diagnosis not present

## 2016-01-23 DIAGNOSIS — I739 Peripheral vascular disease, unspecified: Secondary | ICD-10-CM | POA: Diagnosis not present

## 2016-01-23 DIAGNOSIS — I313 Pericardial effusion (noninflammatory): Secondary | ICD-10-CM | POA: Diagnosis not present

## 2016-01-23 DIAGNOSIS — E1159 Type 2 diabetes mellitus with other circulatory complications: Secondary | ICD-10-CM | POA: Diagnosis not present

## 2016-01-23 DIAGNOSIS — N189 Chronic kidney disease, unspecified: Secondary | ICD-10-CM | POA: Diagnosis not present

## 2016-01-23 DIAGNOSIS — I25119 Atherosclerotic heart disease of native coronary artery with unspecified angina pectoris: Secondary | ICD-10-CM | POA: Diagnosis not present

## 2016-01-24 DIAGNOSIS — I5030 Unspecified diastolic (congestive) heart failure: Secondary | ICD-10-CM | POA: Diagnosis not present

## 2016-01-24 DIAGNOSIS — I25119 Atherosclerotic heart disease of native coronary artery with unspecified angina pectoris: Secondary | ICD-10-CM | POA: Diagnosis not present

## 2016-01-24 DIAGNOSIS — E1159 Type 2 diabetes mellitus with other circulatory complications: Secondary | ICD-10-CM | POA: Diagnosis not present

## 2016-01-24 DIAGNOSIS — I313 Pericardial effusion (noninflammatory): Secondary | ICD-10-CM | POA: Diagnosis not present

## 2016-01-24 DIAGNOSIS — I739 Peripheral vascular disease, unspecified: Secondary | ICD-10-CM | POA: Diagnosis not present

## 2016-01-24 DIAGNOSIS — N189 Chronic kidney disease, unspecified: Secondary | ICD-10-CM | POA: Diagnosis not present

## 2016-01-28 DIAGNOSIS — I313 Pericardial effusion (noninflammatory): Secondary | ICD-10-CM | POA: Diagnosis not present

## 2016-01-28 DIAGNOSIS — I25119 Atherosclerotic heart disease of native coronary artery with unspecified angina pectoris: Secondary | ICD-10-CM | POA: Diagnosis not present

## 2016-01-28 DIAGNOSIS — I5030 Unspecified diastolic (congestive) heart failure: Secondary | ICD-10-CM | POA: Diagnosis not present

## 2016-01-28 DIAGNOSIS — I739 Peripheral vascular disease, unspecified: Secondary | ICD-10-CM | POA: Diagnosis not present

## 2016-01-28 DIAGNOSIS — N189 Chronic kidney disease, unspecified: Secondary | ICD-10-CM | POA: Diagnosis not present

## 2016-01-28 DIAGNOSIS — E1159 Type 2 diabetes mellitus with other circulatory complications: Secondary | ICD-10-CM | POA: Diagnosis not present

## 2016-01-30 DIAGNOSIS — N189 Chronic kidney disease, unspecified: Secondary | ICD-10-CM | POA: Diagnosis not present

## 2016-01-30 DIAGNOSIS — I5030 Unspecified diastolic (congestive) heart failure: Secondary | ICD-10-CM | POA: Diagnosis not present

## 2016-01-30 DIAGNOSIS — I25119 Atherosclerotic heart disease of native coronary artery with unspecified angina pectoris: Secondary | ICD-10-CM | POA: Diagnosis not present

## 2016-01-30 DIAGNOSIS — I739 Peripheral vascular disease, unspecified: Secondary | ICD-10-CM | POA: Diagnosis not present

## 2016-01-30 DIAGNOSIS — I313 Pericardial effusion (noninflammatory): Secondary | ICD-10-CM | POA: Diagnosis not present

## 2016-01-30 DIAGNOSIS — E1159 Type 2 diabetes mellitus with other circulatory complications: Secondary | ICD-10-CM | POA: Diagnosis not present

## 2016-02-02 IMAGING — CR DG CERVICAL SPINE 2 OR 3 VIEWS
4 series · 4 of 4 positions shown · non-contrast
Comparison: Cervical spine CT 04/27/2008.

CLINICAL DATA: 86-year-old male with neck pain. Initial encounter.

EXAM:
CERVICAL SPINE - 2-3 VIEW

[view not recorded (1 of 4)]
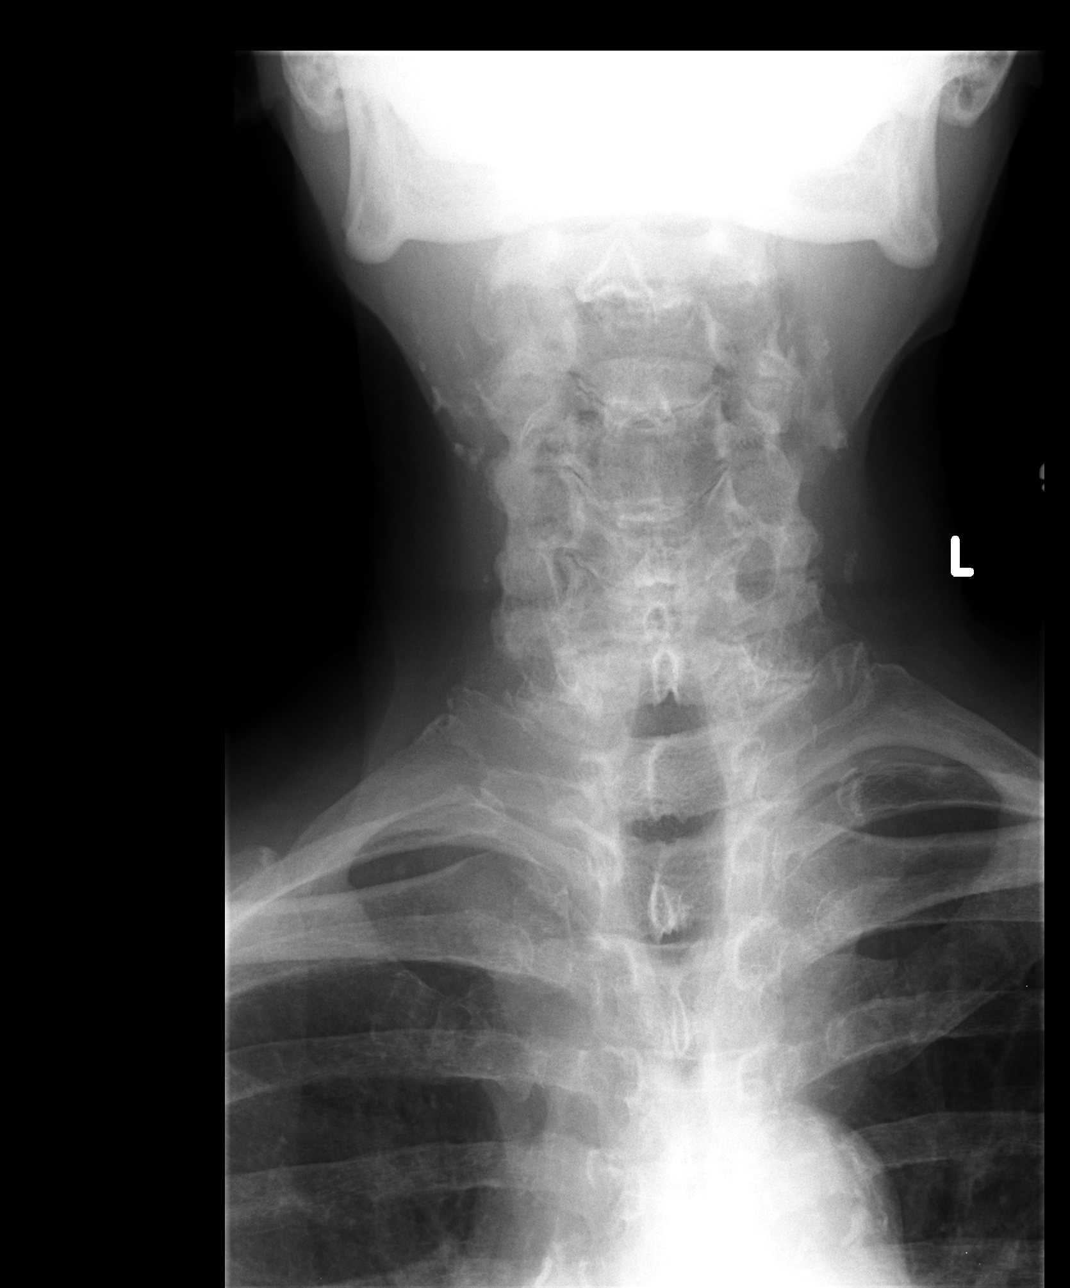

[view not recorded (2 of 4)]
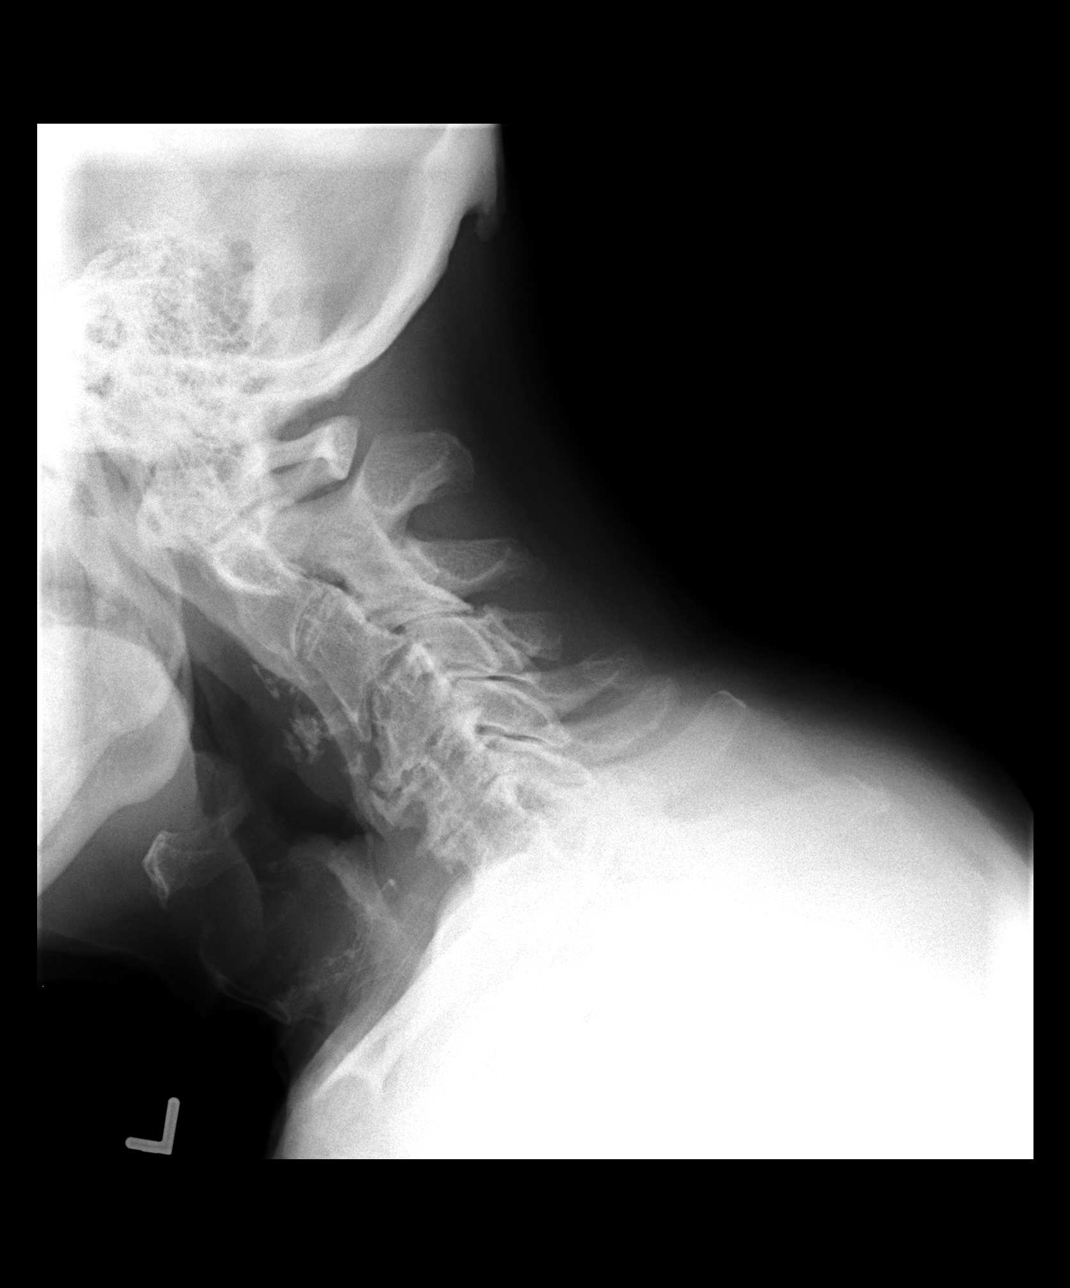

[view not recorded (3 of 4)]
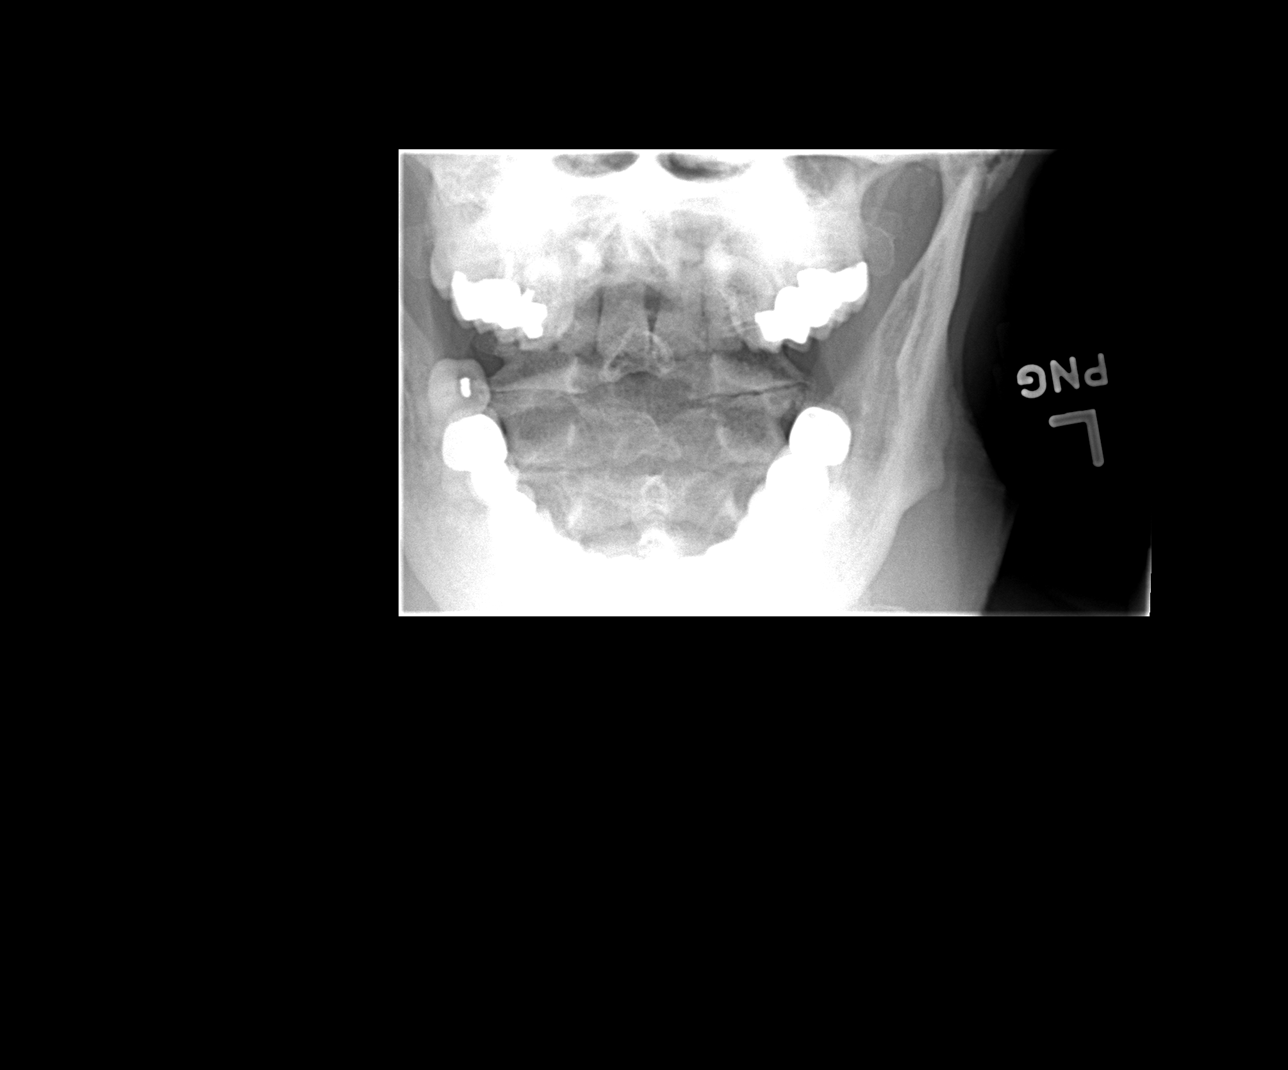

[view not recorded (4 of 4)]
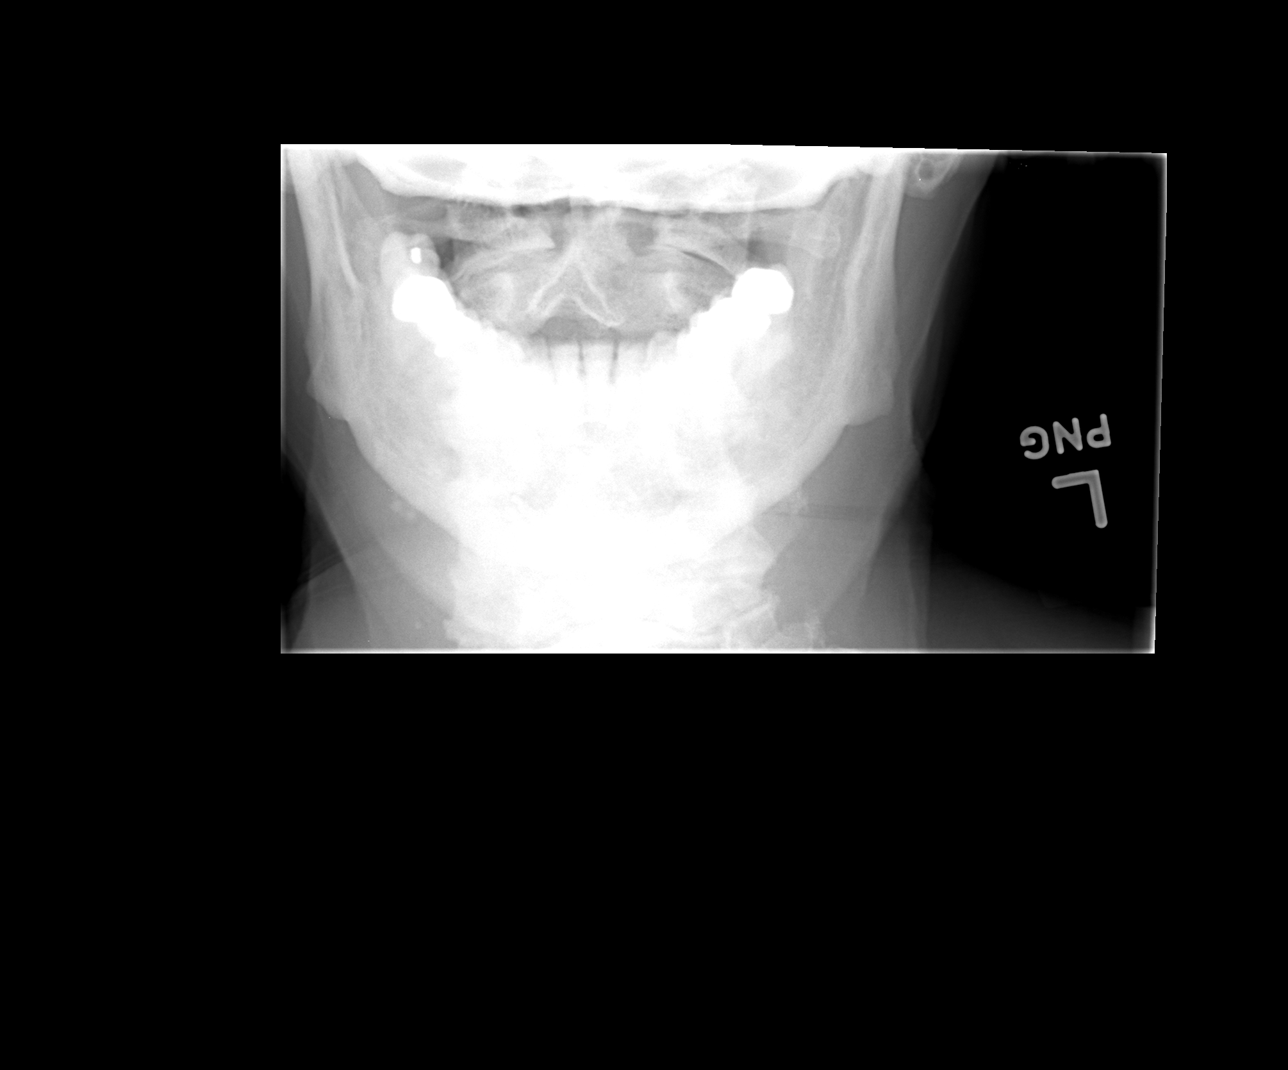

[4 of 4 positions shown; findings below may reference images not displayed]

FINDINGS: Chronic carotid bifurcation calcified plaque in the neck. Chronic
reversal of upper cervical lordosis. Chronic [facility] cervical spine
endplate degeneration with bulky endplate osteophytes at C4-C5 and
C5-C6. Prevertebral soft tissue contour remains within normal
limits. AP alignment and lung apices within normal limits. C1-C2
alignment within normal limits, chronic right C1-C2 joint space loss
with interval increased subchondral sclerosis. The cervicothoracic
junction is not visible.
IMPRESSION: Advanced degenerative changes in the cervical spine, stable or
mildly progressed since [DATE].

## 2016-02-04 DIAGNOSIS — I5030 Unspecified diastolic (congestive) heart failure: Secondary | ICD-10-CM | POA: Diagnosis not present

## 2016-02-04 DIAGNOSIS — E1159 Type 2 diabetes mellitus with other circulatory complications: Secondary | ICD-10-CM | POA: Diagnosis not present

## 2016-02-04 DIAGNOSIS — I25119 Atherosclerotic heart disease of native coronary artery with unspecified angina pectoris: Secondary | ICD-10-CM | POA: Diagnosis not present

## 2016-02-04 DIAGNOSIS — N189 Chronic kidney disease, unspecified: Secondary | ICD-10-CM | POA: Diagnosis not present

## 2016-02-04 DIAGNOSIS — I739 Peripheral vascular disease, unspecified: Secondary | ICD-10-CM | POA: Diagnosis not present

## 2016-02-04 DIAGNOSIS — I313 Pericardial effusion (noninflammatory): Secondary | ICD-10-CM | POA: Diagnosis not present

## 2016-02-06 DIAGNOSIS — I25119 Atherosclerotic heart disease of native coronary artery with unspecified angina pectoris: Secondary | ICD-10-CM | POA: Diagnosis not present

## 2016-02-06 DIAGNOSIS — I5030 Unspecified diastolic (congestive) heart failure: Secondary | ICD-10-CM | POA: Diagnosis not present

## 2016-02-06 DIAGNOSIS — I739 Peripheral vascular disease, unspecified: Secondary | ICD-10-CM | POA: Diagnosis not present

## 2016-02-06 DIAGNOSIS — N189 Chronic kidney disease, unspecified: Secondary | ICD-10-CM | POA: Diagnosis not present

## 2016-02-06 DIAGNOSIS — I313 Pericardial effusion (noninflammatory): Secondary | ICD-10-CM | POA: Diagnosis not present

## 2016-02-06 DIAGNOSIS — E1159 Type 2 diabetes mellitus with other circulatory complications: Secondary | ICD-10-CM | POA: Diagnosis not present

## 2016-02-07 DIAGNOSIS — I313 Pericardial effusion (noninflammatory): Secondary | ICD-10-CM | POA: Diagnosis not present

## 2016-02-07 DIAGNOSIS — E1159 Type 2 diabetes mellitus with other circulatory complications: Secondary | ICD-10-CM | POA: Diagnosis not present

## 2016-02-07 DIAGNOSIS — I739 Peripheral vascular disease, unspecified: Secondary | ICD-10-CM | POA: Diagnosis not present

## 2016-02-07 DIAGNOSIS — I25119 Atherosclerotic heart disease of native coronary artery with unspecified angina pectoris: Secondary | ICD-10-CM | POA: Diagnosis not present

## 2016-02-07 DIAGNOSIS — I5030 Unspecified diastolic (congestive) heart failure: Secondary | ICD-10-CM | POA: Diagnosis not present

## 2016-02-07 DIAGNOSIS — N189 Chronic kidney disease, unspecified: Secondary | ICD-10-CM | POA: Diagnosis not present

## 2016-02-10 DIAGNOSIS — J301 Allergic rhinitis due to pollen: Secondary | ICD-10-CM | POA: Diagnosis not present

## 2016-02-10 DIAGNOSIS — I5030 Unspecified diastolic (congestive) heart failure: Secondary | ICD-10-CM | POA: Diagnosis not present

## 2016-02-10 DIAGNOSIS — I739 Peripheral vascular disease, unspecified: Secondary | ICD-10-CM | POA: Diagnosis not present

## 2016-02-10 DIAGNOSIS — N401 Enlarged prostate with lower urinary tract symptoms: Secondary | ICD-10-CM | POA: Diagnosis not present

## 2016-02-10 DIAGNOSIS — I313 Pericardial effusion (noninflammatory): Secondary | ICD-10-CM | POA: Diagnosis not present

## 2016-02-10 DIAGNOSIS — I4891 Unspecified atrial fibrillation: Secondary | ICD-10-CM | POA: Diagnosis not present

## 2016-02-10 DIAGNOSIS — E785 Hyperlipidemia, unspecified: Secondary | ICD-10-CM | POA: Diagnosis not present

## 2016-02-10 DIAGNOSIS — I25119 Atherosclerotic heart disease of native coronary artery with unspecified angina pectoris: Secondary | ICD-10-CM | POA: Diagnosis not present

## 2016-02-10 DIAGNOSIS — E1159 Type 2 diabetes mellitus with other circulatory complications: Secondary | ICD-10-CM | POA: Diagnosis not present

## 2016-02-10 DIAGNOSIS — I10 Essential (primary) hypertension: Secondary | ICD-10-CM | POA: Diagnosis not present

## 2016-02-10 DIAGNOSIS — N189 Chronic kidney disease, unspecified: Secondary | ICD-10-CM | POA: Diagnosis not present

## 2016-02-10 DIAGNOSIS — M109 Gout, unspecified: Secondary | ICD-10-CM | POA: Diagnosis not present

## 2016-02-11 DIAGNOSIS — I25119 Atherosclerotic heart disease of native coronary artery with unspecified angina pectoris: Secondary | ICD-10-CM | POA: Diagnosis not present

## 2016-02-11 DIAGNOSIS — N189 Chronic kidney disease, unspecified: Secondary | ICD-10-CM | POA: Diagnosis not present

## 2016-02-11 DIAGNOSIS — E1159 Type 2 diabetes mellitus with other circulatory complications: Secondary | ICD-10-CM | POA: Diagnosis not present

## 2016-02-11 DIAGNOSIS — I5030 Unspecified diastolic (congestive) heart failure: Secondary | ICD-10-CM | POA: Diagnosis not present

## 2016-02-11 DIAGNOSIS — I313 Pericardial effusion (noninflammatory): Secondary | ICD-10-CM | POA: Diagnosis not present

## 2016-02-11 DIAGNOSIS — I739 Peripheral vascular disease, unspecified: Secondary | ICD-10-CM | POA: Diagnosis not present

## 2016-02-13 DIAGNOSIS — I25119 Atherosclerotic heart disease of native coronary artery with unspecified angina pectoris: Secondary | ICD-10-CM | POA: Diagnosis not present

## 2016-02-13 DIAGNOSIS — E1159 Type 2 diabetes mellitus with other circulatory complications: Secondary | ICD-10-CM | POA: Diagnosis not present

## 2016-02-13 DIAGNOSIS — N189 Chronic kidney disease, unspecified: Secondary | ICD-10-CM | POA: Diagnosis not present

## 2016-02-13 DIAGNOSIS — I313 Pericardial effusion (noninflammatory): Secondary | ICD-10-CM | POA: Diagnosis not present

## 2016-02-13 DIAGNOSIS — I739 Peripheral vascular disease, unspecified: Secondary | ICD-10-CM | POA: Diagnosis not present

## 2016-02-13 DIAGNOSIS — I5030 Unspecified diastolic (congestive) heart failure: Secondary | ICD-10-CM | POA: Diagnosis not present

## 2016-02-17 DIAGNOSIS — E1159 Type 2 diabetes mellitus with other circulatory complications: Secondary | ICD-10-CM | POA: Diagnosis not present

## 2016-02-17 DIAGNOSIS — N189 Chronic kidney disease, unspecified: Secondary | ICD-10-CM | POA: Diagnosis not present

## 2016-02-17 DIAGNOSIS — I5030 Unspecified diastolic (congestive) heart failure: Secondary | ICD-10-CM | POA: Diagnosis not present

## 2016-02-17 DIAGNOSIS — I313 Pericardial effusion (noninflammatory): Secondary | ICD-10-CM | POA: Diagnosis not present

## 2016-02-17 DIAGNOSIS — I739 Peripheral vascular disease, unspecified: Secondary | ICD-10-CM | POA: Diagnosis not present

## 2016-02-17 DIAGNOSIS — I25119 Atherosclerotic heart disease of native coronary artery with unspecified angina pectoris: Secondary | ICD-10-CM | POA: Diagnosis not present

## 2016-02-18 DIAGNOSIS — I739 Peripheral vascular disease, unspecified: Secondary | ICD-10-CM | POA: Diagnosis not present

## 2016-02-18 DIAGNOSIS — I313 Pericardial effusion (noninflammatory): Secondary | ICD-10-CM | POA: Diagnosis not present

## 2016-02-18 DIAGNOSIS — E1159 Type 2 diabetes mellitus with other circulatory complications: Secondary | ICD-10-CM | POA: Diagnosis not present

## 2016-02-18 DIAGNOSIS — I5030 Unspecified diastolic (congestive) heart failure: Secondary | ICD-10-CM | POA: Diagnosis not present

## 2016-02-18 DIAGNOSIS — N189 Chronic kidney disease, unspecified: Secondary | ICD-10-CM | POA: Diagnosis not present

## 2016-02-18 DIAGNOSIS — I25119 Atherosclerotic heart disease of native coronary artery with unspecified angina pectoris: Secondary | ICD-10-CM | POA: Diagnosis not present

## 2016-02-20 DIAGNOSIS — I313 Pericardial effusion (noninflammatory): Secondary | ICD-10-CM | POA: Diagnosis not present

## 2016-02-20 DIAGNOSIS — E1159 Type 2 diabetes mellitus with other circulatory complications: Secondary | ICD-10-CM | POA: Diagnosis not present

## 2016-02-20 DIAGNOSIS — I739 Peripheral vascular disease, unspecified: Secondary | ICD-10-CM | POA: Diagnosis not present

## 2016-02-20 DIAGNOSIS — I25119 Atherosclerotic heart disease of native coronary artery with unspecified angina pectoris: Secondary | ICD-10-CM | POA: Diagnosis not present

## 2016-02-20 DIAGNOSIS — I5030 Unspecified diastolic (congestive) heart failure: Secondary | ICD-10-CM | POA: Diagnosis not present

## 2016-02-20 DIAGNOSIS — N189 Chronic kidney disease, unspecified: Secondary | ICD-10-CM | POA: Diagnosis not present

## 2016-02-25 DIAGNOSIS — N189 Chronic kidney disease, unspecified: Secondary | ICD-10-CM | POA: Diagnosis not present

## 2016-02-25 DIAGNOSIS — E1159 Type 2 diabetes mellitus with other circulatory complications: Secondary | ICD-10-CM | POA: Diagnosis not present

## 2016-02-25 DIAGNOSIS — I313 Pericardial effusion (noninflammatory): Secondary | ICD-10-CM | POA: Diagnosis not present

## 2016-02-25 DIAGNOSIS — I25119 Atherosclerotic heart disease of native coronary artery with unspecified angina pectoris: Secondary | ICD-10-CM | POA: Diagnosis not present

## 2016-02-25 DIAGNOSIS — I5030 Unspecified diastolic (congestive) heart failure: Secondary | ICD-10-CM | POA: Diagnosis not present

## 2016-02-25 DIAGNOSIS — I739 Peripheral vascular disease, unspecified: Secondary | ICD-10-CM | POA: Diagnosis not present

## 2016-03-01 ENCOUNTER — Encounter: Payer: Self-pay | Admitting: Student

## 2016-03-03 DIAGNOSIS — E1159 Type 2 diabetes mellitus with other circulatory complications: Secondary | ICD-10-CM | POA: Diagnosis not present

## 2016-03-03 DIAGNOSIS — I5030 Unspecified diastolic (congestive) heart failure: Secondary | ICD-10-CM | POA: Diagnosis not present

## 2016-03-03 DIAGNOSIS — I313 Pericardial effusion (noninflammatory): Secondary | ICD-10-CM | POA: Diagnosis not present

## 2016-03-03 DIAGNOSIS — I739 Peripheral vascular disease, unspecified: Secondary | ICD-10-CM | POA: Diagnosis not present

## 2016-03-03 DIAGNOSIS — N189 Chronic kidney disease, unspecified: Secondary | ICD-10-CM | POA: Diagnosis not present

## 2016-03-03 DIAGNOSIS — I25119 Atherosclerotic heart disease of native coronary artery with unspecified angina pectoris: Secondary | ICD-10-CM | POA: Diagnosis not present

## 2016-03-05 DIAGNOSIS — I739 Peripheral vascular disease, unspecified: Secondary | ICD-10-CM | POA: Diagnosis not present

## 2016-03-05 DIAGNOSIS — N189 Chronic kidney disease, unspecified: Secondary | ICD-10-CM | POA: Diagnosis not present

## 2016-03-05 DIAGNOSIS — I313 Pericardial effusion (noninflammatory): Secondary | ICD-10-CM | POA: Diagnosis not present

## 2016-03-05 DIAGNOSIS — I5030 Unspecified diastolic (congestive) heart failure: Secondary | ICD-10-CM | POA: Diagnosis not present

## 2016-03-05 DIAGNOSIS — E1159 Type 2 diabetes mellitus with other circulatory complications: Secondary | ICD-10-CM | POA: Diagnosis not present

## 2016-03-05 DIAGNOSIS — I25119 Atherosclerotic heart disease of native coronary artery with unspecified angina pectoris: Secondary | ICD-10-CM | POA: Diagnosis not present

## 2016-03-06 DIAGNOSIS — I313 Pericardial effusion (noninflammatory): Secondary | ICD-10-CM | POA: Diagnosis not present

## 2016-03-06 DIAGNOSIS — E1159 Type 2 diabetes mellitus with other circulatory complications: Secondary | ICD-10-CM | POA: Diagnosis not present

## 2016-03-06 DIAGNOSIS — I739 Peripheral vascular disease, unspecified: Secondary | ICD-10-CM | POA: Diagnosis not present

## 2016-03-06 DIAGNOSIS — N189 Chronic kidney disease, unspecified: Secondary | ICD-10-CM | POA: Diagnosis not present

## 2016-03-06 DIAGNOSIS — I5030 Unspecified diastolic (congestive) heart failure: Secondary | ICD-10-CM | POA: Diagnosis not present

## 2016-03-06 DIAGNOSIS — I25119 Atherosclerotic heart disease of native coronary artery with unspecified angina pectoris: Secondary | ICD-10-CM | POA: Diagnosis not present

## 2016-03-10 DIAGNOSIS — N189 Chronic kidney disease, unspecified: Secondary | ICD-10-CM | POA: Diagnosis not present

## 2016-03-10 DIAGNOSIS — I313 Pericardial effusion (noninflammatory): Secondary | ICD-10-CM | POA: Diagnosis not present

## 2016-03-10 DIAGNOSIS — I25119 Atherosclerotic heart disease of native coronary artery with unspecified angina pectoris: Secondary | ICD-10-CM | POA: Diagnosis not present

## 2016-03-10 DIAGNOSIS — I739 Peripheral vascular disease, unspecified: Secondary | ICD-10-CM | POA: Diagnosis not present

## 2016-03-10 DIAGNOSIS — E1159 Type 2 diabetes mellitus with other circulatory complications: Secondary | ICD-10-CM | POA: Diagnosis not present

## 2016-03-10 DIAGNOSIS — I5030 Unspecified diastolic (congestive) heart failure: Secondary | ICD-10-CM | POA: Diagnosis not present

## 2016-03-12 DIAGNOSIS — I10 Essential (primary) hypertension: Secondary | ICD-10-CM | POA: Diagnosis not present

## 2016-03-12 DIAGNOSIS — I5030 Unspecified diastolic (congestive) heart failure: Secondary | ICD-10-CM | POA: Diagnosis not present

## 2016-03-12 DIAGNOSIS — N189 Chronic kidney disease, unspecified: Secondary | ICD-10-CM | POA: Diagnosis not present

## 2016-03-12 DIAGNOSIS — I313 Pericardial effusion (noninflammatory): Secondary | ICD-10-CM | POA: Diagnosis not present

## 2016-03-12 DIAGNOSIS — N401 Enlarged prostate with lower urinary tract symptoms: Secondary | ICD-10-CM | POA: Diagnosis not present

## 2016-03-12 DIAGNOSIS — E1159 Type 2 diabetes mellitus with other circulatory complications: Secondary | ICD-10-CM | POA: Diagnosis not present

## 2016-03-12 DIAGNOSIS — M109 Gout, unspecified: Secondary | ICD-10-CM | POA: Diagnosis not present

## 2016-03-12 DIAGNOSIS — I739 Peripheral vascular disease, unspecified: Secondary | ICD-10-CM | POA: Diagnosis not present

## 2016-03-12 DIAGNOSIS — E785 Hyperlipidemia, unspecified: Secondary | ICD-10-CM | POA: Diagnosis not present

## 2016-03-12 DIAGNOSIS — I4891 Unspecified atrial fibrillation: Secondary | ICD-10-CM | POA: Diagnosis not present

## 2016-03-12 DIAGNOSIS — I25119 Atherosclerotic heart disease of native coronary artery with unspecified angina pectoris: Secondary | ICD-10-CM | POA: Diagnosis not present

## 2016-03-12 DIAGNOSIS — J301 Allergic rhinitis due to pollen: Secondary | ICD-10-CM | POA: Diagnosis not present

## 2016-03-16 DIAGNOSIS — N189 Chronic kidney disease, unspecified: Secondary | ICD-10-CM | POA: Diagnosis not present

## 2016-03-16 DIAGNOSIS — E1159 Type 2 diabetes mellitus with other circulatory complications: Secondary | ICD-10-CM | POA: Diagnosis not present

## 2016-03-16 DIAGNOSIS — I739 Peripheral vascular disease, unspecified: Secondary | ICD-10-CM | POA: Diagnosis not present

## 2016-03-16 DIAGNOSIS — I5030 Unspecified diastolic (congestive) heart failure: Secondary | ICD-10-CM | POA: Diagnosis not present

## 2016-03-16 DIAGNOSIS — I25119 Atherosclerotic heart disease of native coronary artery with unspecified angina pectoris: Secondary | ICD-10-CM | POA: Diagnosis not present

## 2016-03-16 DIAGNOSIS — I313 Pericardial effusion (noninflammatory): Secondary | ICD-10-CM | POA: Diagnosis not present

## 2016-03-17 DIAGNOSIS — N189 Chronic kidney disease, unspecified: Secondary | ICD-10-CM | POA: Diagnosis not present

## 2016-03-17 DIAGNOSIS — I313 Pericardial effusion (noninflammatory): Secondary | ICD-10-CM | POA: Diagnosis not present

## 2016-03-17 DIAGNOSIS — I5030 Unspecified diastolic (congestive) heart failure: Secondary | ICD-10-CM | POA: Diagnosis not present

## 2016-03-17 DIAGNOSIS — I25119 Atherosclerotic heart disease of native coronary artery with unspecified angina pectoris: Secondary | ICD-10-CM | POA: Diagnosis not present

## 2016-03-17 DIAGNOSIS — I739 Peripheral vascular disease, unspecified: Secondary | ICD-10-CM | POA: Diagnosis not present

## 2016-03-17 DIAGNOSIS — E1159 Type 2 diabetes mellitus with other circulatory complications: Secondary | ICD-10-CM | POA: Diagnosis not present

## 2016-03-19 DIAGNOSIS — I739 Peripheral vascular disease, unspecified: Secondary | ICD-10-CM | POA: Diagnosis not present

## 2016-03-19 DIAGNOSIS — I25119 Atherosclerotic heart disease of native coronary artery with unspecified angina pectoris: Secondary | ICD-10-CM | POA: Diagnosis not present

## 2016-03-19 DIAGNOSIS — I313 Pericardial effusion (noninflammatory): Secondary | ICD-10-CM | POA: Diagnosis not present

## 2016-03-19 DIAGNOSIS — N189 Chronic kidney disease, unspecified: Secondary | ICD-10-CM | POA: Diagnosis not present

## 2016-03-19 DIAGNOSIS — E1159 Type 2 diabetes mellitus with other circulatory complications: Secondary | ICD-10-CM | POA: Diagnosis not present

## 2016-03-19 DIAGNOSIS — I5030 Unspecified diastolic (congestive) heart failure: Secondary | ICD-10-CM | POA: Diagnosis not present

## 2016-03-24 DIAGNOSIS — I5030 Unspecified diastolic (congestive) heart failure: Secondary | ICD-10-CM | POA: Diagnosis not present

## 2016-03-24 DIAGNOSIS — I25119 Atherosclerotic heart disease of native coronary artery with unspecified angina pectoris: Secondary | ICD-10-CM | POA: Diagnosis not present

## 2016-03-24 DIAGNOSIS — E1159 Type 2 diabetes mellitus with other circulatory complications: Secondary | ICD-10-CM | POA: Diagnosis not present

## 2016-03-24 DIAGNOSIS — I739 Peripheral vascular disease, unspecified: Secondary | ICD-10-CM | POA: Diagnosis not present

## 2016-03-24 DIAGNOSIS — I313 Pericardial effusion (noninflammatory): Secondary | ICD-10-CM | POA: Diagnosis not present

## 2016-03-24 DIAGNOSIS — N189 Chronic kidney disease, unspecified: Secondary | ICD-10-CM | POA: Diagnosis not present

## 2016-03-26 DIAGNOSIS — I25119 Atherosclerotic heart disease of native coronary artery with unspecified angina pectoris: Secondary | ICD-10-CM | POA: Diagnosis not present

## 2016-03-26 DIAGNOSIS — E1159 Type 2 diabetes mellitus with other circulatory complications: Secondary | ICD-10-CM | POA: Diagnosis not present

## 2016-03-26 DIAGNOSIS — I5030 Unspecified diastolic (congestive) heart failure: Secondary | ICD-10-CM | POA: Diagnosis not present

## 2016-03-26 DIAGNOSIS — I739 Peripheral vascular disease, unspecified: Secondary | ICD-10-CM | POA: Diagnosis not present

## 2016-03-26 DIAGNOSIS — I313 Pericardial effusion (noninflammatory): Secondary | ICD-10-CM | POA: Diagnosis not present

## 2016-03-26 DIAGNOSIS — N189 Chronic kidney disease, unspecified: Secondary | ICD-10-CM | POA: Diagnosis not present

## 2016-03-31 DIAGNOSIS — I25119 Atherosclerotic heart disease of native coronary artery with unspecified angina pectoris: Secondary | ICD-10-CM | POA: Diagnosis not present

## 2016-03-31 DIAGNOSIS — I739 Peripheral vascular disease, unspecified: Secondary | ICD-10-CM | POA: Diagnosis not present

## 2016-03-31 DIAGNOSIS — E1159 Type 2 diabetes mellitus with other circulatory complications: Secondary | ICD-10-CM | POA: Diagnosis not present

## 2016-03-31 DIAGNOSIS — I5030 Unspecified diastolic (congestive) heart failure: Secondary | ICD-10-CM | POA: Diagnosis not present

## 2016-03-31 DIAGNOSIS — I313 Pericardial effusion (noninflammatory): Secondary | ICD-10-CM | POA: Diagnosis not present

## 2016-03-31 DIAGNOSIS — N189 Chronic kidney disease, unspecified: Secondary | ICD-10-CM | POA: Diagnosis not present

## 2016-04-02 DIAGNOSIS — I25119 Atherosclerotic heart disease of native coronary artery with unspecified angina pectoris: Secondary | ICD-10-CM | POA: Diagnosis not present

## 2016-04-02 DIAGNOSIS — E1159 Type 2 diabetes mellitus with other circulatory complications: Secondary | ICD-10-CM | POA: Diagnosis not present

## 2016-04-02 DIAGNOSIS — N189 Chronic kidney disease, unspecified: Secondary | ICD-10-CM | POA: Diagnosis not present

## 2016-04-02 DIAGNOSIS — I313 Pericardial effusion (noninflammatory): Secondary | ICD-10-CM | POA: Diagnosis not present

## 2016-04-02 DIAGNOSIS — I5030 Unspecified diastolic (congestive) heart failure: Secondary | ICD-10-CM | POA: Diagnosis not present

## 2016-04-02 DIAGNOSIS — I739 Peripheral vascular disease, unspecified: Secondary | ICD-10-CM | POA: Diagnosis not present

## 2016-04-03 DIAGNOSIS — I313 Pericardial effusion (noninflammatory): Secondary | ICD-10-CM | POA: Diagnosis not present

## 2016-04-03 DIAGNOSIS — E1159 Type 2 diabetes mellitus with other circulatory complications: Secondary | ICD-10-CM | POA: Diagnosis not present

## 2016-04-03 DIAGNOSIS — I739 Peripheral vascular disease, unspecified: Secondary | ICD-10-CM | POA: Diagnosis not present

## 2016-04-03 DIAGNOSIS — I5030 Unspecified diastolic (congestive) heart failure: Secondary | ICD-10-CM | POA: Diagnosis not present

## 2016-04-03 DIAGNOSIS — N189 Chronic kidney disease, unspecified: Secondary | ICD-10-CM | POA: Diagnosis not present

## 2016-04-03 DIAGNOSIS — I25119 Atherosclerotic heart disease of native coronary artery with unspecified angina pectoris: Secondary | ICD-10-CM | POA: Diagnosis not present

## 2016-04-07 DIAGNOSIS — I5030 Unspecified diastolic (congestive) heart failure: Secondary | ICD-10-CM | POA: Diagnosis not present

## 2016-04-07 DIAGNOSIS — I25119 Atherosclerotic heart disease of native coronary artery with unspecified angina pectoris: Secondary | ICD-10-CM | POA: Diagnosis not present

## 2016-04-07 DIAGNOSIS — I739 Peripheral vascular disease, unspecified: Secondary | ICD-10-CM | POA: Diagnosis not present

## 2016-04-07 DIAGNOSIS — N189 Chronic kidney disease, unspecified: Secondary | ICD-10-CM | POA: Diagnosis not present

## 2016-04-07 DIAGNOSIS — E1159 Type 2 diabetes mellitus with other circulatory complications: Secondary | ICD-10-CM | POA: Diagnosis not present

## 2016-04-07 DIAGNOSIS — I313 Pericardial effusion (noninflammatory): Secondary | ICD-10-CM | POA: Diagnosis not present

## 2016-04-09 DIAGNOSIS — J301 Allergic rhinitis due to pollen: Secondary | ICD-10-CM | POA: Diagnosis not present

## 2016-04-09 DIAGNOSIS — I25119 Atherosclerotic heart disease of native coronary artery with unspecified angina pectoris: Secondary | ICD-10-CM | POA: Diagnosis not present

## 2016-04-09 DIAGNOSIS — I10 Essential (primary) hypertension: Secondary | ICD-10-CM | POA: Diagnosis not present

## 2016-04-09 DIAGNOSIS — E1159 Type 2 diabetes mellitus with other circulatory complications: Secondary | ICD-10-CM | POA: Diagnosis not present

## 2016-04-09 DIAGNOSIS — I5030 Unspecified diastolic (congestive) heart failure: Secondary | ICD-10-CM | POA: Diagnosis not present

## 2016-04-09 DIAGNOSIS — I4891 Unspecified atrial fibrillation: Secondary | ICD-10-CM | POA: Diagnosis not present

## 2016-04-09 DIAGNOSIS — N401 Enlarged prostate with lower urinary tract symptoms: Secondary | ICD-10-CM | POA: Diagnosis not present

## 2016-04-09 DIAGNOSIS — N189 Chronic kidney disease, unspecified: Secondary | ICD-10-CM | POA: Diagnosis not present

## 2016-04-09 DIAGNOSIS — E785 Hyperlipidemia, unspecified: Secondary | ICD-10-CM | POA: Diagnosis not present

## 2016-04-09 DIAGNOSIS — I739 Peripheral vascular disease, unspecified: Secondary | ICD-10-CM | POA: Diagnosis not present

## 2016-04-09 DIAGNOSIS — I313 Pericardial effusion (noninflammatory): Secondary | ICD-10-CM | POA: Diagnosis not present

## 2016-04-09 DIAGNOSIS — M109 Gout, unspecified: Secondary | ICD-10-CM | POA: Diagnosis not present

## 2016-04-13 ENCOUNTER — Other Ambulatory Visit: Payer: Self-pay | Admitting: Internal Medicine

## 2016-04-13 DIAGNOSIS — E1159 Type 2 diabetes mellitus with other circulatory complications: Secondary | ICD-10-CM | POA: Diagnosis not present

## 2016-04-13 DIAGNOSIS — I739 Peripheral vascular disease, unspecified: Secondary | ICD-10-CM | POA: Diagnosis not present

## 2016-04-13 DIAGNOSIS — I313 Pericardial effusion (noninflammatory): Secondary | ICD-10-CM | POA: Diagnosis not present

## 2016-04-13 DIAGNOSIS — I25119 Atherosclerotic heart disease of native coronary artery with unspecified angina pectoris: Secondary | ICD-10-CM | POA: Diagnosis not present

## 2016-04-13 DIAGNOSIS — I5030 Unspecified diastolic (congestive) heart failure: Secondary | ICD-10-CM | POA: Diagnosis not present

## 2016-04-13 DIAGNOSIS — N189 Chronic kidney disease, unspecified: Secondary | ICD-10-CM | POA: Diagnosis not present

## 2016-04-14 DIAGNOSIS — I739 Peripheral vascular disease, unspecified: Secondary | ICD-10-CM | POA: Diagnosis not present

## 2016-04-14 DIAGNOSIS — I25119 Atherosclerotic heart disease of native coronary artery with unspecified angina pectoris: Secondary | ICD-10-CM | POA: Diagnosis not present

## 2016-04-14 DIAGNOSIS — E1159 Type 2 diabetes mellitus with other circulatory complications: Secondary | ICD-10-CM | POA: Diagnosis not present

## 2016-04-14 DIAGNOSIS — I5030 Unspecified diastolic (congestive) heart failure: Secondary | ICD-10-CM | POA: Diagnosis not present

## 2016-04-14 DIAGNOSIS — N189 Chronic kidney disease, unspecified: Secondary | ICD-10-CM | POA: Diagnosis not present

## 2016-04-14 DIAGNOSIS — I313 Pericardial effusion (noninflammatory): Secondary | ICD-10-CM | POA: Diagnosis not present

## 2016-04-15 DIAGNOSIS — I313 Pericardial effusion (noninflammatory): Secondary | ICD-10-CM | POA: Diagnosis not present

## 2016-04-15 DIAGNOSIS — E1159 Type 2 diabetes mellitus with other circulatory complications: Secondary | ICD-10-CM | POA: Diagnosis not present

## 2016-04-15 DIAGNOSIS — I5030 Unspecified diastolic (congestive) heart failure: Secondary | ICD-10-CM | POA: Diagnosis not present

## 2016-04-15 DIAGNOSIS — I739 Peripheral vascular disease, unspecified: Secondary | ICD-10-CM | POA: Diagnosis not present

## 2016-04-15 DIAGNOSIS — I25119 Atherosclerotic heart disease of native coronary artery with unspecified angina pectoris: Secondary | ICD-10-CM | POA: Diagnosis not present

## 2016-04-15 DIAGNOSIS — N189 Chronic kidney disease, unspecified: Secondary | ICD-10-CM | POA: Diagnosis not present

## 2016-04-16 DIAGNOSIS — I313 Pericardial effusion (noninflammatory): Secondary | ICD-10-CM | POA: Diagnosis not present

## 2016-04-16 DIAGNOSIS — I5030 Unspecified diastolic (congestive) heart failure: Secondary | ICD-10-CM | POA: Diagnosis not present

## 2016-04-16 DIAGNOSIS — N189 Chronic kidney disease, unspecified: Secondary | ICD-10-CM | POA: Diagnosis not present

## 2016-04-16 DIAGNOSIS — I25119 Atherosclerotic heart disease of native coronary artery with unspecified angina pectoris: Secondary | ICD-10-CM | POA: Diagnosis not present

## 2016-04-16 DIAGNOSIS — I739 Peripheral vascular disease, unspecified: Secondary | ICD-10-CM | POA: Diagnosis not present

## 2016-04-16 DIAGNOSIS — E1159 Type 2 diabetes mellitus with other circulatory complications: Secondary | ICD-10-CM | POA: Diagnosis not present

## 2016-04-21 DIAGNOSIS — E1159 Type 2 diabetes mellitus with other circulatory complications: Secondary | ICD-10-CM | POA: Diagnosis not present

## 2016-04-21 DIAGNOSIS — N189 Chronic kidney disease, unspecified: Secondary | ICD-10-CM | POA: Diagnosis not present

## 2016-04-21 DIAGNOSIS — I25119 Atherosclerotic heart disease of native coronary artery with unspecified angina pectoris: Secondary | ICD-10-CM | POA: Diagnosis not present

## 2016-04-21 DIAGNOSIS — I739 Peripheral vascular disease, unspecified: Secondary | ICD-10-CM | POA: Diagnosis not present

## 2016-04-21 DIAGNOSIS — I5030 Unspecified diastolic (congestive) heart failure: Secondary | ICD-10-CM | POA: Diagnosis not present

## 2016-04-21 DIAGNOSIS — I313 Pericardial effusion (noninflammatory): Secondary | ICD-10-CM | POA: Diagnosis not present

## 2016-04-23 DIAGNOSIS — I5030 Unspecified diastolic (congestive) heart failure: Secondary | ICD-10-CM | POA: Diagnosis not present

## 2016-04-23 DIAGNOSIS — I739 Peripheral vascular disease, unspecified: Secondary | ICD-10-CM | POA: Diagnosis not present

## 2016-04-23 DIAGNOSIS — E1159 Type 2 diabetes mellitus with other circulatory complications: Secondary | ICD-10-CM | POA: Diagnosis not present

## 2016-04-23 DIAGNOSIS — N189 Chronic kidney disease, unspecified: Secondary | ICD-10-CM | POA: Diagnosis not present

## 2016-04-23 DIAGNOSIS — I25119 Atherosclerotic heart disease of native coronary artery with unspecified angina pectoris: Secondary | ICD-10-CM | POA: Diagnosis not present

## 2016-04-23 DIAGNOSIS — I313 Pericardial effusion (noninflammatory): Secondary | ICD-10-CM | POA: Diagnosis not present

## 2016-04-28 DIAGNOSIS — E1159 Type 2 diabetes mellitus with other circulatory complications: Secondary | ICD-10-CM | POA: Diagnosis not present

## 2016-04-28 DIAGNOSIS — I313 Pericardial effusion (noninflammatory): Secondary | ICD-10-CM | POA: Diagnosis not present

## 2016-04-28 DIAGNOSIS — I25119 Atherosclerotic heart disease of native coronary artery with unspecified angina pectoris: Secondary | ICD-10-CM | POA: Diagnosis not present

## 2016-04-28 DIAGNOSIS — I5030 Unspecified diastolic (congestive) heart failure: Secondary | ICD-10-CM | POA: Diagnosis not present

## 2016-04-28 DIAGNOSIS — N189 Chronic kidney disease, unspecified: Secondary | ICD-10-CM | POA: Diagnosis not present

## 2016-04-28 DIAGNOSIS — I739 Peripheral vascular disease, unspecified: Secondary | ICD-10-CM | POA: Diagnosis not present

## 2016-04-30 DIAGNOSIS — I25119 Atherosclerotic heart disease of native coronary artery with unspecified angina pectoris: Secondary | ICD-10-CM | POA: Diagnosis not present

## 2016-04-30 DIAGNOSIS — I5030 Unspecified diastolic (congestive) heart failure: Secondary | ICD-10-CM | POA: Diagnosis not present

## 2016-04-30 DIAGNOSIS — E1159 Type 2 diabetes mellitus with other circulatory complications: Secondary | ICD-10-CM | POA: Diagnosis not present

## 2016-04-30 DIAGNOSIS — I313 Pericardial effusion (noninflammatory): Secondary | ICD-10-CM | POA: Diagnosis not present

## 2016-04-30 DIAGNOSIS — I739 Peripheral vascular disease, unspecified: Secondary | ICD-10-CM | POA: Diagnosis not present

## 2016-04-30 DIAGNOSIS — N189 Chronic kidney disease, unspecified: Secondary | ICD-10-CM | POA: Diagnosis not present

## 2016-05-01 DIAGNOSIS — I5030 Unspecified diastolic (congestive) heart failure: Secondary | ICD-10-CM | POA: Diagnosis not present

## 2016-05-01 DIAGNOSIS — I313 Pericardial effusion (noninflammatory): Secondary | ICD-10-CM | POA: Diagnosis not present

## 2016-05-01 DIAGNOSIS — I739 Peripheral vascular disease, unspecified: Secondary | ICD-10-CM | POA: Diagnosis not present

## 2016-05-01 DIAGNOSIS — E1159 Type 2 diabetes mellitus with other circulatory complications: Secondary | ICD-10-CM | POA: Diagnosis not present

## 2016-05-01 DIAGNOSIS — N189 Chronic kidney disease, unspecified: Secondary | ICD-10-CM | POA: Diagnosis not present

## 2016-05-01 DIAGNOSIS — I25119 Atherosclerotic heart disease of native coronary artery with unspecified angina pectoris: Secondary | ICD-10-CM | POA: Diagnosis not present

## 2016-05-05 DIAGNOSIS — I25119 Atherosclerotic heart disease of native coronary artery with unspecified angina pectoris: Secondary | ICD-10-CM | POA: Diagnosis not present

## 2016-05-05 DIAGNOSIS — E1159 Type 2 diabetes mellitus with other circulatory complications: Secondary | ICD-10-CM | POA: Diagnosis not present

## 2016-05-05 DIAGNOSIS — I313 Pericardial effusion (noninflammatory): Secondary | ICD-10-CM | POA: Diagnosis not present

## 2016-05-05 DIAGNOSIS — I739 Peripheral vascular disease, unspecified: Secondary | ICD-10-CM | POA: Diagnosis not present

## 2016-05-05 DIAGNOSIS — I5030 Unspecified diastolic (congestive) heart failure: Secondary | ICD-10-CM | POA: Diagnosis not present

## 2016-05-05 DIAGNOSIS — N189 Chronic kidney disease, unspecified: Secondary | ICD-10-CM | POA: Diagnosis not present

## 2016-05-07 DIAGNOSIS — E1159 Type 2 diabetes mellitus with other circulatory complications: Secondary | ICD-10-CM | POA: Diagnosis not present

## 2016-05-07 DIAGNOSIS — I313 Pericardial effusion (noninflammatory): Secondary | ICD-10-CM | POA: Diagnosis not present

## 2016-05-07 DIAGNOSIS — I739 Peripheral vascular disease, unspecified: Secondary | ICD-10-CM | POA: Diagnosis not present

## 2016-05-07 DIAGNOSIS — N189 Chronic kidney disease, unspecified: Secondary | ICD-10-CM | POA: Diagnosis not present

## 2016-05-07 DIAGNOSIS — I25119 Atherosclerotic heart disease of native coronary artery with unspecified angina pectoris: Secondary | ICD-10-CM | POA: Diagnosis not present

## 2016-05-07 DIAGNOSIS — I5030 Unspecified diastolic (congestive) heart failure: Secondary | ICD-10-CM | POA: Diagnosis not present

## 2016-05-09 DIAGNOSIS — N189 Chronic kidney disease, unspecified: Secondary | ICD-10-CM | POA: Diagnosis not present

## 2016-05-09 DIAGNOSIS — I25119 Atherosclerotic heart disease of native coronary artery with unspecified angina pectoris: Secondary | ICD-10-CM | POA: Diagnosis not present

## 2016-05-09 DIAGNOSIS — I739 Peripheral vascular disease, unspecified: Secondary | ICD-10-CM | POA: Diagnosis not present

## 2016-05-09 DIAGNOSIS — E1159 Type 2 diabetes mellitus with other circulatory complications: Secondary | ICD-10-CM | POA: Diagnosis not present

## 2016-05-09 DIAGNOSIS — I313 Pericardial effusion (noninflammatory): Secondary | ICD-10-CM | POA: Diagnosis not present

## 2016-05-09 DIAGNOSIS — I5030 Unspecified diastolic (congestive) heart failure: Secondary | ICD-10-CM | POA: Diagnosis not present

## 2016-05-10 DIAGNOSIS — N401 Enlarged prostate with lower urinary tract symptoms: Secondary | ICD-10-CM | POA: Diagnosis not present

## 2016-05-10 DIAGNOSIS — N189 Chronic kidney disease, unspecified: Secondary | ICD-10-CM | POA: Diagnosis not present

## 2016-05-10 DIAGNOSIS — J301 Allergic rhinitis due to pollen: Secondary | ICD-10-CM | POA: Diagnosis not present

## 2016-05-10 DIAGNOSIS — I5030 Unspecified diastolic (congestive) heart failure: Secondary | ICD-10-CM | POA: Diagnosis not present

## 2016-05-10 DIAGNOSIS — I10 Essential (primary) hypertension: Secondary | ICD-10-CM | POA: Diagnosis not present

## 2016-05-10 DIAGNOSIS — I25119 Atherosclerotic heart disease of native coronary artery with unspecified angina pectoris: Secondary | ICD-10-CM | POA: Diagnosis not present

## 2016-05-10 DIAGNOSIS — I4891 Unspecified atrial fibrillation: Secondary | ICD-10-CM | POA: Diagnosis not present

## 2016-05-10 DIAGNOSIS — M109 Gout, unspecified: Secondary | ICD-10-CM | POA: Diagnosis not present

## 2016-05-10 DIAGNOSIS — E1159 Type 2 diabetes mellitus with other circulatory complications: Secondary | ICD-10-CM | POA: Diagnosis not present

## 2016-05-10 DIAGNOSIS — I313 Pericardial effusion (noninflammatory): Secondary | ICD-10-CM | POA: Diagnosis not present

## 2016-05-10 DIAGNOSIS — E785 Hyperlipidemia, unspecified: Secondary | ICD-10-CM | POA: Diagnosis not present

## 2016-05-10 DIAGNOSIS — I739 Peripheral vascular disease, unspecified: Secondary | ICD-10-CM | POA: Diagnosis not present

## 2016-05-11 DIAGNOSIS — I5030 Unspecified diastolic (congestive) heart failure: Secondary | ICD-10-CM | POA: Diagnosis not present

## 2016-05-11 DIAGNOSIS — I25119 Atherosclerotic heart disease of native coronary artery with unspecified angina pectoris: Secondary | ICD-10-CM | POA: Diagnosis not present

## 2016-05-11 DIAGNOSIS — I313 Pericardial effusion (noninflammatory): Secondary | ICD-10-CM | POA: Diagnosis not present

## 2016-05-11 DIAGNOSIS — N189 Chronic kidney disease, unspecified: Secondary | ICD-10-CM | POA: Diagnosis not present

## 2016-05-11 DIAGNOSIS — E1159 Type 2 diabetes mellitus with other circulatory complications: Secondary | ICD-10-CM | POA: Diagnosis not present

## 2016-05-11 DIAGNOSIS — I739 Peripheral vascular disease, unspecified: Secondary | ICD-10-CM | POA: Diagnosis not present

## 2016-05-12 DIAGNOSIS — I739 Peripheral vascular disease, unspecified: Secondary | ICD-10-CM | POA: Diagnosis not present

## 2016-05-12 DIAGNOSIS — E1159 Type 2 diabetes mellitus with other circulatory complications: Secondary | ICD-10-CM | POA: Diagnosis not present

## 2016-05-12 DIAGNOSIS — I25119 Atherosclerotic heart disease of native coronary artery with unspecified angina pectoris: Secondary | ICD-10-CM | POA: Diagnosis not present

## 2016-05-12 DIAGNOSIS — I5030 Unspecified diastolic (congestive) heart failure: Secondary | ICD-10-CM | POA: Diagnosis not present

## 2016-05-12 DIAGNOSIS — I313 Pericardial effusion (noninflammatory): Secondary | ICD-10-CM | POA: Diagnosis not present

## 2016-05-12 DIAGNOSIS — N189 Chronic kidney disease, unspecified: Secondary | ICD-10-CM | POA: Diagnosis not present

## 2016-05-13 DIAGNOSIS — N189 Chronic kidney disease, unspecified: Secondary | ICD-10-CM | POA: Diagnosis not present

## 2016-05-13 DIAGNOSIS — E1159 Type 2 diabetes mellitus with other circulatory complications: Secondary | ICD-10-CM | POA: Diagnosis not present

## 2016-05-13 DIAGNOSIS — I25119 Atherosclerotic heart disease of native coronary artery with unspecified angina pectoris: Secondary | ICD-10-CM | POA: Diagnosis not present

## 2016-05-13 DIAGNOSIS — I5030 Unspecified diastolic (congestive) heart failure: Secondary | ICD-10-CM | POA: Diagnosis not present

## 2016-05-13 DIAGNOSIS — I313 Pericardial effusion (noninflammatory): Secondary | ICD-10-CM | POA: Diagnosis not present

## 2016-05-13 DIAGNOSIS — I739 Peripheral vascular disease, unspecified: Secondary | ICD-10-CM | POA: Diagnosis not present

## 2016-05-14 DIAGNOSIS — I25119 Atherosclerotic heart disease of native coronary artery with unspecified angina pectoris: Secondary | ICD-10-CM | POA: Diagnosis not present

## 2016-05-14 DIAGNOSIS — I739 Peripheral vascular disease, unspecified: Secondary | ICD-10-CM | POA: Diagnosis not present

## 2016-05-14 DIAGNOSIS — N189 Chronic kidney disease, unspecified: Secondary | ICD-10-CM | POA: Diagnosis not present

## 2016-05-14 DIAGNOSIS — I5030 Unspecified diastolic (congestive) heart failure: Secondary | ICD-10-CM | POA: Diagnosis not present

## 2016-05-14 DIAGNOSIS — E1159 Type 2 diabetes mellitus with other circulatory complications: Secondary | ICD-10-CM | POA: Diagnosis not present

## 2016-05-14 DIAGNOSIS — I313 Pericardial effusion (noninflammatory): Secondary | ICD-10-CM | POA: Diagnosis not present

## 2016-05-19 DIAGNOSIS — I25119 Atherosclerotic heart disease of native coronary artery with unspecified angina pectoris: Secondary | ICD-10-CM | POA: Diagnosis not present

## 2016-05-19 DIAGNOSIS — I313 Pericardial effusion (noninflammatory): Secondary | ICD-10-CM | POA: Diagnosis not present

## 2016-05-19 DIAGNOSIS — E1159 Type 2 diabetes mellitus with other circulatory complications: Secondary | ICD-10-CM | POA: Diagnosis not present

## 2016-05-19 DIAGNOSIS — I739 Peripheral vascular disease, unspecified: Secondary | ICD-10-CM | POA: Diagnosis not present

## 2016-05-19 DIAGNOSIS — I5030 Unspecified diastolic (congestive) heart failure: Secondary | ICD-10-CM | POA: Diagnosis not present

## 2016-05-19 DIAGNOSIS — N189 Chronic kidney disease, unspecified: Secondary | ICD-10-CM | POA: Diagnosis not present

## 2016-05-21 DIAGNOSIS — E1159 Type 2 diabetes mellitus with other circulatory complications: Secondary | ICD-10-CM | POA: Diagnosis not present

## 2016-05-21 DIAGNOSIS — I739 Peripheral vascular disease, unspecified: Secondary | ICD-10-CM | POA: Diagnosis not present

## 2016-05-21 DIAGNOSIS — I313 Pericardial effusion (noninflammatory): Secondary | ICD-10-CM | POA: Diagnosis not present

## 2016-05-21 DIAGNOSIS — N189 Chronic kidney disease, unspecified: Secondary | ICD-10-CM | POA: Diagnosis not present

## 2016-05-21 DIAGNOSIS — I25119 Atherosclerotic heart disease of native coronary artery with unspecified angina pectoris: Secondary | ICD-10-CM | POA: Diagnosis not present

## 2016-05-21 DIAGNOSIS — I5030 Unspecified diastolic (congestive) heart failure: Secondary | ICD-10-CM | POA: Diagnosis not present

## 2016-05-22 DIAGNOSIS — I5030 Unspecified diastolic (congestive) heart failure: Secondary | ICD-10-CM | POA: Diagnosis not present

## 2016-05-22 DIAGNOSIS — I739 Peripheral vascular disease, unspecified: Secondary | ICD-10-CM | POA: Diagnosis not present

## 2016-05-22 DIAGNOSIS — E1159 Type 2 diabetes mellitus with other circulatory complications: Secondary | ICD-10-CM | POA: Diagnosis not present

## 2016-05-22 DIAGNOSIS — I313 Pericardial effusion (noninflammatory): Secondary | ICD-10-CM | POA: Diagnosis not present

## 2016-05-22 DIAGNOSIS — N189 Chronic kidney disease, unspecified: Secondary | ICD-10-CM | POA: Diagnosis not present

## 2016-05-22 DIAGNOSIS — I25119 Atherosclerotic heart disease of native coronary artery with unspecified angina pectoris: Secondary | ICD-10-CM | POA: Diagnosis not present

## 2016-05-26 DIAGNOSIS — I5030 Unspecified diastolic (congestive) heart failure: Secondary | ICD-10-CM | POA: Diagnosis not present

## 2016-05-26 DIAGNOSIS — I739 Peripheral vascular disease, unspecified: Secondary | ICD-10-CM | POA: Diagnosis not present

## 2016-05-26 DIAGNOSIS — E1159 Type 2 diabetes mellitus with other circulatory complications: Secondary | ICD-10-CM | POA: Diagnosis not present

## 2016-05-26 DIAGNOSIS — I25119 Atherosclerotic heart disease of native coronary artery with unspecified angina pectoris: Secondary | ICD-10-CM | POA: Diagnosis not present

## 2016-05-26 DIAGNOSIS — N189 Chronic kidney disease, unspecified: Secondary | ICD-10-CM | POA: Diagnosis not present

## 2016-05-26 DIAGNOSIS — I313 Pericardial effusion (noninflammatory): Secondary | ICD-10-CM | POA: Diagnosis not present

## 2016-05-27 DIAGNOSIS — I25119 Atherosclerotic heart disease of native coronary artery with unspecified angina pectoris: Secondary | ICD-10-CM | POA: Diagnosis not present

## 2016-05-27 DIAGNOSIS — I5030 Unspecified diastolic (congestive) heart failure: Secondary | ICD-10-CM | POA: Diagnosis not present

## 2016-05-27 DIAGNOSIS — E1159 Type 2 diabetes mellitus with other circulatory complications: Secondary | ICD-10-CM | POA: Diagnosis not present

## 2016-05-27 DIAGNOSIS — I739 Peripheral vascular disease, unspecified: Secondary | ICD-10-CM | POA: Diagnosis not present

## 2016-05-27 DIAGNOSIS — I313 Pericardial effusion (noninflammatory): Secondary | ICD-10-CM | POA: Diagnosis not present

## 2016-05-27 DIAGNOSIS — N189 Chronic kidney disease, unspecified: Secondary | ICD-10-CM | POA: Diagnosis not present

## 2016-05-28 DIAGNOSIS — I25119 Atherosclerotic heart disease of native coronary artery with unspecified angina pectoris: Secondary | ICD-10-CM | POA: Diagnosis not present

## 2016-05-28 DIAGNOSIS — I313 Pericardial effusion (noninflammatory): Secondary | ICD-10-CM | POA: Diagnosis not present

## 2016-05-28 DIAGNOSIS — E1159 Type 2 diabetes mellitus with other circulatory complications: Secondary | ICD-10-CM | POA: Diagnosis not present

## 2016-05-28 DIAGNOSIS — I739 Peripheral vascular disease, unspecified: Secondary | ICD-10-CM | POA: Diagnosis not present

## 2016-05-28 DIAGNOSIS — I5030 Unspecified diastolic (congestive) heart failure: Secondary | ICD-10-CM | POA: Diagnosis not present

## 2016-05-28 DIAGNOSIS — N189 Chronic kidney disease, unspecified: Secondary | ICD-10-CM | POA: Diagnosis not present

## 2016-06-02 DIAGNOSIS — I25119 Atherosclerotic heart disease of native coronary artery with unspecified angina pectoris: Secondary | ICD-10-CM | POA: Diagnosis not present

## 2016-06-02 DIAGNOSIS — I5030 Unspecified diastolic (congestive) heart failure: Secondary | ICD-10-CM | POA: Diagnosis not present

## 2016-06-02 DIAGNOSIS — E1159 Type 2 diabetes mellitus with other circulatory complications: Secondary | ICD-10-CM | POA: Diagnosis not present

## 2016-06-02 DIAGNOSIS — I739 Peripheral vascular disease, unspecified: Secondary | ICD-10-CM | POA: Diagnosis not present

## 2016-06-02 DIAGNOSIS — I313 Pericardial effusion (noninflammatory): Secondary | ICD-10-CM | POA: Diagnosis not present

## 2016-06-02 DIAGNOSIS — N189 Chronic kidney disease, unspecified: Secondary | ICD-10-CM | POA: Diagnosis not present

## 2016-06-03 DIAGNOSIS — E1159 Type 2 diabetes mellitus with other circulatory complications: Secondary | ICD-10-CM | POA: Diagnosis not present

## 2016-06-03 DIAGNOSIS — I5030 Unspecified diastolic (congestive) heart failure: Secondary | ICD-10-CM | POA: Diagnosis not present

## 2016-06-03 DIAGNOSIS — I25119 Atherosclerotic heart disease of native coronary artery with unspecified angina pectoris: Secondary | ICD-10-CM | POA: Diagnosis not present

## 2016-06-03 DIAGNOSIS — I313 Pericardial effusion (noninflammatory): Secondary | ICD-10-CM | POA: Diagnosis not present

## 2016-06-03 DIAGNOSIS — N189 Chronic kidney disease, unspecified: Secondary | ICD-10-CM | POA: Diagnosis not present

## 2016-06-03 DIAGNOSIS — I739 Peripheral vascular disease, unspecified: Secondary | ICD-10-CM | POA: Diagnosis not present

## 2016-06-04 DIAGNOSIS — I25119 Atherosclerotic heart disease of native coronary artery with unspecified angina pectoris: Secondary | ICD-10-CM | POA: Diagnosis not present

## 2016-06-04 DIAGNOSIS — I313 Pericardial effusion (noninflammatory): Secondary | ICD-10-CM | POA: Diagnosis not present

## 2016-06-04 DIAGNOSIS — E1159 Type 2 diabetes mellitus with other circulatory complications: Secondary | ICD-10-CM | POA: Diagnosis not present

## 2016-06-04 DIAGNOSIS — N189 Chronic kidney disease, unspecified: Secondary | ICD-10-CM | POA: Diagnosis not present

## 2016-06-04 DIAGNOSIS — I739 Peripheral vascular disease, unspecified: Secondary | ICD-10-CM | POA: Diagnosis not present

## 2016-06-04 DIAGNOSIS — I5030 Unspecified diastolic (congestive) heart failure: Secondary | ICD-10-CM | POA: Diagnosis not present

## 2016-06-09 DIAGNOSIS — N401 Enlarged prostate with lower urinary tract symptoms: Secondary | ICD-10-CM | POA: Diagnosis not present

## 2016-06-09 DIAGNOSIS — I313 Pericardial effusion (noninflammatory): Secondary | ICD-10-CM | POA: Diagnosis not present

## 2016-06-09 DIAGNOSIS — J301 Allergic rhinitis due to pollen: Secondary | ICD-10-CM | POA: Diagnosis not present

## 2016-06-09 DIAGNOSIS — I10 Essential (primary) hypertension: Secondary | ICD-10-CM | POA: Diagnosis not present

## 2016-06-09 DIAGNOSIS — E1159 Type 2 diabetes mellitus with other circulatory complications: Secondary | ICD-10-CM | POA: Diagnosis not present

## 2016-06-09 DIAGNOSIS — I5030 Unspecified diastolic (congestive) heart failure: Secondary | ICD-10-CM | POA: Diagnosis not present

## 2016-06-09 DIAGNOSIS — I739 Peripheral vascular disease, unspecified: Secondary | ICD-10-CM | POA: Diagnosis not present

## 2016-06-09 DIAGNOSIS — I25119 Atherosclerotic heart disease of native coronary artery with unspecified angina pectoris: Secondary | ICD-10-CM | POA: Diagnosis not present

## 2016-06-09 DIAGNOSIS — M109 Gout, unspecified: Secondary | ICD-10-CM | POA: Diagnosis not present

## 2016-06-09 DIAGNOSIS — N189 Chronic kidney disease, unspecified: Secondary | ICD-10-CM | POA: Diagnosis not present

## 2016-06-09 DIAGNOSIS — E785 Hyperlipidemia, unspecified: Secondary | ICD-10-CM | POA: Diagnosis not present

## 2016-06-09 DIAGNOSIS — I4891 Unspecified atrial fibrillation: Secondary | ICD-10-CM | POA: Diagnosis not present

## 2016-06-10 DIAGNOSIS — I25119 Atherosclerotic heart disease of native coronary artery with unspecified angina pectoris: Secondary | ICD-10-CM | POA: Diagnosis not present

## 2016-06-10 DIAGNOSIS — E1159 Type 2 diabetes mellitus with other circulatory complications: Secondary | ICD-10-CM | POA: Diagnosis not present

## 2016-06-10 DIAGNOSIS — N189 Chronic kidney disease, unspecified: Secondary | ICD-10-CM | POA: Diagnosis not present

## 2016-06-10 DIAGNOSIS — I739 Peripheral vascular disease, unspecified: Secondary | ICD-10-CM | POA: Diagnosis not present

## 2016-06-10 DIAGNOSIS — I313 Pericardial effusion (noninflammatory): Secondary | ICD-10-CM | POA: Diagnosis not present

## 2016-06-10 DIAGNOSIS — I5030 Unspecified diastolic (congestive) heart failure: Secondary | ICD-10-CM | POA: Diagnosis not present

## 2016-06-11 DIAGNOSIS — E1159 Type 2 diabetes mellitus with other circulatory complications: Secondary | ICD-10-CM | POA: Diagnosis not present

## 2016-06-11 DIAGNOSIS — I25119 Atherosclerotic heart disease of native coronary artery with unspecified angina pectoris: Secondary | ICD-10-CM | POA: Diagnosis not present

## 2016-06-11 DIAGNOSIS — I313 Pericardial effusion (noninflammatory): Secondary | ICD-10-CM | POA: Diagnosis not present

## 2016-06-11 DIAGNOSIS — I5030 Unspecified diastolic (congestive) heart failure: Secondary | ICD-10-CM | POA: Diagnosis not present

## 2016-06-11 DIAGNOSIS — N189 Chronic kidney disease, unspecified: Secondary | ICD-10-CM | POA: Diagnosis not present

## 2016-06-11 DIAGNOSIS — I739 Peripheral vascular disease, unspecified: Secondary | ICD-10-CM | POA: Diagnosis not present

## 2016-06-16 DIAGNOSIS — I739 Peripheral vascular disease, unspecified: Secondary | ICD-10-CM | POA: Diagnosis not present

## 2016-06-16 DIAGNOSIS — I5030 Unspecified diastolic (congestive) heart failure: Secondary | ICD-10-CM | POA: Diagnosis not present

## 2016-06-16 DIAGNOSIS — I25119 Atherosclerotic heart disease of native coronary artery with unspecified angina pectoris: Secondary | ICD-10-CM | POA: Diagnosis not present

## 2016-06-16 DIAGNOSIS — E1159 Type 2 diabetes mellitus with other circulatory complications: Secondary | ICD-10-CM | POA: Diagnosis not present

## 2016-06-16 DIAGNOSIS — N189 Chronic kidney disease, unspecified: Secondary | ICD-10-CM | POA: Diagnosis not present

## 2016-06-16 DIAGNOSIS — I313 Pericardial effusion (noninflammatory): Secondary | ICD-10-CM | POA: Diagnosis not present

## 2016-06-18 DIAGNOSIS — N189 Chronic kidney disease, unspecified: Secondary | ICD-10-CM | POA: Diagnosis not present

## 2016-06-18 DIAGNOSIS — I313 Pericardial effusion (noninflammatory): Secondary | ICD-10-CM | POA: Diagnosis not present

## 2016-06-18 DIAGNOSIS — E1159 Type 2 diabetes mellitus with other circulatory complications: Secondary | ICD-10-CM | POA: Diagnosis not present

## 2016-06-18 DIAGNOSIS — I5030 Unspecified diastolic (congestive) heart failure: Secondary | ICD-10-CM | POA: Diagnosis not present

## 2016-06-18 DIAGNOSIS — I739 Peripheral vascular disease, unspecified: Secondary | ICD-10-CM | POA: Diagnosis not present

## 2016-06-18 DIAGNOSIS — I25119 Atherosclerotic heart disease of native coronary artery with unspecified angina pectoris: Secondary | ICD-10-CM | POA: Diagnosis not present

## 2016-06-22 DIAGNOSIS — I739 Peripheral vascular disease, unspecified: Secondary | ICD-10-CM | POA: Diagnosis not present

## 2016-06-22 DIAGNOSIS — I313 Pericardial effusion (noninflammatory): Secondary | ICD-10-CM | POA: Diagnosis not present

## 2016-06-22 DIAGNOSIS — I5030 Unspecified diastolic (congestive) heart failure: Secondary | ICD-10-CM | POA: Diagnosis not present

## 2016-06-22 DIAGNOSIS — E1159 Type 2 diabetes mellitus with other circulatory complications: Secondary | ICD-10-CM | POA: Diagnosis not present

## 2016-06-22 DIAGNOSIS — I25119 Atherosclerotic heart disease of native coronary artery with unspecified angina pectoris: Secondary | ICD-10-CM | POA: Diagnosis not present

## 2016-06-22 DIAGNOSIS — N189 Chronic kidney disease, unspecified: Secondary | ICD-10-CM | POA: Diagnosis not present

## 2016-06-23 DIAGNOSIS — E1159 Type 2 diabetes mellitus with other circulatory complications: Secondary | ICD-10-CM | POA: Diagnosis not present

## 2016-06-23 DIAGNOSIS — I739 Peripheral vascular disease, unspecified: Secondary | ICD-10-CM | POA: Diagnosis not present

## 2016-06-23 DIAGNOSIS — I5030 Unspecified diastolic (congestive) heart failure: Secondary | ICD-10-CM | POA: Diagnosis not present

## 2016-06-23 DIAGNOSIS — N189 Chronic kidney disease, unspecified: Secondary | ICD-10-CM | POA: Diagnosis not present

## 2016-06-23 DIAGNOSIS — I313 Pericardial effusion (noninflammatory): Secondary | ICD-10-CM | POA: Diagnosis not present

## 2016-06-23 DIAGNOSIS — I25119 Atherosclerotic heart disease of native coronary artery with unspecified angina pectoris: Secondary | ICD-10-CM | POA: Diagnosis not present

## 2016-06-25 DIAGNOSIS — I5030 Unspecified diastolic (congestive) heart failure: Secondary | ICD-10-CM | POA: Diagnosis not present

## 2016-06-25 DIAGNOSIS — I25119 Atherosclerotic heart disease of native coronary artery with unspecified angina pectoris: Secondary | ICD-10-CM | POA: Diagnosis not present

## 2016-06-25 DIAGNOSIS — I313 Pericardial effusion (noninflammatory): Secondary | ICD-10-CM | POA: Diagnosis not present

## 2016-06-25 DIAGNOSIS — N189 Chronic kidney disease, unspecified: Secondary | ICD-10-CM | POA: Diagnosis not present

## 2016-06-25 DIAGNOSIS — E1159 Type 2 diabetes mellitus with other circulatory complications: Secondary | ICD-10-CM | POA: Diagnosis not present

## 2016-06-25 DIAGNOSIS — I739 Peripheral vascular disease, unspecified: Secondary | ICD-10-CM | POA: Diagnosis not present

## 2016-06-30 DIAGNOSIS — I739 Peripheral vascular disease, unspecified: Secondary | ICD-10-CM | POA: Diagnosis not present

## 2016-06-30 DIAGNOSIS — I5030 Unspecified diastolic (congestive) heart failure: Secondary | ICD-10-CM | POA: Diagnosis not present

## 2016-06-30 DIAGNOSIS — N189 Chronic kidney disease, unspecified: Secondary | ICD-10-CM | POA: Diagnosis not present

## 2016-06-30 DIAGNOSIS — I313 Pericardial effusion (noninflammatory): Secondary | ICD-10-CM | POA: Diagnosis not present

## 2016-06-30 DIAGNOSIS — I25119 Atherosclerotic heart disease of native coronary artery with unspecified angina pectoris: Secondary | ICD-10-CM | POA: Diagnosis not present

## 2016-06-30 DIAGNOSIS — E1159 Type 2 diabetes mellitus with other circulatory complications: Secondary | ICD-10-CM | POA: Diagnosis not present

## 2016-07-02 DIAGNOSIS — I25119 Atherosclerotic heart disease of native coronary artery with unspecified angina pectoris: Secondary | ICD-10-CM | POA: Diagnosis not present

## 2016-07-02 DIAGNOSIS — I5030 Unspecified diastolic (congestive) heart failure: Secondary | ICD-10-CM | POA: Diagnosis not present

## 2016-07-02 DIAGNOSIS — I739 Peripheral vascular disease, unspecified: Secondary | ICD-10-CM | POA: Diagnosis not present

## 2016-07-02 DIAGNOSIS — I313 Pericardial effusion (noninflammatory): Secondary | ICD-10-CM | POA: Diagnosis not present

## 2016-07-02 DIAGNOSIS — E1159 Type 2 diabetes mellitus with other circulatory complications: Secondary | ICD-10-CM | POA: Diagnosis not present

## 2016-07-02 DIAGNOSIS — N189 Chronic kidney disease, unspecified: Secondary | ICD-10-CM | POA: Diagnosis not present

## 2016-07-03 DIAGNOSIS — I313 Pericardial effusion (noninflammatory): Secondary | ICD-10-CM | POA: Diagnosis not present

## 2016-07-03 DIAGNOSIS — E1159 Type 2 diabetes mellitus with other circulatory complications: Secondary | ICD-10-CM | POA: Diagnosis not present

## 2016-07-03 DIAGNOSIS — I25119 Atherosclerotic heart disease of native coronary artery with unspecified angina pectoris: Secondary | ICD-10-CM | POA: Diagnosis not present

## 2016-07-03 DIAGNOSIS — I739 Peripheral vascular disease, unspecified: Secondary | ICD-10-CM | POA: Diagnosis not present

## 2016-07-03 DIAGNOSIS — N189 Chronic kidney disease, unspecified: Secondary | ICD-10-CM | POA: Diagnosis not present

## 2016-07-03 DIAGNOSIS — I5030 Unspecified diastolic (congestive) heart failure: Secondary | ICD-10-CM | POA: Diagnosis not present

## 2016-07-07 DIAGNOSIS — N189 Chronic kidney disease, unspecified: Secondary | ICD-10-CM | POA: Diagnosis not present

## 2016-07-07 DIAGNOSIS — I313 Pericardial effusion (noninflammatory): Secondary | ICD-10-CM | POA: Diagnosis not present

## 2016-07-07 DIAGNOSIS — I739 Peripheral vascular disease, unspecified: Secondary | ICD-10-CM | POA: Diagnosis not present

## 2016-07-07 DIAGNOSIS — I25119 Atherosclerotic heart disease of native coronary artery with unspecified angina pectoris: Secondary | ICD-10-CM | POA: Diagnosis not present

## 2016-07-07 DIAGNOSIS — I5030 Unspecified diastolic (congestive) heart failure: Secondary | ICD-10-CM | POA: Diagnosis not present

## 2016-07-07 DIAGNOSIS — E1159 Type 2 diabetes mellitus with other circulatory complications: Secondary | ICD-10-CM | POA: Diagnosis not present

## 2016-07-09 DIAGNOSIS — I5030 Unspecified diastolic (congestive) heart failure: Secondary | ICD-10-CM | POA: Diagnosis not present

## 2016-07-09 DIAGNOSIS — N189 Chronic kidney disease, unspecified: Secondary | ICD-10-CM | POA: Diagnosis not present

## 2016-07-09 DIAGNOSIS — I313 Pericardial effusion (noninflammatory): Secondary | ICD-10-CM | POA: Diagnosis not present

## 2016-07-09 DIAGNOSIS — E1159 Type 2 diabetes mellitus with other circulatory complications: Secondary | ICD-10-CM | POA: Diagnosis not present

## 2016-07-09 DIAGNOSIS — I25119 Atherosclerotic heart disease of native coronary artery with unspecified angina pectoris: Secondary | ICD-10-CM | POA: Diagnosis not present

## 2016-07-09 DIAGNOSIS — I739 Peripheral vascular disease, unspecified: Secondary | ICD-10-CM | POA: Diagnosis not present

## 2016-07-10 DIAGNOSIS — N189 Chronic kidney disease, unspecified: Secondary | ICD-10-CM | POA: Diagnosis not present

## 2016-07-10 DIAGNOSIS — I25119 Atherosclerotic heart disease of native coronary artery with unspecified angina pectoris: Secondary | ICD-10-CM | POA: Diagnosis not present

## 2016-07-10 DIAGNOSIS — N401 Enlarged prostate with lower urinary tract symptoms: Secondary | ICD-10-CM | POA: Diagnosis not present

## 2016-07-10 DIAGNOSIS — E785 Hyperlipidemia, unspecified: Secondary | ICD-10-CM | POA: Diagnosis not present

## 2016-07-10 DIAGNOSIS — I739 Peripheral vascular disease, unspecified: Secondary | ICD-10-CM | POA: Diagnosis not present

## 2016-07-10 DIAGNOSIS — I5030 Unspecified diastolic (congestive) heart failure: Secondary | ICD-10-CM | POA: Diagnosis not present

## 2016-07-10 DIAGNOSIS — M1990 Unspecified osteoarthritis, unspecified site: Secondary | ICD-10-CM | POA: Diagnosis not present

## 2016-07-10 DIAGNOSIS — I10 Essential (primary) hypertension: Secondary | ICD-10-CM | POA: Diagnosis not present

## 2016-07-10 DIAGNOSIS — J301 Allergic rhinitis due to pollen: Secondary | ICD-10-CM | POA: Diagnosis not present

## 2016-07-10 DIAGNOSIS — E1159 Type 2 diabetes mellitus with other circulatory complications: Secondary | ICD-10-CM | POA: Diagnosis not present

## 2016-07-10 DIAGNOSIS — I313 Pericardial effusion (noninflammatory): Secondary | ICD-10-CM | POA: Diagnosis not present

## 2016-07-10 DIAGNOSIS — M109 Gout, unspecified: Secondary | ICD-10-CM | POA: Diagnosis not present

## 2016-07-10 DIAGNOSIS — I4891 Unspecified atrial fibrillation: Secondary | ICD-10-CM | POA: Diagnosis not present

## 2016-07-14 DIAGNOSIS — I739 Peripheral vascular disease, unspecified: Secondary | ICD-10-CM | POA: Diagnosis not present

## 2016-07-14 DIAGNOSIS — I313 Pericardial effusion (noninflammatory): Secondary | ICD-10-CM | POA: Diagnosis not present

## 2016-07-14 DIAGNOSIS — E1159 Type 2 diabetes mellitus with other circulatory complications: Secondary | ICD-10-CM | POA: Diagnosis not present

## 2016-07-14 DIAGNOSIS — I5030 Unspecified diastolic (congestive) heart failure: Secondary | ICD-10-CM | POA: Diagnosis not present

## 2016-07-14 DIAGNOSIS — I25119 Atherosclerotic heart disease of native coronary artery with unspecified angina pectoris: Secondary | ICD-10-CM | POA: Diagnosis not present

## 2016-07-14 DIAGNOSIS — N189 Chronic kidney disease, unspecified: Secondary | ICD-10-CM | POA: Diagnosis not present

## 2016-07-15 DIAGNOSIS — I313 Pericardial effusion (noninflammatory): Secondary | ICD-10-CM | POA: Diagnosis not present

## 2016-07-15 DIAGNOSIS — E1159 Type 2 diabetes mellitus with other circulatory complications: Secondary | ICD-10-CM | POA: Diagnosis not present

## 2016-07-15 DIAGNOSIS — I25119 Atherosclerotic heart disease of native coronary artery with unspecified angina pectoris: Secondary | ICD-10-CM | POA: Diagnosis not present

## 2016-07-15 DIAGNOSIS — N189 Chronic kidney disease, unspecified: Secondary | ICD-10-CM | POA: Diagnosis not present

## 2016-07-15 DIAGNOSIS — I739 Peripheral vascular disease, unspecified: Secondary | ICD-10-CM | POA: Diagnosis not present

## 2016-07-15 DIAGNOSIS — I5030 Unspecified diastolic (congestive) heart failure: Secondary | ICD-10-CM | POA: Diagnosis not present

## 2016-07-16 DIAGNOSIS — I25119 Atherosclerotic heart disease of native coronary artery with unspecified angina pectoris: Secondary | ICD-10-CM | POA: Diagnosis not present

## 2016-07-16 DIAGNOSIS — I5030 Unspecified diastolic (congestive) heart failure: Secondary | ICD-10-CM | POA: Diagnosis not present

## 2016-07-16 DIAGNOSIS — I739 Peripheral vascular disease, unspecified: Secondary | ICD-10-CM | POA: Diagnosis not present

## 2016-07-16 DIAGNOSIS — N189 Chronic kidney disease, unspecified: Secondary | ICD-10-CM | POA: Diagnosis not present

## 2016-07-16 DIAGNOSIS — E1159 Type 2 diabetes mellitus with other circulatory complications: Secondary | ICD-10-CM | POA: Diagnosis not present

## 2016-07-16 DIAGNOSIS — I313 Pericardial effusion (noninflammatory): Secondary | ICD-10-CM | POA: Diagnosis not present

## 2016-07-21 DIAGNOSIS — I313 Pericardial effusion (noninflammatory): Secondary | ICD-10-CM | POA: Diagnosis not present

## 2016-07-21 DIAGNOSIS — E1159 Type 2 diabetes mellitus with other circulatory complications: Secondary | ICD-10-CM | POA: Diagnosis not present

## 2016-07-21 DIAGNOSIS — I739 Peripheral vascular disease, unspecified: Secondary | ICD-10-CM | POA: Diagnosis not present

## 2016-07-21 DIAGNOSIS — N189 Chronic kidney disease, unspecified: Secondary | ICD-10-CM | POA: Diagnosis not present

## 2016-07-21 DIAGNOSIS — I5030 Unspecified diastolic (congestive) heart failure: Secondary | ICD-10-CM | POA: Diagnosis not present

## 2016-07-21 DIAGNOSIS — I25119 Atherosclerotic heart disease of native coronary artery with unspecified angina pectoris: Secondary | ICD-10-CM | POA: Diagnosis not present

## 2016-07-23 DIAGNOSIS — N189 Chronic kidney disease, unspecified: Secondary | ICD-10-CM | POA: Diagnosis not present

## 2016-07-23 DIAGNOSIS — I5030 Unspecified diastolic (congestive) heart failure: Secondary | ICD-10-CM | POA: Diagnosis not present

## 2016-07-23 DIAGNOSIS — I739 Peripheral vascular disease, unspecified: Secondary | ICD-10-CM | POA: Diagnosis not present

## 2016-07-23 DIAGNOSIS — I25119 Atherosclerotic heart disease of native coronary artery with unspecified angina pectoris: Secondary | ICD-10-CM | POA: Diagnosis not present

## 2016-07-23 DIAGNOSIS — I313 Pericardial effusion (noninflammatory): Secondary | ICD-10-CM | POA: Diagnosis not present

## 2016-07-23 DIAGNOSIS — E1159 Type 2 diabetes mellitus with other circulatory complications: Secondary | ICD-10-CM | POA: Diagnosis not present

## 2016-07-28 DIAGNOSIS — E1159 Type 2 diabetes mellitus with other circulatory complications: Secondary | ICD-10-CM | POA: Diagnosis not present

## 2016-07-28 DIAGNOSIS — N189 Chronic kidney disease, unspecified: Secondary | ICD-10-CM | POA: Diagnosis not present

## 2016-07-28 DIAGNOSIS — I5030 Unspecified diastolic (congestive) heart failure: Secondary | ICD-10-CM | POA: Diagnosis not present

## 2016-07-28 DIAGNOSIS — I313 Pericardial effusion (noninflammatory): Secondary | ICD-10-CM | POA: Diagnosis not present

## 2016-07-28 DIAGNOSIS — I25119 Atherosclerotic heart disease of native coronary artery with unspecified angina pectoris: Secondary | ICD-10-CM | POA: Diagnosis not present

## 2016-07-28 DIAGNOSIS — I739 Peripheral vascular disease, unspecified: Secondary | ICD-10-CM | POA: Diagnosis not present

## 2016-07-30 DIAGNOSIS — E1159 Type 2 diabetes mellitus with other circulatory complications: Secondary | ICD-10-CM | POA: Diagnosis not present

## 2016-07-30 DIAGNOSIS — I5030 Unspecified diastolic (congestive) heart failure: Secondary | ICD-10-CM | POA: Diagnosis not present

## 2016-07-30 DIAGNOSIS — I739 Peripheral vascular disease, unspecified: Secondary | ICD-10-CM | POA: Diagnosis not present

## 2016-07-30 DIAGNOSIS — I313 Pericardial effusion (noninflammatory): Secondary | ICD-10-CM | POA: Diagnosis not present

## 2016-07-30 DIAGNOSIS — N189 Chronic kidney disease, unspecified: Secondary | ICD-10-CM | POA: Diagnosis not present

## 2016-07-30 DIAGNOSIS — I25119 Atherosclerotic heart disease of native coronary artery with unspecified angina pectoris: Secondary | ICD-10-CM | POA: Diagnosis not present

## 2016-08-04 DIAGNOSIS — I5030 Unspecified diastolic (congestive) heart failure: Secondary | ICD-10-CM | POA: Diagnosis not present

## 2016-08-04 DIAGNOSIS — E1159 Type 2 diabetes mellitus with other circulatory complications: Secondary | ICD-10-CM | POA: Diagnosis not present

## 2016-08-04 DIAGNOSIS — I313 Pericardial effusion (noninflammatory): Secondary | ICD-10-CM | POA: Diagnosis not present

## 2016-08-04 DIAGNOSIS — I25119 Atherosclerotic heart disease of native coronary artery with unspecified angina pectoris: Secondary | ICD-10-CM | POA: Diagnosis not present

## 2016-08-04 DIAGNOSIS — N189 Chronic kidney disease, unspecified: Secondary | ICD-10-CM | POA: Diagnosis not present

## 2016-08-04 DIAGNOSIS — I739 Peripheral vascular disease, unspecified: Secondary | ICD-10-CM | POA: Diagnosis not present

## 2016-08-05 DIAGNOSIS — I5030 Unspecified diastolic (congestive) heart failure: Secondary | ICD-10-CM | POA: Diagnosis not present

## 2016-08-05 DIAGNOSIS — N189 Chronic kidney disease, unspecified: Secondary | ICD-10-CM | POA: Diagnosis not present

## 2016-08-05 DIAGNOSIS — I739 Peripheral vascular disease, unspecified: Secondary | ICD-10-CM | POA: Diagnosis not present

## 2016-08-05 DIAGNOSIS — I25119 Atherosclerotic heart disease of native coronary artery with unspecified angina pectoris: Secondary | ICD-10-CM | POA: Diagnosis not present

## 2016-08-05 DIAGNOSIS — I313 Pericardial effusion (noninflammatory): Secondary | ICD-10-CM | POA: Diagnosis not present

## 2016-08-05 DIAGNOSIS — E1159 Type 2 diabetes mellitus with other circulatory complications: Secondary | ICD-10-CM | POA: Diagnosis not present

## 2016-08-06 DIAGNOSIS — I5030 Unspecified diastolic (congestive) heart failure: Secondary | ICD-10-CM | POA: Diagnosis not present

## 2016-08-06 DIAGNOSIS — I313 Pericardial effusion (noninflammatory): Secondary | ICD-10-CM | POA: Diagnosis not present

## 2016-08-06 DIAGNOSIS — I25119 Atherosclerotic heart disease of native coronary artery with unspecified angina pectoris: Secondary | ICD-10-CM | POA: Diagnosis not present

## 2016-08-06 DIAGNOSIS — I739 Peripheral vascular disease, unspecified: Secondary | ICD-10-CM | POA: Diagnosis not present

## 2016-08-06 DIAGNOSIS — N189 Chronic kidney disease, unspecified: Secondary | ICD-10-CM | POA: Diagnosis not present

## 2016-08-06 DIAGNOSIS — E1159 Type 2 diabetes mellitus with other circulatory complications: Secondary | ICD-10-CM | POA: Diagnosis not present

## 2016-08-09 DIAGNOSIS — I25119 Atherosclerotic heart disease of native coronary artery with unspecified angina pectoris: Secondary | ICD-10-CM | POA: Diagnosis not present

## 2016-08-09 DIAGNOSIS — E785 Hyperlipidemia, unspecified: Secondary | ICD-10-CM | POA: Diagnosis not present

## 2016-08-09 DIAGNOSIS — N401 Enlarged prostate with lower urinary tract symptoms: Secondary | ICD-10-CM | POA: Diagnosis not present

## 2016-08-09 DIAGNOSIS — M109 Gout, unspecified: Secondary | ICD-10-CM | POA: Diagnosis not present

## 2016-08-09 DIAGNOSIS — M1990 Unspecified osteoarthritis, unspecified site: Secondary | ICD-10-CM | POA: Diagnosis not present

## 2016-08-09 DIAGNOSIS — E1159 Type 2 diabetes mellitus with other circulatory complications: Secondary | ICD-10-CM | POA: Diagnosis not present

## 2016-08-09 DIAGNOSIS — I4891 Unspecified atrial fibrillation: Secondary | ICD-10-CM | POA: Diagnosis not present

## 2016-08-09 DIAGNOSIS — J301 Allergic rhinitis due to pollen: Secondary | ICD-10-CM | POA: Diagnosis not present

## 2016-08-09 DIAGNOSIS — I10 Essential (primary) hypertension: Secondary | ICD-10-CM | POA: Diagnosis not present

## 2016-08-09 DIAGNOSIS — I5030 Unspecified diastolic (congestive) heart failure: Secondary | ICD-10-CM | POA: Diagnosis not present

## 2016-08-09 DIAGNOSIS — I313 Pericardial effusion (noninflammatory): Secondary | ICD-10-CM | POA: Diagnosis not present

## 2016-08-09 DIAGNOSIS — N189 Chronic kidney disease, unspecified: Secondary | ICD-10-CM | POA: Diagnosis not present

## 2016-08-09 DIAGNOSIS — I739 Peripheral vascular disease, unspecified: Secondary | ICD-10-CM | POA: Diagnosis not present

## 2016-08-11 DIAGNOSIS — N189 Chronic kidney disease, unspecified: Secondary | ICD-10-CM | POA: Diagnosis not present

## 2016-08-11 DIAGNOSIS — I739 Peripheral vascular disease, unspecified: Secondary | ICD-10-CM | POA: Diagnosis not present

## 2016-08-11 DIAGNOSIS — I313 Pericardial effusion (noninflammatory): Secondary | ICD-10-CM | POA: Diagnosis not present

## 2016-08-11 DIAGNOSIS — E1159 Type 2 diabetes mellitus with other circulatory complications: Secondary | ICD-10-CM | POA: Diagnosis not present

## 2016-08-11 DIAGNOSIS — I25119 Atherosclerotic heart disease of native coronary artery with unspecified angina pectoris: Secondary | ICD-10-CM | POA: Diagnosis not present

## 2016-08-11 DIAGNOSIS — I5030 Unspecified diastolic (congestive) heart failure: Secondary | ICD-10-CM | POA: Diagnosis not present

## 2016-08-12 DIAGNOSIS — I25119 Atherosclerotic heart disease of native coronary artery with unspecified angina pectoris: Secondary | ICD-10-CM | POA: Diagnosis not present

## 2016-08-12 DIAGNOSIS — I313 Pericardial effusion (noninflammatory): Secondary | ICD-10-CM | POA: Diagnosis not present

## 2016-08-12 DIAGNOSIS — N189 Chronic kidney disease, unspecified: Secondary | ICD-10-CM | POA: Diagnosis not present

## 2016-08-12 DIAGNOSIS — I739 Peripheral vascular disease, unspecified: Secondary | ICD-10-CM | POA: Diagnosis not present

## 2016-08-12 DIAGNOSIS — E1159 Type 2 diabetes mellitus with other circulatory complications: Secondary | ICD-10-CM | POA: Diagnosis not present

## 2016-08-12 DIAGNOSIS — I5030 Unspecified diastolic (congestive) heart failure: Secondary | ICD-10-CM | POA: Diagnosis not present

## 2016-08-13 DIAGNOSIS — N189 Chronic kidney disease, unspecified: Secondary | ICD-10-CM | POA: Diagnosis not present

## 2016-08-13 DIAGNOSIS — I313 Pericardial effusion (noninflammatory): Secondary | ICD-10-CM | POA: Diagnosis not present

## 2016-08-13 DIAGNOSIS — I5030 Unspecified diastolic (congestive) heart failure: Secondary | ICD-10-CM | POA: Diagnosis not present

## 2016-08-13 DIAGNOSIS — I25119 Atherosclerotic heart disease of native coronary artery with unspecified angina pectoris: Secondary | ICD-10-CM | POA: Diagnosis not present

## 2016-08-13 DIAGNOSIS — E1159 Type 2 diabetes mellitus with other circulatory complications: Secondary | ICD-10-CM | POA: Diagnosis not present

## 2016-08-13 DIAGNOSIS — I739 Peripheral vascular disease, unspecified: Secondary | ICD-10-CM | POA: Diagnosis not present

## 2016-08-18 DIAGNOSIS — I739 Peripheral vascular disease, unspecified: Secondary | ICD-10-CM | POA: Diagnosis not present

## 2016-08-18 DIAGNOSIS — E1159 Type 2 diabetes mellitus with other circulatory complications: Secondary | ICD-10-CM | POA: Diagnosis not present

## 2016-08-18 DIAGNOSIS — I5030 Unspecified diastolic (congestive) heart failure: Secondary | ICD-10-CM | POA: Diagnosis not present

## 2016-08-18 DIAGNOSIS — I25119 Atherosclerotic heart disease of native coronary artery with unspecified angina pectoris: Secondary | ICD-10-CM | POA: Diagnosis not present

## 2016-08-18 DIAGNOSIS — N189 Chronic kidney disease, unspecified: Secondary | ICD-10-CM | POA: Diagnosis not present

## 2016-08-18 DIAGNOSIS — I313 Pericardial effusion (noninflammatory): Secondary | ICD-10-CM | POA: Diagnosis not present

## 2016-08-20 DIAGNOSIS — I5030 Unspecified diastolic (congestive) heart failure: Secondary | ICD-10-CM | POA: Diagnosis not present

## 2016-08-20 DIAGNOSIS — I739 Peripheral vascular disease, unspecified: Secondary | ICD-10-CM | POA: Diagnosis not present

## 2016-08-20 DIAGNOSIS — I25119 Atherosclerotic heart disease of native coronary artery with unspecified angina pectoris: Secondary | ICD-10-CM | POA: Diagnosis not present

## 2016-08-20 DIAGNOSIS — I313 Pericardial effusion (noninflammatory): Secondary | ICD-10-CM | POA: Diagnosis not present

## 2016-08-20 DIAGNOSIS — E1159 Type 2 diabetes mellitus with other circulatory complications: Secondary | ICD-10-CM | POA: Diagnosis not present

## 2016-08-20 DIAGNOSIS — N189 Chronic kidney disease, unspecified: Secondary | ICD-10-CM | POA: Diagnosis not present

## 2016-08-21 DIAGNOSIS — I313 Pericardial effusion (noninflammatory): Secondary | ICD-10-CM | POA: Diagnosis not present

## 2016-08-21 DIAGNOSIS — I25119 Atherosclerotic heart disease of native coronary artery with unspecified angina pectoris: Secondary | ICD-10-CM | POA: Diagnosis not present

## 2016-08-21 DIAGNOSIS — I5030 Unspecified diastolic (congestive) heart failure: Secondary | ICD-10-CM | POA: Diagnosis not present

## 2016-08-21 DIAGNOSIS — N189 Chronic kidney disease, unspecified: Secondary | ICD-10-CM | POA: Diagnosis not present

## 2016-08-21 DIAGNOSIS — E1159 Type 2 diabetes mellitus with other circulatory complications: Secondary | ICD-10-CM | POA: Diagnosis not present

## 2016-08-21 DIAGNOSIS — I739 Peripheral vascular disease, unspecified: Secondary | ICD-10-CM | POA: Diagnosis not present

## 2016-08-25 DIAGNOSIS — I313 Pericardial effusion (noninflammatory): Secondary | ICD-10-CM | POA: Diagnosis not present

## 2016-08-25 DIAGNOSIS — I739 Peripheral vascular disease, unspecified: Secondary | ICD-10-CM | POA: Diagnosis not present

## 2016-08-25 DIAGNOSIS — N189 Chronic kidney disease, unspecified: Secondary | ICD-10-CM | POA: Diagnosis not present

## 2016-08-25 DIAGNOSIS — I5030 Unspecified diastolic (congestive) heart failure: Secondary | ICD-10-CM | POA: Diagnosis not present

## 2016-08-25 DIAGNOSIS — E1159 Type 2 diabetes mellitus with other circulatory complications: Secondary | ICD-10-CM | POA: Diagnosis not present

## 2016-08-25 DIAGNOSIS — I25119 Atherosclerotic heart disease of native coronary artery with unspecified angina pectoris: Secondary | ICD-10-CM | POA: Diagnosis not present

## 2016-08-26 DIAGNOSIS — N189 Chronic kidney disease, unspecified: Secondary | ICD-10-CM | POA: Diagnosis not present

## 2016-08-26 DIAGNOSIS — E1159 Type 2 diabetes mellitus with other circulatory complications: Secondary | ICD-10-CM | POA: Diagnosis not present

## 2016-08-26 DIAGNOSIS — I739 Peripheral vascular disease, unspecified: Secondary | ICD-10-CM | POA: Diagnosis not present

## 2016-08-26 DIAGNOSIS — I25119 Atherosclerotic heart disease of native coronary artery with unspecified angina pectoris: Secondary | ICD-10-CM | POA: Diagnosis not present

## 2016-08-26 DIAGNOSIS — I5030 Unspecified diastolic (congestive) heart failure: Secondary | ICD-10-CM | POA: Diagnosis not present

## 2016-08-26 DIAGNOSIS — I313 Pericardial effusion (noninflammatory): Secondary | ICD-10-CM | POA: Diagnosis not present

## 2016-08-27 DIAGNOSIS — I739 Peripheral vascular disease, unspecified: Secondary | ICD-10-CM | POA: Diagnosis not present

## 2016-08-27 DIAGNOSIS — E1159 Type 2 diabetes mellitus with other circulatory complications: Secondary | ICD-10-CM | POA: Diagnosis not present

## 2016-08-27 DIAGNOSIS — N189 Chronic kidney disease, unspecified: Secondary | ICD-10-CM | POA: Diagnosis not present

## 2016-08-27 DIAGNOSIS — I25119 Atherosclerotic heart disease of native coronary artery with unspecified angina pectoris: Secondary | ICD-10-CM | POA: Diagnosis not present

## 2016-08-27 DIAGNOSIS — I5030 Unspecified diastolic (congestive) heart failure: Secondary | ICD-10-CM | POA: Diagnosis not present

## 2016-08-27 DIAGNOSIS — I313 Pericardial effusion (noninflammatory): Secondary | ICD-10-CM | POA: Diagnosis not present

## 2016-08-28 DIAGNOSIS — I313 Pericardial effusion (noninflammatory): Secondary | ICD-10-CM | POA: Diagnosis not present

## 2016-08-28 DIAGNOSIS — E1159 Type 2 diabetes mellitus with other circulatory complications: Secondary | ICD-10-CM | POA: Diagnosis not present

## 2016-08-28 DIAGNOSIS — I739 Peripheral vascular disease, unspecified: Secondary | ICD-10-CM | POA: Diagnosis not present

## 2016-08-28 DIAGNOSIS — I5030 Unspecified diastolic (congestive) heart failure: Secondary | ICD-10-CM | POA: Diagnosis not present

## 2016-08-28 DIAGNOSIS — N189 Chronic kidney disease, unspecified: Secondary | ICD-10-CM | POA: Diagnosis not present

## 2016-08-28 DIAGNOSIS — I25119 Atherosclerotic heart disease of native coronary artery with unspecified angina pectoris: Secondary | ICD-10-CM | POA: Diagnosis not present

## 2016-08-31 DIAGNOSIS — E1159 Type 2 diabetes mellitus with other circulatory complications: Secondary | ICD-10-CM | POA: Diagnosis not present

## 2016-08-31 DIAGNOSIS — I313 Pericardial effusion (noninflammatory): Secondary | ICD-10-CM | POA: Diagnosis not present

## 2016-08-31 DIAGNOSIS — I5030 Unspecified diastolic (congestive) heart failure: Secondary | ICD-10-CM | POA: Diagnosis not present

## 2016-08-31 DIAGNOSIS — I739 Peripheral vascular disease, unspecified: Secondary | ICD-10-CM | POA: Diagnosis not present

## 2016-08-31 DIAGNOSIS — N189 Chronic kidney disease, unspecified: Secondary | ICD-10-CM | POA: Diagnosis not present

## 2016-08-31 DIAGNOSIS — I25119 Atherosclerotic heart disease of native coronary artery with unspecified angina pectoris: Secondary | ICD-10-CM | POA: Diagnosis not present

## 2016-09-01 DIAGNOSIS — I25119 Atherosclerotic heart disease of native coronary artery with unspecified angina pectoris: Secondary | ICD-10-CM | POA: Diagnosis not present

## 2016-09-01 DIAGNOSIS — I5030 Unspecified diastolic (congestive) heart failure: Secondary | ICD-10-CM | POA: Diagnosis not present

## 2016-09-01 DIAGNOSIS — I739 Peripheral vascular disease, unspecified: Secondary | ICD-10-CM | POA: Diagnosis not present

## 2016-09-01 DIAGNOSIS — I313 Pericardial effusion (noninflammatory): Secondary | ICD-10-CM | POA: Diagnosis not present

## 2016-09-01 DIAGNOSIS — N189 Chronic kidney disease, unspecified: Secondary | ICD-10-CM | POA: Diagnosis not present

## 2016-09-01 DIAGNOSIS — E1159 Type 2 diabetes mellitus with other circulatory complications: Secondary | ICD-10-CM | POA: Diagnosis not present

## 2016-09-03 DIAGNOSIS — I5030 Unspecified diastolic (congestive) heart failure: Secondary | ICD-10-CM | POA: Diagnosis not present

## 2016-09-03 DIAGNOSIS — I25119 Atherosclerotic heart disease of native coronary artery with unspecified angina pectoris: Secondary | ICD-10-CM | POA: Diagnosis not present

## 2016-09-03 DIAGNOSIS — E1159 Type 2 diabetes mellitus with other circulatory complications: Secondary | ICD-10-CM | POA: Diagnosis not present

## 2016-09-03 DIAGNOSIS — N189 Chronic kidney disease, unspecified: Secondary | ICD-10-CM | POA: Diagnosis not present

## 2016-09-03 DIAGNOSIS — I739 Peripheral vascular disease, unspecified: Secondary | ICD-10-CM | POA: Diagnosis not present

## 2016-09-03 DIAGNOSIS — I313 Pericardial effusion (noninflammatory): Secondary | ICD-10-CM | POA: Diagnosis not present

## 2016-09-06 DIAGNOSIS — I5030 Unspecified diastolic (congestive) heart failure: Secondary | ICD-10-CM | POA: Diagnosis not present

## 2016-09-06 DIAGNOSIS — I25119 Atherosclerotic heart disease of native coronary artery with unspecified angina pectoris: Secondary | ICD-10-CM | POA: Diagnosis not present

## 2016-09-06 DIAGNOSIS — I313 Pericardial effusion (noninflammatory): Secondary | ICD-10-CM | POA: Diagnosis not present

## 2016-09-06 DIAGNOSIS — I739 Peripheral vascular disease, unspecified: Secondary | ICD-10-CM | POA: Diagnosis not present

## 2016-09-06 DIAGNOSIS — N189 Chronic kidney disease, unspecified: Secondary | ICD-10-CM | POA: Diagnosis not present

## 2016-09-06 DIAGNOSIS — E1159 Type 2 diabetes mellitus with other circulatory complications: Secondary | ICD-10-CM | POA: Diagnosis not present

## 2016-09-08 DIAGNOSIS — I739 Peripheral vascular disease, unspecified: Secondary | ICD-10-CM | POA: Diagnosis not present

## 2016-09-08 DIAGNOSIS — E1159 Type 2 diabetes mellitus with other circulatory complications: Secondary | ICD-10-CM | POA: Diagnosis not present

## 2016-09-08 DIAGNOSIS — N189 Chronic kidney disease, unspecified: Secondary | ICD-10-CM | POA: Diagnosis not present

## 2016-09-08 DIAGNOSIS — I25119 Atherosclerotic heart disease of native coronary artery with unspecified angina pectoris: Secondary | ICD-10-CM | POA: Diagnosis not present

## 2016-09-08 DIAGNOSIS — I313 Pericardial effusion (noninflammatory): Secondary | ICD-10-CM | POA: Diagnosis not present

## 2016-09-08 DIAGNOSIS — I5030 Unspecified diastolic (congestive) heart failure: Secondary | ICD-10-CM | POA: Diagnosis not present

## 2016-09-09 DIAGNOSIS — E1159 Type 2 diabetes mellitus with other circulatory complications: Secondary | ICD-10-CM | POA: Diagnosis not present

## 2016-09-09 DIAGNOSIS — I313 Pericardial effusion (noninflammatory): Secondary | ICD-10-CM | POA: Diagnosis not present

## 2016-09-09 DIAGNOSIS — E785 Hyperlipidemia, unspecified: Secondary | ICD-10-CM | POA: Diagnosis not present

## 2016-09-09 DIAGNOSIS — I10 Essential (primary) hypertension: Secondary | ICD-10-CM | POA: Diagnosis not present

## 2016-09-09 DIAGNOSIS — N401 Enlarged prostate with lower urinary tract symptoms: Secondary | ICD-10-CM | POA: Diagnosis not present

## 2016-09-09 DIAGNOSIS — I25119 Atherosclerotic heart disease of native coronary artery with unspecified angina pectoris: Secondary | ICD-10-CM | POA: Diagnosis not present

## 2016-09-09 DIAGNOSIS — M1990 Unspecified osteoarthritis, unspecified site: Secondary | ICD-10-CM | POA: Diagnosis not present

## 2016-09-09 DIAGNOSIS — I739 Peripheral vascular disease, unspecified: Secondary | ICD-10-CM | POA: Diagnosis not present

## 2016-09-09 DIAGNOSIS — M109 Gout, unspecified: Secondary | ICD-10-CM | POA: Diagnosis not present

## 2016-09-09 DIAGNOSIS — N189 Chronic kidney disease, unspecified: Secondary | ICD-10-CM | POA: Diagnosis not present

## 2016-09-09 DIAGNOSIS — J301 Allergic rhinitis due to pollen: Secondary | ICD-10-CM | POA: Diagnosis not present

## 2016-09-09 DIAGNOSIS — I5030 Unspecified diastolic (congestive) heart failure: Secondary | ICD-10-CM | POA: Diagnosis not present

## 2016-09-09 DIAGNOSIS — I4891 Unspecified atrial fibrillation: Secondary | ICD-10-CM | POA: Diagnosis not present

## 2016-09-10 DIAGNOSIS — E1159 Type 2 diabetes mellitus with other circulatory complications: Secondary | ICD-10-CM | POA: Diagnosis not present

## 2016-09-10 DIAGNOSIS — I739 Peripheral vascular disease, unspecified: Secondary | ICD-10-CM | POA: Diagnosis not present

## 2016-09-10 DIAGNOSIS — I313 Pericardial effusion (noninflammatory): Secondary | ICD-10-CM | POA: Diagnosis not present

## 2016-09-10 DIAGNOSIS — I25119 Atherosclerotic heart disease of native coronary artery with unspecified angina pectoris: Secondary | ICD-10-CM | POA: Diagnosis not present

## 2016-09-10 DIAGNOSIS — I5030 Unspecified diastolic (congestive) heart failure: Secondary | ICD-10-CM | POA: Diagnosis not present

## 2016-09-10 DIAGNOSIS — N189 Chronic kidney disease, unspecified: Secondary | ICD-10-CM | POA: Diagnosis not present

## 2016-09-15 DIAGNOSIS — I5030 Unspecified diastolic (congestive) heart failure: Secondary | ICD-10-CM | POA: Diagnosis not present

## 2016-09-15 DIAGNOSIS — I313 Pericardial effusion (noninflammatory): Secondary | ICD-10-CM | POA: Diagnosis not present

## 2016-09-15 DIAGNOSIS — I739 Peripheral vascular disease, unspecified: Secondary | ICD-10-CM | POA: Diagnosis not present

## 2016-09-15 DIAGNOSIS — E1159 Type 2 diabetes mellitus with other circulatory complications: Secondary | ICD-10-CM | POA: Diagnosis not present

## 2016-09-15 DIAGNOSIS — I25119 Atherosclerotic heart disease of native coronary artery with unspecified angina pectoris: Secondary | ICD-10-CM | POA: Diagnosis not present

## 2016-09-15 DIAGNOSIS — N189 Chronic kidney disease, unspecified: Secondary | ICD-10-CM | POA: Diagnosis not present

## 2016-09-17 DIAGNOSIS — N189 Chronic kidney disease, unspecified: Secondary | ICD-10-CM | POA: Diagnosis not present

## 2016-09-17 DIAGNOSIS — I5030 Unspecified diastolic (congestive) heart failure: Secondary | ICD-10-CM | POA: Diagnosis not present

## 2016-09-17 DIAGNOSIS — I25119 Atherosclerotic heart disease of native coronary artery with unspecified angina pectoris: Secondary | ICD-10-CM | POA: Diagnosis not present

## 2016-09-17 DIAGNOSIS — I313 Pericardial effusion (noninflammatory): Secondary | ICD-10-CM | POA: Diagnosis not present

## 2016-09-17 DIAGNOSIS — E1159 Type 2 diabetes mellitus with other circulatory complications: Secondary | ICD-10-CM | POA: Diagnosis not present

## 2016-09-17 DIAGNOSIS — I739 Peripheral vascular disease, unspecified: Secondary | ICD-10-CM | POA: Diagnosis not present

## 2016-09-19 DIAGNOSIS — E1159 Type 2 diabetes mellitus with other circulatory complications: Secondary | ICD-10-CM | POA: Diagnosis not present

## 2016-09-19 DIAGNOSIS — I25119 Atherosclerotic heart disease of native coronary artery with unspecified angina pectoris: Secondary | ICD-10-CM | POA: Diagnosis not present

## 2016-09-19 DIAGNOSIS — I313 Pericardial effusion (noninflammatory): Secondary | ICD-10-CM | POA: Diagnosis not present

## 2016-09-19 DIAGNOSIS — N189 Chronic kidney disease, unspecified: Secondary | ICD-10-CM | POA: Diagnosis not present

## 2016-09-19 DIAGNOSIS — I739 Peripheral vascular disease, unspecified: Secondary | ICD-10-CM | POA: Diagnosis not present

## 2016-09-19 DIAGNOSIS — I5030 Unspecified diastolic (congestive) heart failure: Secondary | ICD-10-CM | POA: Diagnosis not present

## 2016-09-20 DIAGNOSIS — I739 Peripheral vascular disease, unspecified: Secondary | ICD-10-CM | POA: Diagnosis not present

## 2016-09-20 DIAGNOSIS — I313 Pericardial effusion (noninflammatory): Secondary | ICD-10-CM | POA: Diagnosis not present

## 2016-09-20 DIAGNOSIS — I5030 Unspecified diastolic (congestive) heart failure: Secondary | ICD-10-CM | POA: Diagnosis not present

## 2016-09-20 DIAGNOSIS — E1159 Type 2 diabetes mellitus with other circulatory complications: Secondary | ICD-10-CM | POA: Diagnosis not present

## 2016-09-20 DIAGNOSIS — I25119 Atherosclerotic heart disease of native coronary artery with unspecified angina pectoris: Secondary | ICD-10-CM | POA: Diagnosis not present

## 2016-09-20 DIAGNOSIS — N189 Chronic kidney disease, unspecified: Secondary | ICD-10-CM | POA: Diagnosis not present

## 2016-09-22 DIAGNOSIS — I313 Pericardial effusion (noninflammatory): Secondary | ICD-10-CM | POA: Diagnosis not present

## 2016-09-22 DIAGNOSIS — I25119 Atherosclerotic heart disease of native coronary artery with unspecified angina pectoris: Secondary | ICD-10-CM | POA: Diagnosis not present

## 2016-09-22 DIAGNOSIS — N189 Chronic kidney disease, unspecified: Secondary | ICD-10-CM | POA: Diagnosis not present

## 2016-09-22 DIAGNOSIS — I5030 Unspecified diastolic (congestive) heart failure: Secondary | ICD-10-CM | POA: Diagnosis not present

## 2016-09-22 DIAGNOSIS — E1159 Type 2 diabetes mellitus with other circulatory complications: Secondary | ICD-10-CM | POA: Diagnosis not present

## 2016-09-22 DIAGNOSIS — I739 Peripheral vascular disease, unspecified: Secondary | ICD-10-CM | POA: Diagnosis not present

## 2016-09-23 DIAGNOSIS — I5030 Unspecified diastolic (congestive) heart failure: Secondary | ICD-10-CM | POA: Diagnosis not present

## 2016-09-23 DIAGNOSIS — N189 Chronic kidney disease, unspecified: Secondary | ICD-10-CM | POA: Diagnosis not present

## 2016-09-23 DIAGNOSIS — E1159 Type 2 diabetes mellitus with other circulatory complications: Secondary | ICD-10-CM | POA: Diagnosis not present

## 2016-09-23 DIAGNOSIS — I25119 Atherosclerotic heart disease of native coronary artery with unspecified angina pectoris: Secondary | ICD-10-CM | POA: Diagnosis not present

## 2016-09-23 DIAGNOSIS — I313 Pericardial effusion (noninflammatory): Secondary | ICD-10-CM | POA: Diagnosis not present

## 2016-09-23 DIAGNOSIS — I739 Peripheral vascular disease, unspecified: Secondary | ICD-10-CM | POA: Diagnosis not present

## 2016-09-24 DIAGNOSIS — I5030 Unspecified diastolic (congestive) heart failure: Secondary | ICD-10-CM | POA: Diagnosis not present

## 2016-09-24 DIAGNOSIS — N189 Chronic kidney disease, unspecified: Secondary | ICD-10-CM | POA: Diagnosis not present

## 2016-09-24 DIAGNOSIS — I313 Pericardial effusion (noninflammatory): Secondary | ICD-10-CM | POA: Diagnosis not present

## 2016-09-24 DIAGNOSIS — I739 Peripheral vascular disease, unspecified: Secondary | ICD-10-CM | POA: Diagnosis not present

## 2016-09-24 DIAGNOSIS — I25119 Atherosclerotic heart disease of native coronary artery with unspecified angina pectoris: Secondary | ICD-10-CM | POA: Diagnosis not present

## 2016-09-24 DIAGNOSIS — E1159 Type 2 diabetes mellitus with other circulatory complications: Secondary | ICD-10-CM | POA: Diagnosis not present

## 2016-09-29 ENCOUNTER — Other Ambulatory Visit: Payer: Self-pay | Admitting: Internal Medicine

## 2016-09-29 DIAGNOSIS — I313 Pericardial effusion (noninflammatory): Secondary | ICD-10-CM | POA: Diagnosis not present

## 2016-09-29 DIAGNOSIS — I25119 Atherosclerotic heart disease of native coronary artery with unspecified angina pectoris: Secondary | ICD-10-CM | POA: Diagnosis not present

## 2016-09-29 DIAGNOSIS — I739 Peripheral vascular disease, unspecified: Secondary | ICD-10-CM | POA: Diagnosis not present

## 2016-09-29 DIAGNOSIS — N189 Chronic kidney disease, unspecified: Secondary | ICD-10-CM | POA: Diagnosis not present

## 2016-09-29 DIAGNOSIS — E1159 Type 2 diabetes mellitus with other circulatory complications: Secondary | ICD-10-CM | POA: Diagnosis not present

## 2016-09-29 DIAGNOSIS — I5030 Unspecified diastolic (congestive) heart failure: Secondary | ICD-10-CM | POA: Diagnosis not present

## 2016-09-30 DIAGNOSIS — I5030 Unspecified diastolic (congestive) heart failure: Secondary | ICD-10-CM | POA: Diagnosis not present

## 2016-09-30 DIAGNOSIS — I25119 Atherosclerotic heart disease of native coronary artery with unspecified angina pectoris: Secondary | ICD-10-CM | POA: Diagnosis not present

## 2016-09-30 DIAGNOSIS — N189 Chronic kidney disease, unspecified: Secondary | ICD-10-CM | POA: Diagnosis not present

## 2016-09-30 DIAGNOSIS — E1159 Type 2 diabetes mellitus with other circulatory complications: Secondary | ICD-10-CM | POA: Diagnosis not present

## 2016-09-30 DIAGNOSIS — I739 Peripheral vascular disease, unspecified: Secondary | ICD-10-CM | POA: Diagnosis not present

## 2016-09-30 DIAGNOSIS — I313 Pericardial effusion (noninflammatory): Secondary | ICD-10-CM | POA: Diagnosis not present

## 2016-10-01 DIAGNOSIS — N189 Chronic kidney disease, unspecified: Secondary | ICD-10-CM | POA: Diagnosis not present

## 2016-10-01 DIAGNOSIS — I313 Pericardial effusion (noninflammatory): Secondary | ICD-10-CM | POA: Diagnosis not present

## 2016-10-01 DIAGNOSIS — I739 Peripheral vascular disease, unspecified: Secondary | ICD-10-CM | POA: Diagnosis not present

## 2016-10-01 DIAGNOSIS — I5030 Unspecified diastolic (congestive) heart failure: Secondary | ICD-10-CM | POA: Diagnosis not present

## 2016-10-01 DIAGNOSIS — E1159 Type 2 diabetes mellitus with other circulatory complications: Secondary | ICD-10-CM | POA: Diagnosis not present

## 2016-10-01 DIAGNOSIS — I25119 Atherosclerotic heart disease of native coronary artery with unspecified angina pectoris: Secondary | ICD-10-CM | POA: Diagnosis not present

## 2016-10-06 DIAGNOSIS — N189 Chronic kidney disease, unspecified: Secondary | ICD-10-CM | POA: Diagnosis not present

## 2016-10-06 DIAGNOSIS — I25119 Atherosclerotic heart disease of native coronary artery with unspecified angina pectoris: Secondary | ICD-10-CM | POA: Diagnosis not present

## 2016-10-06 DIAGNOSIS — I5030 Unspecified diastolic (congestive) heart failure: Secondary | ICD-10-CM | POA: Diagnosis not present

## 2016-10-06 DIAGNOSIS — I313 Pericardial effusion (noninflammatory): Secondary | ICD-10-CM | POA: Diagnosis not present

## 2016-10-06 DIAGNOSIS — I739 Peripheral vascular disease, unspecified: Secondary | ICD-10-CM | POA: Diagnosis not present

## 2016-10-06 DIAGNOSIS — E1159 Type 2 diabetes mellitus with other circulatory complications: Secondary | ICD-10-CM | POA: Diagnosis not present

## 2016-10-08 DIAGNOSIS — I739 Peripheral vascular disease, unspecified: Secondary | ICD-10-CM | POA: Diagnosis not present

## 2016-10-08 DIAGNOSIS — I5030 Unspecified diastolic (congestive) heart failure: Secondary | ICD-10-CM | POA: Diagnosis not present

## 2016-10-08 DIAGNOSIS — I313 Pericardial effusion (noninflammatory): Secondary | ICD-10-CM | POA: Diagnosis not present

## 2016-10-08 DIAGNOSIS — I25119 Atherosclerotic heart disease of native coronary artery with unspecified angina pectoris: Secondary | ICD-10-CM | POA: Diagnosis not present

## 2016-10-08 DIAGNOSIS — E1159 Type 2 diabetes mellitus with other circulatory complications: Secondary | ICD-10-CM | POA: Diagnosis not present

## 2016-10-08 DIAGNOSIS — N189 Chronic kidney disease, unspecified: Secondary | ICD-10-CM | POA: Diagnosis not present

## 2016-10-10 DIAGNOSIS — E1159 Type 2 diabetes mellitus with other circulatory complications: Secondary | ICD-10-CM | POA: Diagnosis not present

## 2016-10-10 DIAGNOSIS — I4891 Unspecified atrial fibrillation: Secondary | ICD-10-CM | POA: Diagnosis not present

## 2016-10-10 DIAGNOSIS — M1990 Unspecified osteoarthritis, unspecified site: Secondary | ICD-10-CM | POA: Diagnosis not present

## 2016-10-10 DIAGNOSIS — E785 Hyperlipidemia, unspecified: Secondary | ICD-10-CM | POA: Diagnosis not present

## 2016-10-10 DIAGNOSIS — N189 Chronic kidney disease, unspecified: Secondary | ICD-10-CM | POA: Diagnosis not present

## 2016-10-10 DIAGNOSIS — M109 Gout, unspecified: Secondary | ICD-10-CM | POA: Diagnosis not present

## 2016-10-10 DIAGNOSIS — I739 Peripheral vascular disease, unspecified: Secondary | ICD-10-CM | POA: Diagnosis not present

## 2016-10-10 DIAGNOSIS — I313 Pericardial effusion (noninflammatory): Secondary | ICD-10-CM | POA: Diagnosis not present

## 2016-10-10 DIAGNOSIS — I10 Essential (primary) hypertension: Secondary | ICD-10-CM | POA: Diagnosis not present

## 2016-10-10 DIAGNOSIS — J301 Allergic rhinitis due to pollen: Secondary | ICD-10-CM | POA: Diagnosis not present

## 2016-10-10 DIAGNOSIS — I5030 Unspecified diastolic (congestive) heart failure: Secondary | ICD-10-CM | POA: Diagnosis not present

## 2016-10-10 DIAGNOSIS — N401 Enlarged prostate with lower urinary tract symptoms: Secondary | ICD-10-CM | POA: Diagnosis not present

## 2016-10-10 DIAGNOSIS — I25119 Atherosclerotic heart disease of native coronary artery with unspecified angina pectoris: Secondary | ICD-10-CM | POA: Diagnosis not present

## 2016-10-13 DIAGNOSIS — E1159 Type 2 diabetes mellitus with other circulatory complications: Secondary | ICD-10-CM | POA: Diagnosis not present

## 2016-10-13 DIAGNOSIS — I313 Pericardial effusion (noninflammatory): Secondary | ICD-10-CM | POA: Diagnosis not present

## 2016-10-13 DIAGNOSIS — I5030 Unspecified diastolic (congestive) heart failure: Secondary | ICD-10-CM | POA: Diagnosis not present

## 2016-10-13 DIAGNOSIS — N189 Chronic kidney disease, unspecified: Secondary | ICD-10-CM | POA: Diagnosis not present

## 2016-10-13 DIAGNOSIS — I25119 Atherosclerotic heart disease of native coronary artery with unspecified angina pectoris: Secondary | ICD-10-CM | POA: Diagnosis not present

## 2016-10-13 DIAGNOSIS — I739 Peripheral vascular disease, unspecified: Secondary | ICD-10-CM | POA: Diagnosis not present

## 2016-10-15 DIAGNOSIS — I25119 Atherosclerotic heart disease of native coronary artery with unspecified angina pectoris: Secondary | ICD-10-CM | POA: Diagnosis not present

## 2016-10-15 DIAGNOSIS — I739 Peripheral vascular disease, unspecified: Secondary | ICD-10-CM | POA: Diagnosis not present

## 2016-10-15 DIAGNOSIS — I5030 Unspecified diastolic (congestive) heart failure: Secondary | ICD-10-CM | POA: Diagnosis not present

## 2016-10-15 DIAGNOSIS — N189 Chronic kidney disease, unspecified: Secondary | ICD-10-CM | POA: Diagnosis not present

## 2016-10-15 DIAGNOSIS — E1159 Type 2 diabetes mellitus with other circulatory complications: Secondary | ICD-10-CM | POA: Diagnosis not present

## 2016-10-15 DIAGNOSIS — I313 Pericardial effusion (noninflammatory): Secondary | ICD-10-CM | POA: Diagnosis not present

## 2016-10-20 DIAGNOSIS — I739 Peripheral vascular disease, unspecified: Secondary | ICD-10-CM | POA: Diagnosis not present

## 2016-10-20 DIAGNOSIS — I5030 Unspecified diastolic (congestive) heart failure: Secondary | ICD-10-CM | POA: Diagnosis not present

## 2016-10-20 DIAGNOSIS — I25119 Atherosclerotic heart disease of native coronary artery with unspecified angina pectoris: Secondary | ICD-10-CM | POA: Diagnosis not present

## 2016-10-20 DIAGNOSIS — I313 Pericardial effusion (noninflammatory): Secondary | ICD-10-CM | POA: Diagnosis not present

## 2016-10-20 DIAGNOSIS — E1159 Type 2 diabetes mellitus with other circulatory complications: Secondary | ICD-10-CM | POA: Diagnosis not present

## 2016-10-20 DIAGNOSIS — N189 Chronic kidney disease, unspecified: Secondary | ICD-10-CM | POA: Diagnosis not present

## 2016-10-22 DIAGNOSIS — I739 Peripheral vascular disease, unspecified: Secondary | ICD-10-CM | POA: Diagnosis not present

## 2016-10-22 DIAGNOSIS — I313 Pericardial effusion (noninflammatory): Secondary | ICD-10-CM | POA: Diagnosis not present

## 2016-10-22 DIAGNOSIS — I25119 Atherosclerotic heart disease of native coronary artery with unspecified angina pectoris: Secondary | ICD-10-CM | POA: Diagnosis not present

## 2016-10-22 DIAGNOSIS — E1159 Type 2 diabetes mellitus with other circulatory complications: Secondary | ICD-10-CM | POA: Diagnosis not present

## 2016-10-22 DIAGNOSIS — N189 Chronic kidney disease, unspecified: Secondary | ICD-10-CM | POA: Diagnosis not present

## 2016-10-22 DIAGNOSIS — I5030 Unspecified diastolic (congestive) heart failure: Secondary | ICD-10-CM | POA: Diagnosis not present

## 2016-10-27 DIAGNOSIS — I5030 Unspecified diastolic (congestive) heart failure: Secondary | ICD-10-CM | POA: Diagnosis not present

## 2016-10-27 DIAGNOSIS — I313 Pericardial effusion (noninflammatory): Secondary | ICD-10-CM | POA: Diagnosis not present

## 2016-10-27 DIAGNOSIS — I739 Peripheral vascular disease, unspecified: Secondary | ICD-10-CM | POA: Diagnosis not present

## 2016-10-27 DIAGNOSIS — N189 Chronic kidney disease, unspecified: Secondary | ICD-10-CM | POA: Diagnosis not present

## 2016-10-27 DIAGNOSIS — E1159 Type 2 diabetes mellitus with other circulatory complications: Secondary | ICD-10-CM | POA: Diagnosis not present

## 2016-10-27 DIAGNOSIS — I25119 Atherosclerotic heart disease of native coronary artery with unspecified angina pectoris: Secondary | ICD-10-CM | POA: Diagnosis not present

## 2016-10-29 DIAGNOSIS — I25119 Atherosclerotic heart disease of native coronary artery with unspecified angina pectoris: Secondary | ICD-10-CM | POA: Diagnosis not present

## 2016-10-29 DIAGNOSIS — E1159 Type 2 diabetes mellitus with other circulatory complications: Secondary | ICD-10-CM | POA: Diagnosis not present

## 2016-10-29 DIAGNOSIS — I5030 Unspecified diastolic (congestive) heart failure: Secondary | ICD-10-CM | POA: Diagnosis not present

## 2016-10-29 DIAGNOSIS — N189 Chronic kidney disease, unspecified: Secondary | ICD-10-CM | POA: Diagnosis not present

## 2016-10-29 DIAGNOSIS — I739 Peripheral vascular disease, unspecified: Secondary | ICD-10-CM | POA: Diagnosis not present

## 2016-10-29 DIAGNOSIS — I313 Pericardial effusion (noninflammatory): Secondary | ICD-10-CM | POA: Diagnosis not present

## 2016-11-03 DIAGNOSIS — I25119 Atherosclerotic heart disease of native coronary artery with unspecified angina pectoris: Secondary | ICD-10-CM | POA: Diagnosis not present

## 2016-11-03 DIAGNOSIS — I313 Pericardial effusion (noninflammatory): Secondary | ICD-10-CM | POA: Diagnosis not present

## 2016-11-03 DIAGNOSIS — E1159 Type 2 diabetes mellitus with other circulatory complications: Secondary | ICD-10-CM | POA: Diagnosis not present

## 2016-11-03 DIAGNOSIS — I739 Peripheral vascular disease, unspecified: Secondary | ICD-10-CM | POA: Diagnosis not present

## 2016-11-03 DIAGNOSIS — N189 Chronic kidney disease, unspecified: Secondary | ICD-10-CM | POA: Diagnosis not present

## 2016-11-03 DIAGNOSIS — I5030 Unspecified diastolic (congestive) heart failure: Secondary | ICD-10-CM | POA: Diagnosis not present

## 2016-11-05 DIAGNOSIS — I313 Pericardial effusion (noninflammatory): Secondary | ICD-10-CM | POA: Diagnosis not present

## 2016-11-05 DIAGNOSIS — I25119 Atherosclerotic heart disease of native coronary artery with unspecified angina pectoris: Secondary | ICD-10-CM | POA: Diagnosis not present

## 2016-11-05 DIAGNOSIS — N189 Chronic kidney disease, unspecified: Secondary | ICD-10-CM | POA: Diagnosis not present

## 2016-11-05 DIAGNOSIS — E1159 Type 2 diabetes mellitus with other circulatory complications: Secondary | ICD-10-CM | POA: Diagnosis not present

## 2016-11-05 DIAGNOSIS — I5030 Unspecified diastolic (congestive) heart failure: Secondary | ICD-10-CM | POA: Diagnosis not present

## 2016-11-05 DIAGNOSIS — I739 Peripheral vascular disease, unspecified: Secondary | ICD-10-CM | POA: Diagnosis not present

## 2016-11-09 DIAGNOSIS — I5030 Unspecified diastolic (congestive) heart failure: Secondary | ICD-10-CM | POA: Diagnosis not present

## 2016-11-09 DIAGNOSIS — I10 Essential (primary) hypertension: Secondary | ICD-10-CM | POA: Diagnosis not present

## 2016-11-09 DIAGNOSIS — E785 Hyperlipidemia, unspecified: Secondary | ICD-10-CM | POA: Diagnosis not present

## 2016-11-09 DIAGNOSIS — I313 Pericardial effusion (noninflammatory): Secondary | ICD-10-CM | POA: Diagnosis not present

## 2016-11-09 DIAGNOSIS — M1990 Unspecified osteoarthritis, unspecified site: Secondary | ICD-10-CM | POA: Diagnosis not present

## 2016-11-09 DIAGNOSIS — N189 Chronic kidney disease, unspecified: Secondary | ICD-10-CM | POA: Diagnosis not present

## 2016-11-09 DIAGNOSIS — I739 Peripheral vascular disease, unspecified: Secondary | ICD-10-CM | POA: Diagnosis not present

## 2016-11-09 DIAGNOSIS — I25119 Atherosclerotic heart disease of native coronary artery with unspecified angina pectoris: Secondary | ICD-10-CM | POA: Diagnosis not present

## 2016-11-09 DIAGNOSIS — E1159 Type 2 diabetes mellitus with other circulatory complications: Secondary | ICD-10-CM | POA: Diagnosis not present

## 2016-11-09 DIAGNOSIS — N401 Enlarged prostate with lower urinary tract symptoms: Secondary | ICD-10-CM | POA: Diagnosis not present

## 2016-11-09 DIAGNOSIS — I4891 Unspecified atrial fibrillation: Secondary | ICD-10-CM | POA: Diagnosis not present

## 2016-11-09 DIAGNOSIS — M109 Gout, unspecified: Secondary | ICD-10-CM | POA: Diagnosis not present

## 2016-11-09 DIAGNOSIS — J301 Allergic rhinitis due to pollen: Secondary | ICD-10-CM | POA: Diagnosis not present

## 2016-11-10 DIAGNOSIS — N189 Chronic kidney disease, unspecified: Secondary | ICD-10-CM | POA: Diagnosis not present

## 2016-11-10 DIAGNOSIS — I25119 Atherosclerotic heart disease of native coronary artery with unspecified angina pectoris: Secondary | ICD-10-CM | POA: Diagnosis not present

## 2016-11-10 DIAGNOSIS — I313 Pericardial effusion (noninflammatory): Secondary | ICD-10-CM | POA: Diagnosis not present

## 2016-11-10 DIAGNOSIS — I5030 Unspecified diastolic (congestive) heart failure: Secondary | ICD-10-CM | POA: Diagnosis not present

## 2016-11-10 DIAGNOSIS — E1159 Type 2 diabetes mellitus with other circulatory complications: Secondary | ICD-10-CM | POA: Diagnosis not present

## 2016-11-10 DIAGNOSIS — I739 Peripheral vascular disease, unspecified: Secondary | ICD-10-CM | POA: Diagnosis not present

## 2016-11-12 DIAGNOSIS — I739 Peripheral vascular disease, unspecified: Secondary | ICD-10-CM | POA: Diagnosis not present

## 2016-11-12 DIAGNOSIS — I5030 Unspecified diastolic (congestive) heart failure: Secondary | ICD-10-CM | POA: Diagnosis not present

## 2016-11-12 DIAGNOSIS — I25119 Atherosclerotic heart disease of native coronary artery with unspecified angina pectoris: Secondary | ICD-10-CM | POA: Diagnosis not present

## 2016-11-12 DIAGNOSIS — I313 Pericardial effusion (noninflammatory): Secondary | ICD-10-CM | POA: Diagnosis not present

## 2016-11-12 DIAGNOSIS — E1159 Type 2 diabetes mellitus with other circulatory complications: Secondary | ICD-10-CM | POA: Diagnosis not present

## 2016-11-12 DIAGNOSIS — N189 Chronic kidney disease, unspecified: Secondary | ICD-10-CM | POA: Diagnosis not present

## 2016-11-17 DIAGNOSIS — I313 Pericardial effusion (noninflammatory): Secondary | ICD-10-CM | POA: Diagnosis not present

## 2016-11-17 DIAGNOSIS — E1159 Type 2 diabetes mellitus with other circulatory complications: Secondary | ICD-10-CM | POA: Diagnosis not present

## 2016-11-17 DIAGNOSIS — N189 Chronic kidney disease, unspecified: Secondary | ICD-10-CM | POA: Diagnosis not present

## 2016-11-17 DIAGNOSIS — I5030 Unspecified diastolic (congestive) heart failure: Secondary | ICD-10-CM | POA: Diagnosis not present

## 2016-11-17 DIAGNOSIS — I739 Peripheral vascular disease, unspecified: Secondary | ICD-10-CM | POA: Diagnosis not present

## 2016-11-17 DIAGNOSIS — I25119 Atherosclerotic heart disease of native coronary artery with unspecified angina pectoris: Secondary | ICD-10-CM | POA: Diagnosis not present

## 2016-11-19 DIAGNOSIS — N189 Chronic kidney disease, unspecified: Secondary | ICD-10-CM | POA: Diagnosis not present

## 2016-11-19 DIAGNOSIS — I313 Pericardial effusion (noninflammatory): Secondary | ICD-10-CM | POA: Diagnosis not present

## 2016-11-19 DIAGNOSIS — E1159 Type 2 diabetes mellitus with other circulatory complications: Secondary | ICD-10-CM | POA: Diagnosis not present

## 2016-11-19 DIAGNOSIS — I25119 Atherosclerotic heart disease of native coronary artery with unspecified angina pectoris: Secondary | ICD-10-CM | POA: Diagnosis not present

## 2016-11-19 DIAGNOSIS — I739 Peripheral vascular disease, unspecified: Secondary | ICD-10-CM | POA: Diagnosis not present

## 2016-11-19 DIAGNOSIS — I5030 Unspecified diastolic (congestive) heart failure: Secondary | ICD-10-CM | POA: Diagnosis not present

## 2016-11-24 DIAGNOSIS — E1159 Type 2 diabetes mellitus with other circulatory complications: Secondary | ICD-10-CM | POA: Diagnosis not present

## 2016-11-24 DIAGNOSIS — I5030 Unspecified diastolic (congestive) heart failure: Secondary | ICD-10-CM | POA: Diagnosis not present

## 2016-11-24 DIAGNOSIS — I739 Peripheral vascular disease, unspecified: Secondary | ICD-10-CM | POA: Diagnosis not present

## 2016-11-24 DIAGNOSIS — N189 Chronic kidney disease, unspecified: Secondary | ICD-10-CM | POA: Diagnosis not present

## 2016-11-24 DIAGNOSIS — I313 Pericardial effusion (noninflammatory): Secondary | ICD-10-CM | POA: Diagnosis not present

## 2016-11-24 DIAGNOSIS — I25119 Atherosclerotic heart disease of native coronary artery with unspecified angina pectoris: Secondary | ICD-10-CM | POA: Diagnosis not present

## 2016-11-26 DIAGNOSIS — I739 Peripheral vascular disease, unspecified: Secondary | ICD-10-CM | POA: Diagnosis not present

## 2016-11-26 DIAGNOSIS — N189 Chronic kidney disease, unspecified: Secondary | ICD-10-CM | POA: Diagnosis not present

## 2016-11-26 DIAGNOSIS — I5030 Unspecified diastolic (congestive) heart failure: Secondary | ICD-10-CM | POA: Diagnosis not present

## 2016-11-26 DIAGNOSIS — I313 Pericardial effusion (noninflammatory): Secondary | ICD-10-CM | POA: Diagnosis not present

## 2016-11-26 DIAGNOSIS — E1159 Type 2 diabetes mellitus with other circulatory complications: Secondary | ICD-10-CM | POA: Diagnosis not present

## 2016-11-26 DIAGNOSIS — I25119 Atherosclerotic heart disease of native coronary artery with unspecified angina pectoris: Secondary | ICD-10-CM | POA: Diagnosis not present

## 2016-12-01 DIAGNOSIS — I25119 Atherosclerotic heart disease of native coronary artery with unspecified angina pectoris: Secondary | ICD-10-CM | POA: Diagnosis not present

## 2016-12-01 DIAGNOSIS — N189 Chronic kidney disease, unspecified: Secondary | ICD-10-CM | POA: Diagnosis not present

## 2016-12-01 DIAGNOSIS — E1159 Type 2 diabetes mellitus with other circulatory complications: Secondary | ICD-10-CM | POA: Diagnosis not present

## 2016-12-01 DIAGNOSIS — I313 Pericardial effusion (noninflammatory): Secondary | ICD-10-CM | POA: Diagnosis not present

## 2016-12-01 DIAGNOSIS — I5030 Unspecified diastolic (congestive) heart failure: Secondary | ICD-10-CM | POA: Diagnosis not present

## 2016-12-01 DIAGNOSIS — I739 Peripheral vascular disease, unspecified: Secondary | ICD-10-CM | POA: Diagnosis not present

## 2016-12-03 DIAGNOSIS — E1159 Type 2 diabetes mellitus with other circulatory complications: Secondary | ICD-10-CM | POA: Diagnosis not present

## 2016-12-03 DIAGNOSIS — N189 Chronic kidney disease, unspecified: Secondary | ICD-10-CM | POA: Diagnosis not present

## 2016-12-03 DIAGNOSIS — I313 Pericardial effusion (noninflammatory): Secondary | ICD-10-CM | POA: Diagnosis not present

## 2016-12-03 DIAGNOSIS — I5030 Unspecified diastolic (congestive) heart failure: Secondary | ICD-10-CM | POA: Diagnosis not present

## 2016-12-03 DIAGNOSIS — I25119 Atherosclerotic heart disease of native coronary artery with unspecified angina pectoris: Secondary | ICD-10-CM | POA: Diagnosis not present

## 2016-12-03 DIAGNOSIS — I739 Peripheral vascular disease, unspecified: Secondary | ICD-10-CM | POA: Diagnosis not present

## 2016-12-08 DIAGNOSIS — E1159 Type 2 diabetes mellitus with other circulatory complications: Secondary | ICD-10-CM | POA: Diagnosis not present

## 2016-12-08 DIAGNOSIS — I25119 Atherosclerotic heart disease of native coronary artery with unspecified angina pectoris: Secondary | ICD-10-CM | POA: Diagnosis not present

## 2016-12-08 DIAGNOSIS — I5030 Unspecified diastolic (congestive) heart failure: Secondary | ICD-10-CM | POA: Diagnosis not present

## 2016-12-08 DIAGNOSIS — I739 Peripheral vascular disease, unspecified: Secondary | ICD-10-CM | POA: Diagnosis not present

## 2016-12-08 DIAGNOSIS — I313 Pericardial effusion (noninflammatory): Secondary | ICD-10-CM | POA: Diagnosis not present

## 2016-12-08 DIAGNOSIS — N189 Chronic kidney disease, unspecified: Secondary | ICD-10-CM | POA: Diagnosis not present

## 2016-12-10 DIAGNOSIS — N401 Enlarged prostate with lower urinary tract symptoms: Secondary | ICD-10-CM | POA: Diagnosis not present

## 2016-12-10 DIAGNOSIS — I10 Essential (primary) hypertension: Secondary | ICD-10-CM | POA: Diagnosis not present

## 2016-12-10 DIAGNOSIS — I5030 Unspecified diastolic (congestive) heart failure: Secondary | ICD-10-CM | POA: Diagnosis not present

## 2016-12-10 DIAGNOSIS — M109 Gout, unspecified: Secondary | ICD-10-CM | POA: Diagnosis not present

## 2016-12-10 DIAGNOSIS — I313 Pericardial effusion (noninflammatory): Secondary | ICD-10-CM | POA: Diagnosis not present

## 2016-12-10 DIAGNOSIS — I739 Peripheral vascular disease, unspecified: Secondary | ICD-10-CM | POA: Diagnosis not present

## 2016-12-10 DIAGNOSIS — I4891 Unspecified atrial fibrillation: Secondary | ICD-10-CM | POA: Diagnosis not present

## 2016-12-10 DIAGNOSIS — I25119 Atherosclerotic heart disease of native coronary artery with unspecified angina pectoris: Secondary | ICD-10-CM | POA: Diagnosis not present

## 2016-12-10 DIAGNOSIS — E785 Hyperlipidemia, unspecified: Secondary | ICD-10-CM | POA: Diagnosis not present

## 2016-12-10 DIAGNOSIS — J301 Allergic rhinitis due to pollen: Secondary | ICD-10-CM | POA: Diagnosis not present

## 2016-12-10 DIAGNOSIS — N189 Chronic kidney disease, unspecified: Secondary | ICD-10-CM | POA: Diagnosis not present

## 2016-12-10 DIAGNOSIS — E1159 Type 2 diabetes mellitus with other circulatory complications: Secondary | ICD-10-CM | POA: Diagnosis not present

## 2016-12-10 DIAGNOSIS — M1990 Unspecified osteoarthritis, unspecified site: Secondary | ICD-10-CM | POA: Diagnosis not present

## 2016-12-15 DIAGNOSIS — N189 Chronic kidney disease, unspecified: Secondary | ICD-10-CM | POA: Diagnosis not present

## 2016-12-15 DIAGNOSIS — I25119 Atherosclerotic heart disease of native coronary artery with unspecified angina pectoris: Secondary | ICD-10-CM | POA: Diagnosis not present

## 2016-12-15 DIAGNOSIS — E1159 Type 2 diabetes mellitus with other circulatory complications: Secondary | ICD-10-CM | POA: Diagnosis not present

## 2016-12-15 DIAGNOSIS — I739 Peripheral vascular disease, unspecified: Secondary | ICD-10-CM | POA: Diagnosis not present

## 2016-12-15 DIAGNOSIS — I313 Pericardial effusion (noninflammatory): Secondary | ICD-10-CM | POA: Diagnosis not present

## 2016-12-15 DIAGNOSIS — I5030 Unspecified diastolic (congestive) heart failure: Secondary | ICD-10-CM | POA: Diagnosis not present

## 2016-12-17 DIAGNOSIS — I739 Peripheral vascular disease, unspecified: Secondary | ICD-10-CM | POA: Diagnosis not present

## 2016-12-17 DIAGNOSIS — I25119 Atherosclerotic heart disease of native coronary artery with unspecified angina pectoris: Secondary | ICD-10-CM | POA: Diagnosis not present

## 2016-12-17 DIAGNOSIS — E1159 Type 2 diabetes mellitus with other circulatory complications: Secondary | ICD-10-CM | POA: Diagnosis not present

## 2016-12-17 DIAGNOSIS — I313 Pericardial effusion (noninflammatory): Secondary | ICD-10-CM | POA: Diagnosis not present

## 2016-12-17 DIAGNOSIS — I5030 Unspecified diastolic (congestive) heart failure: Secondary | ICD-10-CM | POA: Diagnosis not present

## 2016-12-17 DIAGNOSIS — N189 Chronic kidney disease, unspecified: Secondary | ICD-10-CM | POA: Diagnosis not present

## 2016-12-22 DIAGNOSIS — I5030 Unspecified diastolic (congestive) heart failure: Secondary | ICD-10-CM | POA: Diagnosis not present

## 2016-12-22 DIAGNOSIS — I739 Peripheral vascular disease, unspecified: Secondary | ICD-10-CM | POA: Diagnosis not present

## 2016-12-22 DIAGNOSIS — E1159 Type 2 diabetes mellitus with other circulatory complications: Secondary | ICD-10-CM | POA: Diagnosis not present

## 2016-12-22 DIAGNOSIS — I25119 Atherosclerotic heart disease of native coronary artery with unspecified angina pectoris: Secondary | ICD-10-CM | POA: Diagnosis not present

## 2016-12-22 DIAGNOSIS — I313 Pericardial effusion (noninflammatory): Secondary | ICD-10-CM | POA: Diagnosis not present

## 2016-12-22 DIAGNOSIS — N189 Chronic kidney disease, unspecified: Secondary | ICD-10-CM | POA: Diagnosis not present

## 2016-12-24 DIAGNOSIS — N189 Chronic kidney disease, unspecified: Secondary | ICD-10-CM | POA: Diagnosis not present

## 2016-12-24 DIAGNOSIS — E1159 Type 2 diabetes mellitus with other circulatory complications: Secondary | ICD-10-CM | POA: Diagnosis not present

## 2016-12-24 DIAGNOSIS — I5030 Unspecified diastolic (congestive) heart failure: Secondary | ICD-10-CM | POA: Diagnosis not present

## 2016-12-24 DIAGNOSIS — I313 Pericardial effusion (noninflammatory): Secondary | ICD-10-CM | POA: Diagnosis not present

## 2016-12-24 DIAGNOSIS — I25119 Atherosclerotic heart disease of native coronary artery with unspecified angina pectoris: Secondary | ICD-10-CM | POA: Diagnosis not present

## 2016-12-24 DIAGNOSIS — I739 Peripheral vascular disease, unspecified: Secondary | ICD-10-CM | POA: Diagnosis not present

## 2016-12-29 DIAGNOSIS — I739 Peripheral vascular disease, unspecified: Secondary | ICD-10-CM | POA: Diagnosis not present

## 2016-12-29 DIAGNOSIS — I5030 Unspecified diastolic (congestive) heart failure: Secondary | ICD-10-CM | POA: Diagnosis not present

## 2016-12-29 DIAGNOSIS — E1159 Type 2 diabetes mellitus with other circulatory complications: Secondary | ICD-10-CM | POA: Diagnosis not present

## 2016-12-29 DIAGNOSIS — I25119 Atherosclerotic heart disease of native coronary artery with unspecified angina pectoris: Secondary | ICD-10-CM | POA: Diagnosis not present

## 2016-12-29 DIAGNOSIS — N189 Chronic kidney disease, unspecified: Secondary | ICD-10-CM | POA: Diagnosis not present

## 2016-12-29 DIAGNOSIS — I313 Pericardial effusion (noninflammatory): Secondary | ICD-10-CM | POA: Diagnosis not present

## 2017-01-01 DIAGNOSIS — I5030 Unspecified diastolic (congestive) heart failure: Secondary | ICD-10-CM | POA: Diagnosis not present

## 2017-01-01 DIAGNOSIS — I739 Peripheral vascular disease, unspecified: Secondary | ICD-10-CM | POA: Diagnosis not present

## 2017-01-01 DIAGNOSIS — I313 Pericardial effusion (noninflammatory): Secondary | ICD-10-CM | POA: Diagnosis not present

## 2017-01-01 DIAGNOSIS — E1159 Type 2 diabetes mellitus with other circulatory complications: Secondary | ICD-10-CM | POA: Diagnosis not present

## 2017-01-01 DIAGNOSIS — I25119 Atherosclerotic heart disease of native coronary artery with unspecified angina pectoris: Secondary | ICD-10-CM | POA: Diagnosis not present

## 2017-01-01 DIAGNOSIS — N189 Chronic kidney disease, unspecified: Secondary | ICD-10-CM | POA: Diagnosis not present

## 2017-01-02 DIAGNOSIS — I739 Peripheral vascular disease, unspecified: Secondary | ICD-10-CM | POA: Diagnosis not present

## 2017-01-02 DIAGNOSIS — I25119 Atherosclerotic heart disease of native coronary artery with unspecified angina pectoris: Secondary | ICD-10-CM | POA: Diagnosis not present

## 2017-01-02 DIAGNOSIS — I313 Pericardial effusion (noninflammatory): Secondary | ICD-10-CM | POA: Diagnosis not present

## 2017-01-02 DIAGNOSIS — I5030 Unspecified diastolic (congestive) heart failure: Secondary | ICD-10-CM | POA: Diagnosis not present

## 2017-01-02 DIAGNOSIS — N189 Chronic kidney disease, unspecified: Secondary | ICD-10-CM | POA: Diagnosis not present

## 2017-01-02 DIAGNOSIS — E1159 Type 2 diabetes mellitus with other circulatory complications: Secondary | ICD-10-CM | POA: Diagnosis not present

## 2017-01-05 DIAGNOSIS — E1159 Type 2 diabetes mellitus with other circulatory complications: Secondary | ICD-10-CM | POA: Diagnosis not present

## 2017-01-05 DIAGNOSIS — I25119 Atherosclerotic heart disease of native coronary artery with unspecified angina pectoris: Secondary | ICD-10-CM | POA: Diagnosis not present

## 2017-01-05 DIAGNOSIS — I739 Peripheral vascular disease, unspecified: Secondary | ICD-10-CM | POA: Diagnosis not present

## 2017-01-05 DIAGNOSIS — I5030 Unspecified diastolic (congestive) heart failure: Secondary | ICD-10-CM | POA: Diagnosis not present

## 2017-01-05 DIAGNOSIS — N189 Chronic kidney disease, unspecified: Secondary | ICD-10-CM | POA: Diagnosis not present

## 2017-01-05 DIAGNOSIS — I313 Pericardial effusion (noninflammatory): Secondary | ICD-10-CM | POA: Diagnosis not present

## 2017-01-07 DIAGNOSIS — I5030 Unspecified diastolic (congestive) heart failure: Secondary | ICD-10-CM | POA: Diagnosis not present

## 2017-01-07 DIAGNOSIS — I25119 Atherosclerotic heart disease of native coronary artery with unspecified angina pectoris: Secondary | ICD-10-CM | POA: Diagnosis not present

## 2017-01-07 DIAGNOSIS — I739 Peripheral vascular disease, unspecified: Secondary | ICD-10-CM | POA: Diagnosis not present

## 2017-01-07 DIAGNOSIS — N189 Chronic kidney disease, unspecified: Secondary | ICD-10-CM | POA: Diagnosis not present

## 2017-01-07 DIAGNOSIS — E1159 Type 2 diabetes mellitus with other circulatory complications: Secondary | ICD-10-CM | POA: Diagnosis not present

## 2017-01-07 DIAGNOSIS — I313 Pericardial effusion (noninflammatory): Secondary | ICD-10-CM | POA: Diagnosis not present

## 2017-01-09 DIAGNOSIS — I5030 Unspecified diastolic (congestive) heart failure: Secondary | ICD-10-CM | POA: Diagnosis not present

## 2017-01-09 DIAGNOSIS — E1159 Type 2 diabetes mellitus with other circulatory complications: Secondary | ICD-10-CM | POA: Diagnosis not present

## 2017-01-09 DIAGNOSIS — M1990 Unspecified osteoarthritis, unspecified site: Secondary | ICD-10-CM | POA: Diagnosis not present

## 2017-01-09 DIAGNOSIS — I10 Essential (primary) hypertension: Secondary | ICD-10-CM | POA: Diagnosis not present

## 2017-01-09 DIAGNOSIS — I313 Pericardial effusion (noninflammatory): Secondary | ICD-10-CM | POA: Diagnosis not present

## 2017-01-09 DIAGNOSIS — I25119 Atherosclerotic heart disease of native coronary artery with unspecified angina pectoris: Secondary | ICD-10-CM | POA: Diagnosis not present

## 2017-01-09 DIAGNOSIS — N189 Chronic kidney disease, unspecified: Secondary | ICD-10-CM | POA: Diagnosis not present

## 2017-01-09 DIAGNOSIS — I4891 Unspecified atrial fibrillation: Secondary | ICD-10-CM | POA: Diagnosis not present

## 2017-01-09 DIAGNOSIS — N401 Enlarged prostate with lower urinary tract symptoms: Secondary | ICD-10-CM | POA: Diagnosis not present

## 2017-01-09 DIAGNOSIS — I739 Peripheral vascular disease, unspecified: Secondary | ICD-10-CM | POA: Diagnosis not present

## 2017-01-09 DIAGNOSIS — E785 Hyperlipidemia, unspecified: Secondary | ICD-10-CM | POA: Diagnosis not present

## 2017-01-09 DIAGNOSIS — J301 Allergic rhinitis due to pollen: Secondary | ICD-10-CM | POA: Diagnosis not present

## 2017-01-09 DIAGNOSIS — M109 Gout, unspecified: Secondary | ICD-10-CM | POA: Diagnosis not present

## 2017-01-10 DIAGNOSIS — I25119 Atherosclerotic heart disease of native coronary artery with unspecified angina pectoris: Secondary | ICD-10-CM | POA: Diagnosis not present

## 2017-01-10 DIAGNOSIS — I5030 Unspecified diastolic (congestive) heart failure: Secondary | ICD-10-CM | POA: Diagnosis not present

## 2017-01-10 DIAGNOSIS — E1159 Type 2 diabetes mellitus with other circulatory complications: Secondary | ICD-10-CM | POA: Diagnosis not present

## 2017-01-10 DIAGNOSIS — I313 Pericardial effusion (noninflammatory): Secondary | ICD-10-CM | POA: Diagnosis not present

## 2017-01-10 DIAGNOSIS — I739 Peripheral vascular disease, unspecified: Secondary | ICD-10-CM | POA: Diagnosis not present

## 2017-01-10 DIAGNOSIS — N189 Chronic kidney disease, unspecified: Secondary | ICD-10-CM | POA: Diagnosis not present

## 2017-01-12 DIAGNOSIS — I739 Peripheral vascular disease, unspecified: Secondary | ICD-10-CM | POA: Diagnosis not present

## 2017-01-12 DIAGNOSIS — I5030 Unspecified diastolic (congestive) heart failure: Secondary | ICD-10-CM | POA: Diagnosis not present

## 2017-01-12 DIAGNOSIS — E1159 Type 2 diabetes mellitus with other circulatory complications: Secondary | ICD-10-CM | POA: Diagnosis not present

## 2017-01-12 DIAGNOSIS — I313 Pericardial effusion (noninflammatory): Secondary | ICD-10-CM | POA: Diagnosis not present

## 2017-01-12 DIAGNOSIS — N189 Chronic kidney disease, unspecified: Secondary | ICD-10-CM | POA: Diagnosis not present

## 2017-01-12 DIAGNOSIS — I25119 Atherosclerotic heart disease of native coronary artery with unspecified angina pectoris: Secondary | ICD-10-CM | POA: Diagnosis not present

## 2017-01-14 DIAGNOSIS — I739 Peripheral vascular disease, unspecified: Secondary | ICD-10-CM | POA: Diagnosis not present

## 2017-01-14 DIAGNOSIS — N189 Chronic kidney disease, unspecified: Secondary | ICD-10-CM | POA: Diagnosis not present

## 2017-01-14 DIAGNOSIS — I313 Pericardial effusion (noninflammatory): Secondary | ICD-10-CM | POA: Diagnosis not present

## 2017-01-14 DIAGNOSIS — I25119 Atherosclerotic heart disease of native coronary artery with unspecified angina pectoris: Secondary | ICD-10-CM | POA: Diagnosis not present

## 2017-01-14 DIAGNOSIS — E1159 Type 2 diabetes mellitus with other circulatory complications: Secondary | ICD-10-CM | POA: Diagnosis not present

## 2017-01-14 DIAGNOSIS — I5030 Unspecified diastolic (congestive) heart failure: Secondary | ICD-10-CM | POA: Diagnosis not present

## 2017-01-21 DIAGNOSIS — I5030 Unspecified diastolic (congestive) heart failure: Secondary | ICD-10-CM | POA: Diagnosis not present

## 2017-01-21 DIAGNOSIS — N189 Chronic kidney disease, unspecified: Secondary | ICD-10-CM | POA: Diagnosis not present

## 2017-01-21 DIAGNOSIS — E1159 Type 2 diabetes mellitus with other circulatory complications: Secondary | ICD-10-CM | POA: Diagnosis not present

## 2017-01-21 DIAGNOSIS — I313 Pericardial effusion (noninflammatory): Secondary | ICD-10-CM | POA: Diagnosis not present

## 2017-01-21 DIAGNOSIS — I739 Peripheral vascular disease, unspecified: Secondary | ICD-10-CM | POA: Diagnosis not present

## 2017-01-21 DIAGNOSIS — I25119 Atherosclerotic heart disease of native coronary artery with unspecified angina pectoris: Secondary | ICD-10-CM | POA: Diagnosis not present

## 2017-01-25 DIAGNOSIS — E1159 Type 2 diabetes mellitus with other circulatory complications: Secondary | ICD-10-CM | POA: Diagnosis not present

## 2017-01-25 DIAGNOSIS — I313 Pericardial effusion (noninflammatory): Secondary | ICD-10-CM | POA: Diagnosis not present

## 2017-01-25 DIAGNOSIS — N189 Chronic kidney disease, unspecified: Secondary | ICD-10-CM | POA: Diagnosis not present

## 2017-01-25 DIAGNOSIS — I25119 Atherosclerotic heart disease of native coronary artery with unspecified angina pectoris: Secondary | ICD-10-CM | POA: Diagnosis not present

## 2017-01-25 DIAGNOSIS — I5030 Unspecified diastolic (congestive) heart failure: Secondary | ICD-10-CM | POA: Diagnosis not present

## 2017-01-25 DIAGNOSIS — I739 Peripheral vascular disease, unspecified: Secondary | ICD-10-CM | POA: Diagnosis not present

## 2017-01-28 DIAGNOSIS — E1159 Type 2 diabetes mellitus with other circulatory complications: Secondary | ICD-10-CM | POA: Diagnosis not present

## 2017-01-28 DIAGNOSIS — I5030 Unspecified diastolic (congestive) heart failure: Secondary | ICD-10-CM | POA: Diagnosis not present

## 2017-01-28 DIAGNOSIS — I25119 Atherosclerotic heart disease of native coronary artery with unspecified angina pectoris: Secondary | ICD-10-CM | POA: Diagnosis not present

## 2017-01-28 DIAGNOSIS — I313 Pericardial effusion (noninflammatory): Secondary | ICD-10-CM | POA: Diagnosis not present

## 2017-01-28 DIAGNOSIS — I739 Peripheral vascular disease, unspecified: Secondary | ICD-10-CM | POA: Diagnosis not present

## 2017-01-28 DIAGNOSIS — N189 Chronic kidney disease, unspecified: Secondary | ICD-10-CM | POA: Diagnosis not present

## 2017-02-01 DIAGNOSIS — I739 Peripheral vascular disease, unspecified: Secondary | ICD-10-CM | POA: Diagnosis not present

## 2017-02-01 DIAGNOSIS — N189 Chronic kidney disease, unspecified: Secondary | ICD-10-CM | POA: Diagnosis not present

## 2017-02-01 DIAGNOSIS — I25119 Atherosclerotic heart disease of native coronary artery with unspecified angina pectoris: Secondary | ICD-10-CM | POA: Diagnosis not present

## 2017-02-01 DIAGNOSIS — I5030 Unspecified diastolic (congestive) heart failure: Secondary | ICD-10-CM | POA: Diagnosis not present

## 2017-02-01 DIAGNOSIS — E1159 Type 2 diabetes mellitus with other circulatory complications: Secondary | ICD-10-CM | POA: Diagnosis not present

## 2017-02-01 DIAGNOSIS — I313 Pericardial effusion (noninflammatory): Secondary | ICD-10-CM | POA: Diagnosis not present

## 2017-02-04 DIAGNOSIS — N189 Chronic kidney disease, unspecified: Secondary | ICD-10-CM | POA: Diagnosis not present

## 2017-02-04 DIAGNOSIS — I739 Peripheral vascular disease, unspecified: Secondary | ICD-10-CM | POA: Diagnosis not present

## 2017-02-04 DIAGNOSIS — I5030 Unspecified diastolic (congestive) heart failure: Secondary | ICD-10-CM | POA: Diagnosis not present

## 2017-02-04 DIAGNOSIS — I313 Pericardial effusion (noninflammatory): Secondary | ICD-10-CM | POA: Diagnosis not present

## 2017-02-04 DIAGNOSIS — I25119 Atherosclerotic heart disease of native coronary artery with unspecified angina pectoris: Secondary | ICD-10-CM | POA: Diagnosis not present

## 2017-02-04 DIAGNOSIS — E1159 Type 2 diabetes mellitus with other circulatory complications: Secondary | ICD-10-CM | POA: Diagnosis not present

## 2017-02-05 DIAGNOSIS — N189 Chronic kidney disease, unspecified: Secondary | ICD-10-CM | POA: Diagnosis not present

## 2017-02-05 DIAGNOSIS — I739 Peripheral vascular disease, unspecified: Secondary | ICD-10-CM | POA: Diagnosis not present

## 2017-02-05 DIAGNOSIS — I5030 Unspecified diastolic (congestive) heart failure: Secondary | ICD-10-CM | POA: Diagnosis not present

## 2017-02-05 DIAGNOSIS — I25119 Atherosclerotic heart disease of native coronary artery with unspecified angina pectoris: Secondary | ICD-10-CM | POA: Diagnosis not present

## 2017-02-05 DIAGNOSIS — E1159 Type 2 diabetes mellitus with other circulatory complications: Secondary | ICD-10-CM | POA: Diagnosis not present

## 2017-02-05 DIAGNOSIS — I313 Pericardial effusion (noninflammatory): Secondary | ICD-10-CM | POA: Diagnosis not present

## 2017-02-08 DIAGNOSIS — I739 Peripheral vascular disease, unspecified: Secondary | ICD-10-CM | POA: Diagnosis not present

## 2017-02-08 DIAGNOSIS — I25119 Atherosclerotic heart disease of native coronary artery with unspecified angina pectoris: Secondary | ICD-10-CM | POA: Diagnosis not present

## 2017-02-08 DIAGNOSIS — N189 Chronic kidney disease, unspecified: Secondary | ICD-10-CM | POA: Diagnosis not present

## 2017-02-08 DIAGNOSIS — I5030 Unspecified diastolic (congestive) heart failure: Secondary | ICD-10-CM | POA: Diagnosis not present

## 2017-02-08 DIAGNOSIS — I313 Pericardial effusion (noninflammatory): Secondary | ICD-10-CM | POA: Diagnosis not present

## 2017-02-08 DIAGNOSIS — E1159 Type 2 diabetes mellitus with other circulatory complications: Secondary | ICD-10-CM | POA: Diagnosis not present

## 2017-02-09 DIAGNOSIS — J301 Allergic rhinitis due to pollen: Secondary | ICD-10-CM | POA: Diagnosis not present

## 2017-02-09 DIAGNOSIS — N401 Enlarged prostate with lower urinary tract symptoms: Secondary | ICD-10-CM | POA: Diagnosis not present

## 2017-02-09 DIAGNOSIS — I5030 Unspecified diastolic (congestive) heart failure: Secondary | ICD-10-CM | POA: Diagnosis not present

## 2017-02-09 DIAGNOSIS — M109 Gout, unspecified: Secondary | ICD-10-CM | POA: Diagnosis not present

## 2017-02-09 DIAGNOSIS — I4891 Unspecified atrial fibrillation: Secondary | ICD-10-CM | POA: Diagnosis not present

## 2017-02-09 DIAGNOSIS — M1990 Unspecified osteoarthritis, unspecified site: Secondary | ICD-10-CM | POA: Diagnosis not present

## 2017-02-09 DIAGNOSIS — I313 Pericardial effusion (noninflammatory): Secondary | ICD-10-CM | POA: Diagnosis not present

## 2017-02-09 DIAGNOSIS — I25119 Atherosclerotic heart disease of native coronary artery with unspecified angina pectoris: Secondary | ICD-10-CM | POA: Diagnosis not present

## 2017-02-09 DIAGNOSIS — N189 Chronic kidney disease, unspecified: Secondary | ICD-10-CM | POA: Diagnosis not present

## 2017-02-09 DIAGNOSIS — I739 Peripheral vascular disease, unspecified: Secondary | ICD-10-CM | POA: Diagnosis not present

## 2017-02-09 DIAGNOSIS — I10 Essential (primary) hypertension: Secondary | ICD-10-CM | POA: Diagnosis not present

## 2017-02-09 DIAGNOSIS — E1159 Type 2 diabetes mellitus with other circulatory complications: Secondary | ICD-10-CM | POA: Diagnosis not present

## 2017-02-09 DIAGNOSIS — E785 Hyperlipidemia, unspecified: Secondary | ICD-10-CM | POA: Diagnosis not present

## 2017-02-11 DIAGNOSIS — I739 Peripheral vascular disease, unspecified: Secondary | ICD-10-CM | POA: Diagnosis not present

## 2017-02-11 DIAGNOSIS — I313 Pericardial effusion (noninflammatory): Secondary | ICD-10-CM | POA: Diagnosis not present

## 2017-02-11 DIAGNOSIS — I25119 Atherosclerotic heart disease of native coronary artery with unspecified angina pectoris: Secondary | ICD-10-CM | POA: Diagnosis not present

## 2017-02-11 DIAGNOSIS — E1159 Type 2 diabetes mellitus with other circulatory complications: Secondary | ICD-10-CM | POA: Diagnosis not present

## 2017-02-11 DIAGNOSIS — I5030 Unspecified diastolic (congestive) heart failure: Secondary | ICD-10-CM | POA: Diagnosis not present

## 2017-02-11 DIAGNOSIS — N189 Chronic kidney disease, unspecified: Secondary | ICD-10-CM | POA: Diagnosis not present

## 2017-02-15 DIAGNOSIS — E1159 Type 2 diabetes mellitus with other circulatory complications: Secondary | ICD-10-CM | POA: Diagnosis not present

## 2017-02-15 DIAGNOSIS — I25119 Atherosclerotic heart disease of native coronary artery with unspecified angina pectoris: Secondary | ICD-10-CM | POA: Diagnosis not present

## 2017-02-15 DIAGNOSIS — I313 Pericardial effusion (noninflammatory): Secondary | ICD-10-CM | POA: Diagnosis not present

## 2017-02-15 DIAGNOSIS — I5030 Unspecified diastolic (congestive) heart failure: Secondary | ICD-10-CM | POA: Diagnosis not present

## 2017-02-15 DIAGNOSIS — N189 Chronic kidney disease, unspecified: Secondary | ICD-10-CM | POA: Diagnosis not present

## 2017-02-15 DIAGNOSIS — I739 Peripheral vascular disease, unspecified: Secondary | ICD-10-CM | POA: Diagnosis not present

## 2017-02-18 DIAGNOSIS — N189 Chronic kidney disease, unspecified: Secondary | ICD-10-CM | POA: Diagnosis not present

## 2017-02-18 DIAGNOSIS — I739 Peripheral vascular disease, unspecified: Secondary | ICD-10-CM | POA: Diagnosis not present

## 2017-02-18 DIAGNOSIS — E1159 Type 2 diabetes mellitus with other circulatory complications: Secondary | ICD-10-CM | POA: Diagnosis not present

## 2017-02-18 DIAGNOSIS — I5030 Unspecified diastolic (congestive) heart failure: Secondary | ICD-10-CM | POA: Diagnosis not present

## 2017-02-18 DIAGNOSIS — I313 Pericardial effusion (noninflammatory): Secondary | ICD-10-CM | POA: Diagnosis not present

## 2017-02-18 DIAGNOSIS — I25119 Atherosclerotic heart disease of native coronary artery with unspecified angina pectoris: Secondary | ICD-10-CM | POA: Diagnosis not present

## 2017-02-22 DIAGNOSIS — I25119 Atherosclerotic heart disease of native coronary artery with unspecified angina pectoris: Secondary | ICD-10-CM | POA: Diagnosis not present

## 2017-02-22 DIAGNOSIS — N189 Chronic kidney disease, unspecified: Secondary | ICD-10-CM | POA: Diagnosis not present

## 2017-02-22 DIAGNOSIS — I739 Peripheral vascular disease, unspecified: Secondary | ICD-10-CM | POA: Diagnosis not present

## 2017-02-22 DIAGNOSIS — I5030 Unspecified diastolic (congestive) heart failure: Secondary | ICD-10-CM | POA: Diagnosis not present

## 2017-02-22 DIAGNOSIS — I313 Pericardial effusion (noninflammatory): Secondary | ICD-10-CM | POA: Diagnosis not present

## 2017-02-22 DIAGNOSIS — E1159 Type 2 diabetes mellitus with other circulatory complications: Secondary | ICD-10-CM | POA: Diagnosis not present

## 2017-02-25 DIAGNOSIS — I739 Peripheral vascular disease, unspecified: Secondary | ICD-10-CM | POA: Diagnosis not present

## 2017-02-25 DIAGNOSIS — E1159 Type 2 diabetes mellitus with other circulatory complications: Secondary | ICD-10-CM | POA: Diagnosis not present

## 2017-02-25 DIAGNOSIS — I313 Pericardial effusion (noninflammatory): Secondary | ICD-10-CM | POA: Diagnosis not present

## 2017-02-25 DIAGNOSIS — I5030 Unspecified diastolic (congestive) heart failure: Secondary | ICD-10-CM | POA: Diagnosis not present

## 2017-02-25 DIAGNOSIS — N189 Chronic kidney disease, unspecified: Secondary | ICD-10-CM | POA: Diagnosis not present

## 2017-02-25 DIAGNOSIS — I25119 Atherosclerotic heart disease of native coronary artery with unspecified angina pectoris: Secondary | ICD-10-CM | POA: Diagnosis not present

## 2017-03-01 DIAGNOSIS — N189 Chronic kidney disease, unspecified: Secondary | ICD-10-CM | POA: Diagnosis not present

## 2017-03-01 DIAGNOSIS — I5030 Unspecified diastolic (congestive) heart failure: Secondary | ICD-10-CM | POA: Diagnosis not present

## 2017-03-01 DIAGNOSIS — E1159 Type 2 diabetes mellitus with other circulatory complications: Secondary | ICD-10-CM | POA: Diagnosis not present

## 2017-03-01 DIAGNOSIS — I739 Peripheral vascular disease, unspecified: Secondary | ICD-10-CM | POA: Diagnosis not present

## 2017-03-01 DIAGNOSIS — I25119 Atherosclerotic heart disease of native coronary artery with unspecified angina pectoris: Secondary | ICD-10-CM | POA: Diagnosis not present

## 2017-03-01 DIAGNOSIS — I313 Pericardial effusion (noninflammatory): Secondary | ICD-10-CM | POA: Diagnosis not present

## 2017-03-04 DIAGNOSIS — E1159 Type 2 diabetes mellitus with other circulatory complications: Secondary | ICD-10-CM | POA: Diagnosis not present

## 2017-03-04 DIAGNOSIS — I5030 Unspecified diastolic (congestive) heart failure: Secondary | ICD-10-CM | POA: Diagnosis not present

## 2017-03-04 DIAGNOSIS — I313 Pericardial effusion (noninflammatory): Secondary | ICD-10-CM | POA: Diagnosis not present

## 2017-03-04 DIAGNOSIS — N189 Chronic kidney disease, unspecified: Secondary | ICD-10-CM | POA: Diagnosis not present

## 2017-03-04 DIAGNOSIS — I25119 Atherosclerotic heart disease of native coronary artery with unspecified angina pectoris: Secondary | ICD-10-CM | POA: Diagnosis not present

## 2017-03-04 DIAGNOSIS — I739 Peripheral vascular disease, unspecified: Secondary | ICD-10-CM | POA: Diagnosis not present

## 2017-03-08 DIAGNOSIS — I25119 Atherosclerotic heart disease of native coronary artery with unspecified angina pectoris: Secondary | ICD-10-CM | POA: Diagnosis not present

## 2017-03-08 DIAGNOSIS — N189 Chronic kidney disease, unspecified: Secondary | ICD-10-CM | POA: Diagnosis not present

## 2017-03-08 DIAGNOSIS — I5030 Unspecified diastolic (congestive) heart failure: Secondary | ICD-10-CM | POA: Diagnosis not present

## 2017-03-08 DIAGNOSIS — I313 Pericardial effusion (noninflammatory): Secondary | ICD-10-CM | POA: Diagnosis not present

## 2017-03-08 DIAGNOSIS — I739 Peripheral vascular disease, unspecified: Secondary | ICD-10-CM | POA: Diagnosis not present

## 2017-03-08 DIAGNOSIS — E1159 Type 2 diabetes mellitus with other circulatory complications: Secondary | ICD-10-CM | POA: Diagnosis not present

## 2017-03-11 DIAGNOSIS — N189 Chronic kidney disease, unspecified: Secondary | ICD-10-CM | POA: Diagnosis not present

## 2017-03-11 DIAGNOSIS — I25119 Atherosclerotic heart disease of native coronary artery with unspecified angina pectoris: Secondary | ICD-10-CM | POA: Diagnosis not present

## 2017-03-11 DIAGNOSIS — I5030 Unspecified diastolic (congestive) heart failure: Secondary | ICD-10-CM | POA: Diagnosis not present

## 2017-03-11 DIAGNOSIS — I739 Peripheral vascular disease, unspecified: Secondary | ICD-10-CM | POA: Diagnosis not present

## 2017-03-11 DIAGNOSIS — I313 Pericardial effusion (noninflammatory): Secondary | ICD-10-CM | POA: Diagnosis not present

## 2017-03-11 DIAGNOSIS — E1159 Type 2 diabetes mellitus with other circulatory complications: Secondary | ICD-10-CM | POA: Diagnosis not present

## 2017-03-12 DIAGNOSIS — N189 Chronic kidney disease, unspecified: Secondary | ICD-10-CM | POA: Diagnosis not present

## 2017-03-12 DIAGNOSIS — I5030 Unspecified diastolic (congestive) heart failure: Secondary | ICD-10-CM | POA: Diagnosis not present

## 2017-03-12 DIAGNOSIS — J301 Allergic rhinitis due to pollen: Secondary | ICD-10-CM | POA: Diagnosis not present

## 2017-03-12 DIAGNOSIS — M1990 Unspecified osteoarthritis, unspecified site: Secondary | ICD-10-CM | POA: Diagnosis not present

## 2017-03-12 DIAGNOSIS — I739 Peripheral vascular disease, unspecified: Secondary | ICD-10-CM | POA: Diagnosis not present

## 2017-03-12 DIAGNOSIS — N401 Enlarged prostate with lower urinary tract symptoms: Secondary | ICD-10-CM | POA: Diagnosis not present

## 2017-03-12 DIAGNOSIS — M109 Gout, unspecified: Secondary | ICD-10-CM | POA: Diagnosis not present

## 2017-03-12 DIAGNOSIS — E1159 Type 2 diabetes mellitus with other circulatory complications: Secondary | ICD-10-CM | POA: Diagnosis not present

## 2017-03-12 DIAGNOSIS — I313 Pericardial effusion (noninflammatory): Secondary | ICD-10-CM | POA: Diagnosis not present

## 2017-03-12 DIAGNOSIS — I10 Essential (primary) hypertension: Secondary | ICD-10-CM | POA: Diagnosis not present

## 2017-03-12 DIAGNOSIS — I25119 Atherosclerotic heart disease of native coronary artery with unspecified angina pectoris: Secondary | ICD-10-CM | POA: Diagnosis not present

## 2017-03-12 DIAGNOSIS — I4891 Unspecified atrial fibrillation: Secondary | ICD-10-CM | POA: Diagnosis not present

## 2017-03-12 DIAGNOSIS — E785 Hyperlipidemia, unspecified: Secondary | ICD-10-CM | POA: Diagnosis not present

## 2017-03-15 ENCOUNTER — Other Ambulatory Visit: Payer: Self-pay | Admitting: Internal Medicine

## 2017-03-15 DIAGNOSIS — I5030 Unspecified diastolic (congestive) heart failure: Secondary | ICD-10-CM | POA: Diagnosis not present

## 2017-03-15 DIAGNOSIS — I739 Peripheral vascular disease, unspecified: Secondary | ICD-10-CM | POA: Diagnosis not present

## 2017-03-15 DIAGNOSIS — I25119 Atherosclerotic heart disease of native coronary artery with unspecified angina pectoris: Secondary | ICD-10-CM | POA: Diagnosis not present

## 2017-03-15 DIAGNOSIS — I313 Pericardial effusion (noninflammatory): Secondary | ICD-10-CM | POA: Diagnosis not present

## 2017-03-15 DIAGNOSIS — E1159 Type 2 diabetes mellitus with other circulatory complications: Secondary | ICD-10-CM | POA: Diagnosis not present

## 2017-03-15 DIAGNOSIS — N189 Chronic kidney disease, unspecified: Secondary | ICD-10-CM | POA: Diagnosis not present

## 2017-03-18 DIAGNOSIS — I739 Peripheral vascular disease, unspecified: Secondary | ICD-10-CM | POA: Diagnosis not present

## 2017-03-18 DIAGNOSIS — E1159 Type 2 diabetes mellitus with other circulatory complications: Secondary | ICD-10-CM | POA: Diagnosis not present

## 2017-03-18 DIAGNOSIS — N189 Chronic kidney disease, unspecified: Secondary | ICD-10-CM | POA: Diagnosis not present

## 2017-03-18 DIAGNOSIS — I5030 Unspecified diastolic (congestive) heart failure: Secondary | ICD-10-CM | POA: Diagnosis not present

## 2017-03-18 DIAGNOSIS — I313 Pericardial effusion (noninflammatory): Secondary | ICD-10-CM | POA: Diagnosis not present

## 2017-03-18 DIAGNOSIS — I25119 Atherosclerotic heart disease of native coronary artery with unspecified angina pectoris: Secondary | ICD-10-CM | POA: Diagnosis not present

## 2017-03-22 DIAGNOSIS — I739 Peripheral vascular disease, unspecified: Secondary | ICD-10-CM | POA: Diagnosis not present

## 2017-03-22 DIAGNOSIS — I313 Pericardial effusion (noninflammatory): Secondary | ICD-10-CM | POA: Diagnosis not present

## 2017-03-22 DIAGNOSIS — N189 Chronic kidney disease, unspecified: Secondary | ICD-10-CM | POA: Diagnosis not present

## 2017-03-22 DIAGNOSIS — E1159 Type 2 diabetes mellitus with other circulatory complications: Secondary | ICD-10-CM | POA: Diagnosis not present

## 2017-03-22 DIAGNOSIS — I25119 Atherosclerotic heart disease of native coronary artery with unspecified angina pectoris: Secondary | ICD-10-CM | POA: Diagnosis not present

## 2017-03-22 DIAGNOSIS — I5030 Unspecified diastolic (congestive) heart failure: Secondary | ICD-10-CM | POA: Diagnosis not present

## 2017-03-25 DIAGNOSIS — N189 Chronic kidney disease, unspecified: Secondary | ICD-10-CM | POA: Diagnosis not present

## 2017-03-25 DIAGNOSIS — I739 Peripheral vascular disease, unspecified: Secondary | ICD-10-CM | POA: Diagnosis not present

## 2017-03-25 DIAGNOSIS — I313 Pericardial effusion (noninflammatory): Secondary | ICD-10-CM | POA: Diagnosis not present

## 2017-03-25 DIAGNOSIS — I25119 Atherosclerotic heart disease of native coronary artery with unspecified angina pectoris: Secondary | ICD-10-CM | POA: Diagnosis not present

## 2017-03-25 DIAGNOSIS — I5030 Unspecified diastolic (congestive) heart failure: Secondary | ICD-10-CM | POA: Diagnosis not present

## 2017-03-25 DIAGNOSIS — E1159 Type 2 diabetes mellitus with other circulatory complications: Secondary | ICD-10-CM | POA: Diagnosis not present

## 2017-03-29 DIAGNOSIS — E1159 Type 2 diabetes mellitus with other circulatory complications: Secondary | ICD-10-CM | POA: Diagnosis not present

## 2017-03-29 DIAGNOSIS — I313 Pericardial effusion (noninflammatory): Secondary | ICD-10-CM | POA: Diagnosis not present

## 2017-03-29 DIAGNOSIS — N189 Chronic kidney disease, unspecified: Secondary | ICD-10-CM | POA: Diagnosis not present

## 2017-03-29 DIAGNOSIS — I25119 Atherosclerotic heart disease of native coronary artery with unspecified angina pectoris: Secondary | ICD-10-CM | POA: Diagnosis not present

## 2017-03-29 DIAGNOSIS — I5030 Unspecified diastolic (congestive) heart failure: Secondary | ICD-10-CM | POA: Diagnosis not present

## 2017-03-29 DIAGNOSIS — I739 Peripheral vascular disease, unspecified: Secondary | ICD-10-CM | POA: Diagnosis not present

## 2017-04-01 DIAGNOSIS — N189 Chronic kidney disease, unspecified: Secondary | ICD-10-CM | POA: Diagnosis not present

## 2017-04-01 DIAGNOSIS — E1159 Type 2 diabetes mellitus with other circulatory complications: Secondary | ICD-10-CM | POA: Diagnosis not present

## 2017-04-01 DIAGNOSIS — I739 Peripheral vascular disease, unspecified: Secondary | ICD-10-CM | POA: Diagnosis not present

## 2017-04-01 DIAGNOSIS — I313 Pericardial effusion (noninflammatory): Secondary | ICD-10-CM | POA: Diagnosis not present

## 2017-04-01 DIAGNOSIS — I25119 Atherosclerotic heart disease of native coronary artery with unspecified angina pectoris: Secondary | ICD-10-CM | POA: Diagnosis not present

## 2017-04-01 DIAGNOSIS — I5030 Unspecified diastolic (congestive) heart failure: Secondary | ICD-10-CM | POA: Diagnosis not present

## 2017-04-05 DIAGNOSIS — N189 Chronic kidney disease, unspecified: Secondary | ICD-10-CM | POA: Diagnosis not present

## 2017-04-05 DIAGNOSIS — I313 Pericardial effusion (noninflammatory): Secondary | ICD-10-CM | POA: Diagnosis not present

## 2017-04-05 DIAGNOSIS — I5030 Unspecified diastolic (congestive) heart failure: Secondary | ICD-10-CM | POA: Diagnosis not present

## 2017-04-05 DIAGNOSIS — I739 Peripheral vascular disease, unspecified: Secondary | ICD-10-CM | POA: Diagnosis not present

## 2017-04-05 DIAGNOSIS — I25119 Atherosclerotic heart disease of native coronary artery with unspecified angina pectoris: Secondary | ICD-10-CM | POA: Diagnosis not present

## 2017-04-05 DIAGNOSIS — E1159 Type 2 diabetes mellitus with other circulatory complications: Secondary | ICD-10-CM | POA: Diagnosis not present

## 2017-04-08 DIAGNOSIS — I25119 Atherosclerotic heart disease of native coronary artery with unspecified angina pectoris: Secondary | ICD-10-CM | POA: Diagnosis not present

## 2017-04-08 DIAGNOSIS — I739 Peripheral vascular disease, unspecified: Secondary | ICD-10-CM | POA: Diagnosis not present

## 2017-04-08 DIAGNOSIS — N189 Chronic kidney disease, unspecified: Secondary | ICD-10-CM | POA: Diagnosis not present

## 2017-04-08 DIAGNOSIS — I313 Pericardial effusion (noninflammatory): Secondary | ICD-10-CM | POA: Diagnosis not present

## 2017-04-08 DIAGNOSIS — I5030 Unspecified diastolic (congestive) heart failure: Secondary | ICD-10-CM | POA: Diagnosis not present

## 2017-04-08 DIAGNOSIS — E1159 Type 2 diabetes mellitus with other circulatory complications: Secondary | ICD-10-CM | POA: Diagnosis not present

## 2017-04-09 DIAGNOSIS — I739 Peripheral vascular disease, unspecified: Secondary | ICD-10-CM | POA: Diagnosis not present

## 2017-04-09 DIAGNOSIS — E785 Hyperlipidemia, unspecified: Secondary | ICD-10-CM | POA: Diagnosis not present

## 2017-04-09 DIAGNOSIS — M109 Gout, unspecified: Secondary | ICD-10-CM | POA: Diagnosis not present

## 2017-04-09 DIAGNOSIS — N189 Chronic kidney disease, unspecified: Secondary | ICD-10-CM | POA: Diagnosis not present

## 2017-04-09 DIAGNOSIS — I5030 Unspecified diastolic (congestive) heart failure: Secondary | ICD-10-CM | POA: Diagnosis not present

## 2017-04-09 DIAGNOSIS — I10 Essential (primary) hypertension: Secondary | ICD-10-CM | POA: Diagnosis not present

## 2017-04-09 DIAGNOSIS — E1159 Type 2 diabetes mellitus with other circulatory complications: Secondary | ICD-10-CM | POA: Diagnosis not present

## 2017-04-09 DIAGNOSIS — M1990 Unspecified osteoarthritis, unspecified site: Secondary | ICD-10-CM | POA: Diagnosis not present

## 2017-04-09 DIAGNOSIS — I313 Pericardial effusion (noninflammatory): Secondary | ICD-10-CM | POA: Diagnosis not present

## 2017-04-09 DIAGNOSIS — N401 Enlarged prostate with lower urinary tract symptoms: Secondary | ICD-10-CM | POA: Diagnosis not present

## 2017-04-09 DIAGNOSIS — J301 Allergic rhinitis due to pollen: Secondary | ICD-10-CM | POA: Diagnosis not present

## 2017-04-09 DIAGNOSIS — I25119 Atherosclerotic heart disease of native coronary artery with unspecified angina pectoris: Secondary | ICD-10-CM | POA: Diagnosis not present

## 2017-04-09 DIAGNOSIS — I4891 Unspecified atrial fibrillation: Secondary | ICD-10-CM | POA: Diagnosis not present

## 2017-04-12 DIAGNOSIS — I313 Pericardial effusion (noninflammatory): Secondary | ICD-10-CM | POA: Diagnosis not present

## 2017-04-12 DIAGNOSIS — I25119 Atherosclerotic heart disease of native coronary artery with unspecified angina pectoris: Secondary | ICD-10-CM | POA: Diagnosis not present

## 2017-04-12 DIAGNOSIS — E1159 Type 2 diabetes mellitus with other circulatory complications: Secondary | ICD-10-CM | POA: Diagnosis not present

## 2017-04-12 DIAGNOSIS — N189 Chronic kidney disease, unspecified: Secondary | ICD-10-CM | POA: Diagnosis not present

## 2017-04-12 DIAGNOSIS — I739 Peripheral vascular disease, unspecified: Secondary | ICD-10-CM | POA: Diagnosis not present

## 2017-04-12 DIAGNOSIS — I5030 Unspecified diastolic (congestive) heart failure: Secondary | ICD-10-CM | POA: Diagnosis not present

## 2017-04-15 DIAGNOSIS — I739 Peripheral vascular disease, unspecified: Secondary | ICD-10-CM | POA: Diagnosis not present

## 2017-04-15 DIAGNOSIS — I5030 Unspecified diastolic (congestive) heart failure: Secondary | ICD-10-CM | POA: Diagnosis not present

## 2017-04-15 DIAGNOSIS — N189 Chronic kidney disease, unspecified: Secondary | ICD-10-CM | POA: Diagnosis not present

## 2017-04-15 DIAGNOSIS — I25119 Atherosclerotic heart disease of native coronary artery with unspecified angina pectoris: Secondary | ICD-10-CM | POA: Diagnosis not present

## 2017-04-15 DIAGNOSIS — I313 Pericardial effusion (noninflammatory): Secondary | ICD-10-CM | POA: Diagnosis not present

## 2017-04-15 DIAGNOSIS — E1159 Type 2 diabetes mellitus with other circulatory complications: Secondary | ICD-10-CM | POA: Diagnosis not present

## 2017-04-19 DIAGNOSIS — I739 Peripheral vascular disease, unspecified: Secondary | ICD-10-CM | POA: Diagnosis not present

## 2017-04-19 DIAGNOSIS — I5030 Unspecified diastolic (congestive) heart failure: Secondary | ICD-10-CM | POA: Diagnosis not present

## 2017-04-19 DIAGNOSIS — I313 Pericardial effusion (noninflammatory): Secondary | ICD-10-CM | POA: Diagnosis not present

## 2017-04-19 DIAGNOSIS — I25119 Atherosclerotic heart disease of native coronary artery with unspecified angina pectoris: Secondary | ICD-10-CM | POA: Diagnosis not present

## 2017-04-19 DIAGNOSIS — N189 Chronic kidney disease, unspecified: Secondary | ICD-10-CM | POA: Diagnosis not present

## 2017-04-19 DIAGNOSIS — E1159 Type 2 diabetes mellitus with other circulatory complications: Secondary | ICD-10-CM | POA: Diagnosis not present

## 2017-04-22 DIAGNOSIS — N189 Chronic kidney disease, unspecified: Secondary | ICD-10-CM | POA: Diagnosis not present

## 2017-04-22 DIAGNOSIS — I313 Pericardial effusion (noninflammatory): Secondary | ICD-10-CM | POA: Diagnosis not present

## 2017-04-22 DIAGNOSIS — E1159 Type 2 diabetes mellitus with other circulatory complications: Secondary | ICD-10-CM | POA: Diagnosis not present

## 2017-04-22 DIAGNOSIS — I25119 Atherosclerotic heart disease of native coronary artery with unspecified angina pectoris: Secondary | ICD-10-CM | POA: Diagnosis not present

## 2017-04-22 DIAGNOSIS — I5030 Unspecified diastolic (congestive) heart failure: Secondary | ICD-10-CM | POA: Diagnosis not present

## 2017-04-22 DIAGNOSIS — I739 Peripheral vascular disease, unspecified: Secondary | ICD-10-CM | POA: Diagnosis not present

## 2017-04-26 DIAGNOSIS — E1159 Type 2 diabetes mellitus with other circulatory complications: Secondary | ICD-10-CM | POA: Diagnosis not present

## 2017-04-26 DIAGNOSIS — I739 Peripheral vascular disease, unspecified: Secondary | ICD-10-CM | POA: Diagnosis not present

## 2017-04-26 DIAGNOSIS — N189 Chronic kidney disease, unspecified: Secondary | ICD-10-CM | POA: Diagnosis not present

## 2017-04-26 DIAGNOSIS — I25119 Atherosclerotic heart disease of native coronary artery with unspecified angina pectoris: Secondary | ICD-10-CM | POA: Diagnosis not present

## 2017-04-26 DIAGNOSIS — I313 Pericardial effusion (noninflammatory): Secondary | ICD-10-CM | POA: Diagnosis not present

## 2017-04-26 DIAGNOSIS — I5030 Unspecified diastolic (congestive) heart failure: Secondary | ICD-10-CM | POA: Diagnosis not present

## 2017-04-29 DIAGNOSIS — I739 Peripheral vascular disease, unspecified: Secondary | ICD-10-CM | POA: Diagnosis not present

## 2017-04-29 DIAGNOSIS — I25119 Atherosclerotic heart disease of native coronary artery with unspecified angina pectoris: Secondary | ICD-10-CM | POA: Diagnosis not present

## 2017-04-29 DIAGNOSIS — I313 Pericardial effusion (noninflammatory): Secondary | ICD-10-CM | POA: Diagnosis not present

## 2017-04-29 DIAGNOSIS — I5030 Unspecified diastolic (congestive) heart failure: Secondary | ICD-10-CM | POA: Diagnosis not present

## 2017-04-29 DIAGNOSIS — N189 Chronic kidney disease, unspecified: Secondary | ICD-10-CM | POA: Diagnosis not present

## 2017-04-29 DIAGNOSIS — E1159 Type 2 diabetes mellitus with other circulatory complications: Secondary | ICD-10-CM | POA: Diagnosis not present

## 2017-05-03 DIAGNOSIS — I313 Pericardial effusion (noninflammatory): Secondary | ICD-10-CM | POA: Diagnosis not present

## 2017-05-03 DIAGNOSIS — I25119 Atherosclerotic heart disease of native coronary artery with unspecified angina pectoris: Secondary | ICD-10-CM | POA: Diagnosis not present

## 2017-05-03 DIAGNOSIS — I739 Peripheral vascular disease, unspecified: Secondary | ICD-10-CM | POA: Diagnosis not present

## 2017-05-03 DIAGNOSIS — N189 Chronic kidney disease, unspecified: Secondary | ICD-10-CM | POA: Diagnosis not present

## 2017-05-03 DIAGNOSIS — I5030 Unspecified diastolic (congestive) heart failure: Secondary | ICD-10-CM | POA: Diagnosis not present

## 2017-05-03 DIAGNOSIS — E1159 Type 2 diabetes mellitus with other circulatory complications: Secondary | ICD-10-CM | POA: Diagnosis not present

## 2017-05-06 DIAGNOSIS — E1159 Type 2 diabetes mellitus with other circulatory complications: Secondary | ICD-10-CM | POA: Diagnosis not present

## 2017-05-06 DIAGNOSIS — I5030 Unspecified diastolic (congestive) heart failure: Secondary | ICD-10-CM | POA: Diagnosis not present

## 2017-05-06 DIAGNOSIS — I25119 Atherosclerotic heart disease of native coronary artery with unspecified angina pectoris: Secondary | ICD-10-CM | POA: Diagnosis not present

## 2017-05-06 DIAGNOSIS — I739 Peripheral vascular disease, unspecified: Secondary | ICD-10-CM | POA: Diagnosis not present

## 2017-05-06 DIAGNOSIS — I313 Pericardial effusion (noninflammatory): Secondary | ICD-10-CM | POA: Diagnosis not present

## 2017-05-06 DIAGNOSIS — N189 Chronic kidney disease, unspecified: Secondary | ICD-10-CM | POA: Diagnosis not present

## 2017-05-10 DIAGNOSIS — E1159 Type 2 diabetes mellitus with other circulatory complications: Secondary | ICD-10-CM | POA: Diagnosis not present

## 2017-05-10 DIAGNOSIS — N189 Chronic kidney disease, unspecified: Secondary | ICD-10-CM | POA: Diagnosis not present

## 2017-05-10 DIAGNOSIS — E785 Hyperlipidemia, unspecified: Secondary | ICD-10-CM | POA: Diagnosis not present

## 2017-05-10 DIAGNOSIS — M109 Gout, unspecified: Secondary | ICD-10-CM | POA: Diagnosis not present

## 2017-05-10 DIAGNOSIS — M1990 Unspecified osteoarthritis, unspecified site: Secondary | ICD-10-CM | POA: Diagnosis not present

## 2017-05-10 DIAGNOSIS — I5030 Unspecified diastolic (congestive) heart failure: Secondary | ICD-10-CM | POA: Diagnosis not present

## 2017-05-10 DIAGNOSIS — I313 Pericardial effusion (noninflammatory): Secondary | ICD-10-CM | POA: Diagnosis not present

## 2017-05-10 DIAGNOSIS — J301 Allergic rhinitis due to pollen: Secondary | ICD-10-CM | POA: Diagnosis not present

## 2017-05-10 DIAGNOSIS — I739 Peripheral vascular disease, unspecified: Secondary | ICD-10-CM | POA: Diagnosis not present

## 2017-05-10 DIAGNOSIS — I10 Essential (primary) hypertension: Secondary | ICD-10-CM | POA: Diagnosis not present

## 2017-05-10 DIAGNOSIS — N401 Enlarged prostate with lower urinary tract symptoms: Secondary | ICD-10-CM | POA: Diagnosis not present

## 2017-05-10 DIAGNOSIS — I25119 Atherosclerotic heart disease of native coronary artery with unspecified angina pectoris: Secondary | ICD-10-CM | POA: Diagnosis not present

## 2017-05-10 DIAGNOSIS — I4891 Unspecified atrial fibrillation: Secondary | ICD-10-CM | POA: Diagnosis not present

## 2017-05-13 DIAGNOSIS — I5030 Unspecified diastolic (congestive) heart failure: Secondary | ICD-10-CM | POA: Diagnosis not present

## 2017-05-13 DIAGNOSIS — I739 Peripheral vascular disease, unspecified: Secondary | ICD-10-CM | POA: Diagnosis not present

## 2017-05-13 DIAGNOSIS — I25119 Atherosclerotic heart disease of native coronary artery with unspecified angina pectoris: Secondary | ICD-10-CM | POA: Diagnosis not present

## 2017-05-13 DIAGNOSIS — N189 Chronic kidney disease, unspecified: Secondary | ICD-10-CM | POA: Diagnosis not present

## 2017-05-13 DIAGNOSIS — I313 Pericardial effusion (noninflammatory): Secondary | ICD-10-CM | POA: Diagnosis not present

## 2017-05-13 DIAGNOSIS — E1159 Type 2 diabetes mellitus with other circulatory complications: Secondary | ICD-10-CM | POA: Diagnosis not present

## 2017-05-17 DIAGNOSIS — I25119 Atherosclerotic heart disease of native coronary artery with unspecified angina pectoris: Secondary | ICD-10-CM | POA: Diagnosis not present

## 2017-05-17 DIAGNOSIS — N189 Chronic kidney disease, unspecified: Secondary | ICD-10-CM | POA: Diagnosis not present

## 2017-05-17 DIAGNOSIS — I313 Pericardial effusion (noninflammatory): Secondary | ICD-10-CM | POA: Diagnosis not present

## 2017-05-17 DIAGNOSIS — E1159 Type 2 diabetes mellitus with other circulatory complications: Secondary | ICD-10-CM | POA: Diagnosis not present

## 2017-05-17 DIAGNOSIS — I739 Peripheral vascular disease, unspecified: Secondary | ICD-10-CM | POA: Diagnosis not present

## 2017-05-17 DIAGNOSIS — I5030 Unspecified diastolic (congestive) heart failure: Secondary | ICD-10-CM | POA: Diagnosis not present

## 2017-05-20 DIAGNOSIS — I739 Peripheral vascular disease, unspecified: Secondary | ICD-10-CM | POA: Diagnosis not present

## 2017-05-20 DIAGNOSIS — I313 Pericardial effusion (noninflammatory): Secondary | ICD-10-CM | POA: Diagnosis not present

## 2017-05-20 DIAGNOSIS — I25119 Atherosclerotic heart disease of native coronary artery with unspecified angina pectoris: Secondary | ICD-10-CM | POA: Diagnosis not present

## 2017-05-20 DIAGNOSIS — N189 Chronic kidney disease, unspecified: Secondary | ICD-10-CM | POA: Diagnosis not present

## 2017-05-20 DIAGNOSIS — E1159 Type 2 diabetes mellitus with other circulatory complications: Secondary | ICD-10-CM | POA: Diagnosis not present

## 2017-05-20 DIAGNOSIS — I5030 Unspecified diastolic (congestive) heart failure: Secondary | ICD-10-CM | POA: Diagnosis not present

## 2017-05-24 DIAGNOSIS — I25119 Atherosclerotic heart disease of native coronary artery with unspecified angina pectoris: Secondary | ICD-10-CM | POA: Diagnosis not present

## 2017-05-24 DIAGNOSIS — I739 Peripheral vascular disease, unspecified: Secondary | ICD-10-CM | POA: Diagnosis not present

## 2017-05-24 DIAGNOSIS — I313 Pericardial effusion (noninflammatory): Secondary | ICD-10-CM | POA: Diagnosis not present

## 2017-05-24 DIAGNOSIS — E1159 Type 2 diabetes mellitus with other circulatory complications: Secondary | ICD-10-CM | POA: Diagnosis not present

## 2017-05-24 DIAGNOSIS — N189 Chronic kidney disease, unspecified: Secondary | ICD-10-CM | POA: Diagnosis not present

## 2017-05-24 DIAGNOSIS — I5030 Unspecified diastolic (congestive) heart failure: Secondary | ICD-10-CM | POA: Diagnosis not present

## 2017-05-27 DIAGNOSIS — I739 Peripheral vascular disease, unspecified: Secondary | ICD-10-CM | POA: Diagnosis not present

## 2017-05-27 DIAGNOSIS — N189 Chronic kidney disease, unspecified: Secondary | ICD-10-CM | POA: Diagnosis not present

## 2017-05-27 DIAGNOSIS — E1159 Type 2 diabetes mellitus with other circulatory complications: Secondary | ICD-10-CM | POA: Diagnosis not present

## 2017-05-27 DIAGNOSIS — I25119 Atherosclerotic heart disease of native coronary artery with unspecified angina pectoris: Secondary | ICD-10-CM | POA: Diagnosis not present

## 2017-05-27 DIAGNOSIS — I313 Pericardial effusion (noninflammatory): Secondary | ICD-10-CM | POA: Diagnosis not present

## 2017-05-27 DIAGNOSIS — I5030 Unspecified diastolic (congestive) heart failure: Secondary | ICD-10-CM | POA: Diagnosis not present

## 2017-05-31 DIAGNOSIS — N189 Chronic kidney disease, unspecified: Secondary | ICD-10-CM | POA: Diagnosis not present

## 2017-05-31 DIAGNOSIS — I5030 Unspecified diastolic (congestive) heart failure: Secondary | ICD-10-CM | POA: Diagnosis not present

## 2017-05-31 DIAGNOSIS — I739 Peripheral vascular disease, unspecified: Secondary | ICD-10-CM | POA: Diagnosis not present

## 2017-05-31 DIAGNOSIS — I313 Pericardial effusion (noninflammatory): Secondary | ICD-10-CM | POA: Diagnosis not present

## 2017-05-31 DIAGNOSIS — E1159 Type 2 diabetes mellitus with other circulatory complications: Secondary | ICD-10-CM | POA: Diagnosis not present

## 2017-05-31 DIAGNOSIS — I25119 Atherosclerotic heart disease of native coronary artery with unspecified angina pectoris: Secondary | ICD-10-CM | POA: Diagnosis not present

## 2017-06-03 DIAGNOSIS — I5030 Unspecified diastolic (congestive) heart failure: Secondary | ICD-10-CM | POA: Diagnosis not present

## 2017-06-03 DIAGNOSIS — I739 Peripheral vascular disease, unspecified: Secondary | ICD-10-CM | POA: Diagnosis not present

## 2017-06-03 DIAGNOSIS — E1159 Type 2 diabetes mellitus with other circulatory complications: Secondary | ICD-10-CM | POA: Diagnosis not present

## 2017-06-03 DIAGNOSIS — I25119 Atherosclerotic heart disease of native coronary artery with unspecified angina pectoris: Secondary | ICD-10-CM | POA: Diagnosis not present

## 2017-06-03 DIAGNOSIS — I313 Pericardial effusion (noninflammatory): Secondary | ICD-10-CM | POA: Diagnosis not present

## 2017-06-03 DIAGNOSIS — N189 Chronic kidney disease, unspecified: Secondary | ICD-10-CM | POA: Diagnosis not present

## 2017-06-04 DIAGNOSIS — I739 Peripheral vascular disease, unspecified: Secondary | ICD-10-CM

## 2017-06-04 DIAGNOSIS — N189 Chronic kidney disease, unspecified: Secondary | ICD-10-CM | POA: Diagnosis not present

## 2017-06-04 DIAGNOSIS — I313 Pericardial effusion (noninflammatory): Secondary | ICD-10-CM | POA: Diagnosis not present

## 2017-06-04 DIAGNOSIS — J301 Allergic rhinitis due to pollen: Secondary | ICD-10-CM | POA: Diagnosis not present

## 2017-06-04 DIAGNOSIS — I4891 Unspecified atrial fibrillation: Secondary | ICD-10-CM | POA: Diagnosis not present

## 2017-06-04 DIAGNOSIS — E785 Hyperlipidemia, unspecified: Secondary | ICD-10-CM | POA: Diagnosis not present

## 2017-06-04 DIAGNOSIS — I25119 Atherosclerotic heart disease of native coronary artery with unspecified angina pectoris: Secondary | ICD-10-CM | POA: Diagnosis not present

## 2017-06-04 DIAGNOSIS — I10 Essential (primary) hypertension: Secondary | ICD-10-CM | POA: Diagnosis not present

## 2017-06-04 DIAGNOSIS — M109 Gout, unspecified: Secondary | ICD-10-CM | POA: Diagnosis not present

## 2017-06-04 DIAGNOSIS — I5030 Unspecified diastolic (congestive) heart failure: Secondary | ICD-10-CM | POA: Diagnosis not present

## 2017-06-04 DIAGNOSIS — M1990 Unspecified osteoarthritis, unspecified site: Secondary | ICD-10-CM

## 2017-06-04 DIAGNOSIS — N401 Enlarged prostate with lower urinary tract symptoms: Secondary | ICD-10-CM | POA: Diagnosis not present

## 2017-06-04 DIAGNOSIS — E1159 Type 2 diabetes mellitus with other circulatory complications: Secondary | ICD-10-CM | POA: Diagnosis not present

## 2017-06-07 DIAGNOSIS — I5030 Unspecified diastolic (congestive) heart failure: Secondary | ICD-10-CM | POA: Diagnosis not present

## 2017-06-07 DIAGNOSIS — I739 Peripheral vascular disease, unspecified: Secondary | ICD-10-CM | POA: Diagnosis not present

## 2017-06-07 DIAGNOSIS — N189 Chronic kidney disease, unspecified: Secondary | ICD-10-CM | POA: Diagnosis not present

## 2017-06-07 DIAGNOSIS — E1159 Type 2 diabetes mellitus with other circulatory complications: Secondary | ICD-10-CM | POA: Diagnosis not present

## 2017-06-07 DIAGNOSIS — I25119 Atherosclerotic heart disease of native coronary artery with unspecified angina pectoris: Secondary | ICD-10-CM | POA: Diagnosis not present

## 2017-06-07 DIAGNOSIS — I313 Pericardial effusion (noninflammatory): Secondary | ICD-10-CM | POA: Diagnosis not present

## 2017-06-09 DIAGNOSIS — N189 Chronic kidney disease, unspecified: Secondary | ICD-10-CM | POA: Diagnosis not present

## 2017-06-09 DIAGNOSIS — I25119 Atherosclerotic heart disease of native coronary artery with unspecified angina pectoris: Secondary | ICD-10-CM | POA: Diagnosis not present

## 2017-06-09 DIAGNOSIS — N401 Enlarged prostate with lower urinary tract symptoms: Secondary | ICD-10-CM | POA: Diagnosis not present

## 2017-06-09 DIAGNOSIS — I4891 Unspecified atrial fibrillation: Secondary | ICD-10-CM | POA: Diagnosis not present

## 2017-06-09 DIAGNOSIS — I10 Essential (primary) hypertension: Secondary | ICD-10-CM | POA: Diagnosis not present

## 2017-06-09 DIAGNOSIS — E1159 Type 2 diabetes mellitus with other circulatory complications: Secondary | ICD-10-CM | POA: Diagnosis not present

## 2017-06-09 DIAGNOSIS — M109 Gout, unspecified: Secondary | ICD-10-CM | POA: Diagnosis not present

## 2017-06-09 DIAGNOSIS — I5030 Unspecified diastolic (congestive) heart failure: Secondary | ICD-10-CM | POA: Diagnosis not present

## 2017-06-09 DIAGNOSIS — M1990 Unspecified osteoarthritis, unspecified site: Secondary | ICD-10-CM | POA: Diagnosis not present

## 2017-06-09 DIAGNOSIS — I739 Peripheral vascular disease, unspecified: Secondary | ICD-10-CM | POA: Diagnosis not present

## 2017-06-09 DIAGNOSIS — I313 Pericardial effusion (noninflammatory): Secondary | ICD-10-CM | POA: Diagnosis not present

## 2017-06-09 DIAGNOSIS — J301 Allergic rhinitis due to pollen: Secondary | ICD-10-CM | POA: Diagnosis not present

## 2017-06-09 DIAGNOSIS — E785 Hyperlipidemia, unspecified: Secondary | ICD-10-CM | POA: Diagnosis not present

## 2017-06-10 DIAGNOSIS — I5030 Unspecified diastolic (congestive) heart failure: Secondary | ICD-10-CM | POA: Diagnosis not present

## 2017-06-10 DIAGNOSIS — E1159 Type 2 diabetes mellitus with other circulatory complications: Secondary | ICD-10-CM | POA: Diagnosis not present

## 2017-06-10 DIAGNOSIS — I739 Peripheral vascular disease, unspecified: Secondary | ICD-10-CM | POA: Diagnosis not present

## 2017-06-10 DIAGNOSIS — I313 Pericardial effusion (noninflammatory): Secondary | ICD-10-CM | POA: Diagnosis not present

## 2017-06-10 DIAGNOSIS — N189 Chronic kidney disease, unspecified: Secondary | ICD-10-CM | POA: Diagnosis not present

## 2017-06-10 DIAGNOSIS — I25119 Atherosclerotic heart disease of native coronary artery with unspecified angina pectoris: Secondary | ICD-10-CM | POA: Diagnosis not present

## 2017-06-12 DIAGNOSIS — I313 Pericardial effusion (noninflammatory): Secondary | ICD-10-CM | POA: Diagnosis not present

## 2017-06-12 DIAGNOSIS — I739 Peripheral vascular disease, unspecified: Secondary | ICD-10-CM | POA: Diagnosis not present

## 2017-06-12 DIAGNOSIS — I25119 Atherosclerotic heart disease of native coronary artery with unspecified angina pectoris: Secondary | ICD-10-CM | POA: Diagnosis not present

## 2017-06-12 DIAGNOSIS — I5030 Unspecified diastolic (congestive) heart failure: Secondary | ICD-10-CM | POA: Diagnosis not present

## 2017-06-12 DIAGNOSIS — N189 Chronic kidney disease, unspecified: Secondary | ICD-10-CM | POA: Diagnosis not present

## 2017-06-12 DIAGNOSIS — E1159 Type 2 diabetes mellitus with other circulatory complications: Secondary | ICD-10-CM | POA: Diagnosis not present

## 2017-06-14 ENCOUNTER — Telehealth: Payer: Self-pay | Admitting: Internal Medicine

## 2017-06-14 DIAGNOSIS — N189 Chronic kidney disease, unspecified: Secondary | ICD-10-CM | POA: Diagnosis not present

## 2017-06-14 DIAGNOSIS — I313 Pericardial effusion (noninflammatory): Secondary | ICD-10-CM | POA: Diagnosis not present

## 2017-06-14 DIAGNOSIS — I25119 Atherosclerotic heart disease of native coronary artery with unspecified angina pectoris: Secondary | ICD-10-CM | POA: Diagnosis not present

## 2017-06-14 DIAGNOSIS — I739 Peripheral vascular disease, unspecified: Secondary | ICD-10-CM | POA: Diagnosis not present

## 2017-06-14 DIAGNOSIS — E1159 Type 2 diabetes mellitus with other circulatory complications: Secondary | ICD-10-CM | POA: Diagnosis not present

## 2017-06-14 DIAGNOSIS — I5030 Unspecified diastolic (congestive) heart failure: Secondary | ICD-10-CM | POA: Diagnosis not present

## 2017-06-14 NOTE — Telephone Encounter (Signed)
Copied from [place] [phone]. Topic: Inquiry >> Jun 14, 2017 11:37 AM Scherrie Gerlach wrote: Reason for CRM: Patty with [facility] calling to get verbal to re certify the pt for hospice

## 2017-06-14 NOTE — Telephone Encounter (Signed)
Spoke with Patty to give verbal orders for Hospice recert per MD

## 2017-06-17 DIAGNOSIS — N189 Chronic kidney disease, unspecified: Secondary | ICD-10-CM | POA: Diagnosis not present

## 2017-06-17 DIAGNOSIS — E1159 Type 2 diabetes mellitus with other circulatory complications: Secondary | ICD-10-CM | POA: Diagnosis not present

## 2017-06-17 DIAGNOSIS — I313 Pericardial effusion (noninflammatory): Secondary | ICD-10-CM | POA: Diagnosis not present

## 2017-06-17 DIAGNOSIS — I5030 Unspecified diastolic (congestive) heart failure: Secondary | ICD-10-CM | POA: Diagnosis not present

## 2017-06-17 DIAGNOSIS — I25119 Atherosclerotic heart disease of native coronary artery with unspecified angina pectoris: Secondary | ICD-10-CM | POA: Diagnosis not present

## 2017-06-17 DIAGNOSIS — I739 Peripheral vascular disease, unspecified: Secondary | ICD-10-CM | POA: Diagnosis not present

## 2017-06-21 DIAGNOSIS — I25119 Atherosclerotic heart disease of native coronary artery with unspecified angina pectoris: Secondary | ICD-10-CM | POA: Diagnosis not present

## 2017-06-21 DIAGNOSIS — N189 Chronic kidney disease, unspecified: Secondary | ICD-10-CM | POA: Diagnosis not present

## 2017-06-21 DIAGNOSIS — E1159 Type 2 diabetes mellitus with other circulatory complications: Secondary | ICD-10-CM | POA: Diagnosis not present

## 2017-06-21 DIAGNOSIS — I313 Pericardial effusion (noninflammatory): Secondary | ICD-10-CM | POA: Diagnosis not present

## 2017-06-21 DIAGNOSIS — I739 Peripheral vascular disease, unspecified: Secondary | ICD-10-CM | POA: Diagnosis not present

## 2017-06-21 DIAGNOSIS — I5030 Unspecified diastolic (congestive) heart failure: Secondary | ICD-10-CM | POA: Diagnosis not present

## 2017-06-22 DIAGNOSIS — I25119 Atherosclerotic heart disease of native coronary artery with unspecified angina pectoris: Secondary | ICD-10-CM | POA: Diagnosis not present

## 2017-06-22 DIAGNOSIS — N189 Chronic kidney disease, unspecified: Secondary | ICD-10-CM | POA: Diagnosis not present

## 2017-06-22 DIAGNOSIS — I10 Essential (primary) hypertension: Secondary | ICD-10-CM | POA: Diagnosis not present

## 2017-06-22 DIAGNOSIS — E1159 Type 2 diabetes mellitus with other circulatory complications: Secondary | ICD-10-CM | POA: Diagnosis not present

## 2017-06-22 DIAGNOSIS — J301 Allergic rhinitis due to pollen: Secondary | ICD-10-CM | POA: Diagnosis not present

## 2017-06-22 DIAGNOSIS — M1990 Unspecified osteoarthritis, unspecified site: Secondary | ICD-10-CM

## 2017-06-22 DIAGNOSIS — M109 Gout, unspecified: Secondary | ICD-10-CM

## 2017-06-22 DIAGNOSIS — I4891 Unspecified atrial fibrillation: Secondary | ICD-10-CM | POA: Diagnosis not present

## 2017-06-22 DIAGNOSIS — N401 Enlarged prostate with lower urinary tract symptoms: Secondary | ICD-10-CM

## 2017-06-22 DIAGNOSIS — I739 Peripheral vascular disease, unspecified: Secondary | ICD-10-CM | POA: Diagnosis not present

## 2017-06-22 DIAGNOSIS — E785 Hyperlipidemia, unspecified: Secondary | ICD-10-CM | POA: Diagnosis not present

## 2017-06-22 DIAGNOSIS — I5030 Unspecified diastolic (congestive) heart failure: Secondary | ICD-10-CM | POA: Diagnosis not present

## 2017-06-22 DIAGNOSIS — I313 Pericardial effusion (noninflammatory): Secondary | ICD-10-CM | POA: Diagnosis not present

## 2017-06-24 DIAGNOSIS — I25119 Atherosclerotic heart disease of native coronary artery with unspecified angina pectoris: Secondary | ICD-10-CM | POA: Diagnosis not present

## 2017-06-24 DIAGNOSIS — I739 Peripheral vascular disease, unspecified: Secondary | ICD-10-CM | POA: Diagnosis not present

## 2017-06-24 DIAGNOSIS — N189 Chronic kidney disease, unspecified: Secondary | ICD-10-CM | POA: Diagnosis not present

## 2017-06-24 DIAGNOSIS — I313 Pericardial effusion (noninflammatory): Secondary | ICD-10-CM | POA: Diagnosis not present

## 2017-06-24 DIAGNOSIS — E1159 Type 2 diabetes mellitus with other circulatory complications: Secondary | ICD-10-CM | POA: Diagnosis not present

## 2017-06-24 DIAGNOSIS — I5030 Unspecified diastolic (congestive) heart failure: Secondary | ICD-10-CM | POA: Diagnosis not present

## 2017-06-28 DIAGNOSIS — I313 Pericardial effusion (noninflammatory): Secondary | ICD-10-CM | POA: Diagnosis not present

## 2017-06-28 DIAGNOSIS — I25119 Atherosclerotic heart disease of native coronary artery with unspecified angina pectoris: Secondary | ICD-10-CM | POA: Diagnosis not present

## 2017-06-28 DIAGNOSIS — E1159 Type 2 diabetes mellitus with other circulatory complications: Secondary | ICD-10-CM | POA: Diagnosis not present

## 2017-06-28 DIAGNOSIS — I5030 Unspecified diastolic (congestive) heart failure: Secondary | ICD-10-CM | POA: Diagnosis not present

## 2017-06-28 DIAGNOSIS — N189 Chronic kidney disease, unspecified: Secondary | ICD-10-CM | POA: Diagnosis not present

## 2017-06-28 DIAGNOSIS — I739 Peripheral vascular disease, unspecified: Secondary | ICD-10-CM | POA: Diagnosis not present

## 2017-07-01 DIAGNOSIS — N189 Chronic kidney disease, unspecified: Secondary | ICD-10-CM | POA: Diagnosis not present

## 2017-07-01 DIAGNOSIS — I739 Peripheral vascular disease, unspecified: Secondary | ICD-10-CM | POA: Diagnosis not present

## 2017-07-01 DIAGNOSIS — I25119 Atherosclerotic heart disease of native coronary artery with unspecified angina pectoris: Secondary | ICD-10-CM | POA: Diagnosis not present

## 2017-07-01 DIAGNOSIS — E1159 Type 2 diabetes mellitus with other circulatory complications: Secondary | ICD-10-CM | POA: Diagnosis not present

## 2017-07-01 DIAGNOSIS — I313 Pericardial effusion (noninflammatory): Secondary | ICD-10-CM | POA: Diagnosis not present

## 2017-07-01 DIAGNOSIS — I5030 Unspecified diastolic (congestive) heart failure: Secondary | ICD-10-CM | POA: Diagnosis not present

## 2017-07-06 DIAGNOSIS — E1159 Type 2 diabetes mellitus with other circulatory complications: Secondary | ICD-10-CM | POA: Diagnosis not present

## 2017-07-06 DIAGNOSIS — I739 Peripheral vascular disease, unspecified: Secondary | ICD-10-CM | POA: Diagnosis not present

## 2017-07-06 DIAGNOSIS — I25119 Atherosclerotic heart disease of native coronary artery with unspecified angina pectoris: Secondary | ICD-10-CM | POA: Diagnosis not present

## 2017-07-06 DIAGNOSIS — I5030 Unspecified diastolic (congestive) heart failure: Secondary | ICD-10-CM | POA: Diagnosis not present

## 2017-07-06 DIAGNOSIS — N189 Chronic kidney disease, unspecified: Secondary | ICD-10-CM | POA: Diagnosis not present

## 2017-07-06 DIAGNOSIS — I313 Pericardial effusion (noninflammatory): Secondary | ICD-10-CM | POA: Diagnosis not present

## 2017-07-08 DIAGNOSIS — E1159 Type 2 diabetes mellitus with other circulatory complications: Secondary | ICD-10-CM | POA: Diagnosis not present

## 2017-07-08 DIAGNOSIS — N189 Chronic kidney disease, unspecified: Secondary | ICD-10-CM | POA: Diagnosis not present

## 2017-07-08 DIAGNOSIS — I739 Peripheral vascular disease, unspecified: Secondary | ICD-10-CM | POA: Diagnosis not present

## 2017-07-08 DIAGNOSIS — I313 Pericardial effusion (noninflammatory): Secondary | ICD-10-CM | POA: Diagnosis not present

## 2017-07-08 DIAGNOSIS — I25119 Atherosclerotic heart disease of native coronary artery with unspecified angina pectoris: Secondary | ICD-10-CM | POA: Diagnosis not present

## 2017-07-08 DIAGNOSIS — I5030 Unspecified diastolic (congestive) heart failure: Secondary | ICD-10-CM | POA: Diagnosis not present

## 2017-07-10 DIAGNOSIS — N401 Enlarged prostate with lower urinary tract symptoms: Secondary | ICD-10-CM | POA: Diagnosis not present

## 2017-07-10 DIAGNOSIS — I4891 Unspecified atrial fibrillation: Secondary | ICD-10-CM | POA: Diagnosis not present

## 2017-07-10 DIAGNOSIS — M109 Gout, unspecified: Secondary | ICD-10-CM | POA: Diagnosis not present

## 2017-07-10 DIAGNOSIS — M1990 Unspecified osteoarthritis, unspecified site: Secondary | ICD-10-CM | POA: Diagnosis not present

## 2017-07-10 DIAGNOSIS — I313 Pericardial effusion (noninflammatory): Secondary | ICD-10-CM | POA: Diagnosis not present

## 2017-07-10 DIAGNOSIS — J301 Allergic rhinitis due to pollen: Secondary | ICD-10-CM | POA: Diagnosis not present

## 2017-07-10 DIAGNOSIS — E785 Hyperlipidemia, unspecified: Secondary | ICD-10-CM | POA: Diagnosis not present

## 2017-07-10 DIAGNOSIS — E1159 Type 2 diabetes mellitus with other circulatory complications: Secondary | ICD-10-CM | POA: Diagnosis not present

## 2017-07-10 DIAGNOSIS — I5030 Unspecified diastolic (congestive) heart failure: Secondary | ICD-10-CM | POA: Diagnosis not present

## 2017-07-10 DIAGNOSIS — I739 Peripheral vascular disease, unspecified: Secondary | ICD-10-CM | POA: Diagnosis not present

## 2017-07-10 DIAGNOSIS — N189 Chronic kidney disease, unspecified: Secondary | ICD-10-CM | POA: Diagnosis not present

## 2017-07-10 DIAGNOSIS — I25119 Atherosclerotic heart disease of native coronary artery with unspecified angina pectoris: Secondary | ICD-10-CM | POA: Diagnosis not present

## 2017-07-10 DIAGNOSIS — I10 Essential (primary) hypertension: Secondary | ICD-10-CM | POA: Diagnosis not present

## 2017-07-12 DIAGNOSIS — E1159 Type 2 diabetes mellitus with other circulatory complications: Secondary | ICD-10-CM | POA: Diagnosis not present

## 2017-07-12 DIAGNOSIS — I5030 Unspecified diastolic (congestive) heart failure: Secondary | ICD-10-CM | POA: Diagnosis not present

## 2017-07-12 DIAGNOSIS — N189 Chronic kidney disease, unspecified: Secondary | ICD-10-CM | POA: Diagnosis not present

## 2017-07-12 DIAGNOSIS — I313 Pericardial effusion (noninflammatory): Secondary | ICD-10-CM | POA: Diagnosis not present

## 2017-07-12 DIAGNOSIS — I25119 Atherosclerotic heart disease of native coronary artery with unspecified angina pectoris: Secondary | ICD-10-CM | POA: Diagnosis not present

## 2017-07-12 DIAGNOSIS — I739 Peripheral vascular disease, unspecified: Secondary | ICD-10-CM | POA: Diagnosis not present

## 2017-07-15 DIAGNOSIS — N189 Chronic kidney disease, unspecified: Secondary | ICD-10-CM | POA: Diagnosis not present

## 2017-07-15 DIAGNOSIS — I25119 Atherosclerotic heart disease of native coronary artery with unspecified angina pectoris: Secondary | ICD-10-CM | POA: Diagnosis not present

## 2017-07-15 DIAGNOSIS — I739 Peripheral vascular disease, unspecified: Secondary | ICD-10-CM | POA: Diagnosis not present

## 2017-07-15 DIAGNOSIS — I5030 Unspecified diastolic (congestive) heart failure: Secondary | ICD-10-CM | POA: Diagnosis not present

## 2017-07-15 DIAGNOSIS — I313 Pericardial effusion (noninflammatory): Secondary | ICD-10-CM | POA: Diagnosis not present

## 2017-07-15 DIAGNOSIS — E1159 Type 2 diabetes mellitus with other circulatory complications: Secondary | ICD-10-CM | POA: Diagnosis not present

## 2017-07-19 DIAGNOSIS — I313 Pericardial effusion (noninflammatory): Secondary | ICD-10-CM | POA: Diagnosis not present

## 2017-07-19 DIAGNOSIS — I25119 Atherosclerotic heart disease of native coronary artery with unspecified angina pectoris: Secondary | ICD-10-CM | POA: Diagnosis not present

## 2017-07-19 DIAGNOSIS — I5030 Unspecified diastolic (congestive) heart failure: Secondary | ICD-10-CM | POA: Diagnosis not present

## 2017-07-19 DIAGNOSIS — I739 Peripheral vascular disease, unspecified: Secondary | ICD-10-CM | POA: Diagnosis not present

## 2017-07-19 DIAGNOSIS — N189 Chronic kidney disease, unspecified: Secondary | ICD-10-CM | POA: Diagnosis not present

## 2017-07-19 DIAGNOSIS — E1159 Type 2 diabetes mellitus with other circulatory complications: Secondary | ICD-10-CM | POA: Diagnosis not present

## 2017-07-22 DIAGNOSIS — N189 Chronic kidney disease, unspecified: Secondary | ICD-10-CM | POA: Diagnosis not present

## 2017-07-22 DIAGNOSIS — I739 Peripheral vascular disease, unspecified: Secondary | ICD-10-CM | POA: Diagnosis not present

## 2017-07-22 DIAGNOSIS — I5030 Unspecified diastolic (congestive) heart failure: Secondary | ICD-10-CM | POA: Diagnosis not present

## 2017-07-22 DIAGNOSIS — I25119 Atherosclerotic heart disease of native coronary artery with unspecified angina pectoris: Secondary | ICD-10-CM | POA: Diagnosis not present

## 2017-07-22 DIAGNOSIS — I313 Pericardial effusion (noninflammatory): Secondary | ICD-10-CM | POA: Diagnosis not present

## 2017-07-22 DIAGNOSIS — E1159 Type 2 diabetes mellitus with other circulatory complications: Secondary | ICD-10-CM | POA: Diagnosis not present

## 2017-07-26 DIAGNOSIS — I5030 Unspecified diastolic (congestive) heart failure: Secondary | ICD-10-CM | POA: Diagnosis not present

## 2017-07-26 DIAGNOSIS — E1159 Type 2 diabetes mellitus with other circulatory complications: Secondary | ICD-10-CM | POA: Diagnosis not present

## 2017-07-26 DIAGNOSIS — I25119 Atherosclerotic heart disease of native coronary artery with unspecified angina pectoris: Secondary | ICD-10-CM | POA: Diagnosis not present

## 2017-07-26 DIAGNOSIS — N189 Chronic kidney disease, unspecified: Secondary | ICD-10-CM | POA: Diagnosis not present

## 2017-07-26 DIAGNOSIS — I739 Peripheral vascular disease, unspecified: Secondary | ICD-10-CM | POA: Diagnosis not present

## 2017-07-26 DIAGNOSIS — I313 Pericardial effusion (noninflammatory): Secondary | ICD-10-CM | POA: Diagnosis not present

## 2017-07-29 DIAGNOSIS — I739 Peripheral vascular disease, unspecified: Secondary | ICD-10-CM | POA: Diagnosis not present

## 2017-07-29 DIAGNOSIS — I313 Pericardial effusion (noninflammatory): Secondary | ICD-10-CM | POA: Diagnosis not present

## 2017-07-29 DIAGNOSIS — N189 Chronic kidney disease, unspecified: Secondary | ICD-10-CM | POA: Diagnosis not present

## 2017-07-29 DIAGNOSIS — I5030 Unspecified diastolic (congestive) heart failure: Secondary | ICD-10-CM | POA: Diagnosis not present

## 2017-07-29 DIAGNOSIS — I25119 Atherosclerotic heart disease of native coronary artery with unspecified angina pectoris: Secondary | ICD-10-CM | POA: Diagnosis not present

## 2017-07-29 DIAGNOSIS — E1159 Type 2 diabetes mellitus with other circulatory complications: Secondary | ICD-10-CM | POA: Diagnosis not present

## 2017-08-02 DIAGNOSIS — I25119 Atherosclerotic heart disease of native coronary artery with unspecified angina pectoris: Secondary | ICD-10-CM | POA: Diagnosis not present

## 2017-08-02 DIAGNOSIS — N189 Chronic kidney disease, unspecified: Secondary | ICD-10-CM | POA: Diagnosis not present

## 2017-08-02 DIAGNOSIS — I739 Peripheral vascular disease, unspecified: Secondary | ICD-10-CM | POA: Diagnosis not present

## 2017-08-02 DIAGNOSIS — E1159 Type 2 diabetes mellitus with other circulatory complications: Secondary | ICD-10-CM | POA: Diagnosis not present

## 2017-08-02 DIAGNOSIS — I5030 Unspecified diastolic (congestive) heart failure: Secondary | ICD-10-CM | POA: Diagnosis not present

## 2017-08-02 DIAGNOSIS — I313 Pericardial effusion (noninflammatory): Secondary | ICD-10-CM | POA: Diagnosis not present

## 2017-08-05 DIAGNOSIS — I313 Pericardial effusion (noninflammatory): Secondary | ICD-10-CM | POA: Diagnosis not present

## 2017-08-05 DIAGNOSIS — E1159 Type 2 diabetes mellitus with other circulatory complications: Secondary | ICD-10-CM | POA: Diagnosis not present

## 2017-08-05 DIAGNOSIS — N189 Chronic kidney disease, unspecified: Secondary | ICD-10-CM | POA: Diagnosis not present

## 2017-08-05 DIAGNOSIS — I739 Peripheral vascular disease, unspecified: Secondary | ICD-10-CM | POA: Diagnosis not present

## 2017-08-05 DIAGNOSIS — I5030 Unspecified diastolic (congestive) heart failure: Secondary | ICD-10-CM | POA: Diagnosis not present

## 2017-08-05 DIAGNOSIS — I25119 Atherosclerotic heart disease of native coronary artery with unspecified angina pectoris: Secondary | ICD-10-CM | POA: Diagnosis not present

## 2017-08-09 DIAGNOSIS — I25119 Atherosclerotic heart disease of native coronary artery with unspecified angina pectoris: Secondary | ICD-10-CM | POA: Diagnosis not present

## 2017-08-09 DIAGNOSIS — M109 Gout, unspecified: Secondary | ICD-10-CM | POA: Diagnosis not present

## 2017-08-09 DIAGNOSIS — I5030 Unspecified diastolic (congestive) heart failure: Secondary | ICD-10-CM | POA: Diagnosis not present

## 2017-08-09 DIAGNOSIS — N189 Chronic kidney disease, unspecified: Secondary | ICD-10-CM | POA: Diagnosis not present

## 2017-08-09 DIAGNOSIS — I4891 Unspecified atrial fibrillation: Secondary | ICD-10-CM | POA: Diagnosis not present

## 2017-08-09 DIAGNOSIS — I10 Essential (primary) hypertension: Secondary | ICD-10-CM | POA: Diagnosis not present

## 2017-08-09 DIAGNOSIS — N401 Enlarged prostate with lower urinary tract symptoms: Secondary | ICD-10-CM | POA: Diagnosis not present

## 2017-08-09 DIAGNOSIS — J301 Allergic rhinitis due to pollen: Secondary | ICD-10-CM | POA: Diagnosis not present

## 2017-08-09 DIAGNOSIS — E785 Hyperlipidemia, unspecified: Secondary | ICD-10-CM | POA: Diagnosis not present

## 2017-08-09 DIAGNOSIS — I739 Peripheral vascular disease, unspecified: Secondary | ICD-10-CM | POA: Diagnosis not present

## 2017-08-09 DIAGNOSIS — E1159 Type 2 diabetes mellitus with other circulatory complications: Secondary | ICD-10-CM | POA: Diagnosis not present

## 2017-08-09 DIAGNOSIS — I313 Pericardial effusion (noninflammatory): Secondary | ICD-10-CM | POA: Diagnosis not present

## 2017-08-09 DIAGNOSIS — M1990 Unspecified osteoarthritis, unspecified site: Secondary | ICD-10-CM | POA: Diagnosis not present

## 2017-08-11 ENCOUNTER — Telehealth: Payer: Self-pay | Admitting: Internal Medicine

## 2017-08-11 DIAGNOSIS — I5030 Unspecified diastolic (congestive) heart failure: Secondary | ICD-10-CM | POA: Diagnosis not present

## 2017-08-11 DIAGNOSIS — I739 Peripheral vascular disease, unspecified: Secondary | ICD-10-CM | POA: Diagnosis not present

## 2017-08-11 DIAGNOSIS — I313 Pericardial effusion (noninflammatory): Secondary | ICD-10-CM | POA: Diagnosis not present

## 2017-08-11 DIAGNOSIS — I25119 Atherosclerotic heart disease of native coronary artery with unspecified angina pectoris: Secondary | ICD-10-CM | POA: Diagnosis not present

## 2017-08-11 DIAGNOSIS — E1159 Type 2 diabetes mellitus with other circulatory complications: Secondary | ICD-10-CM | POA: Diagnosis not present

## 2017-08-11 DIAGNOSIS — N189 Chronic kidney disease, unspecified: Secondary | ICD-10-CM | POA: Diagnosis not present

## 2017-08-11 NOTE — Telephone Encounter (Deleted)
Copied from [place] [id number]. Topic: General - Other >> Aug 11, 2017 10:14 AM Cecelia Byars, NT wrote: Reason for CRM: Patty from hospice called and said the patient needs a verbal order for re certification  please call [phone]

## 2017-08-11 NOTE — Telephone Encounter (Signed)
Copied from [place] [id number]. Topic: General - Other >> Aug 11, 2017 10:25 AM Carolyn Stare wrote:  Luis Glenn with Hospice request  verbal orders for Hospice recertification for pt   [phone]

## 2017-08-11 NOTE — Telephone Encounter (Signed)
Ok to give verbal orders?

## 2017-08-11 NOTE — Telephone Encounter (Signed)
Notified Patti w/MD response.Marland KitchenJohny Glenn

## 2017-08-13 DIAGNOSIS — I313 Pericardial effusion (noninflammatory): Secondary | ICD-10-CM | POA: Diagnosis not present

## 2017-08-13 DIAGNOSIS — I739 Peripheral vascular disease, unspecified: Secondary | ICD-10-CM | POA: Diagnosis not present

## 2017-08-13 DIAGNOSIS — I5030 Unspecified diastolic (congestive) heart failure: Secondary | ICD-10-CM | POA: Diagnosis not present

## 2017-08-13 DIAGNOSIS — E1159 Type 2 diabetes mellitus with other circulatory complications: Secondary | ICD-10-CM | POA: Diagnosis not present

## 2017-08-13 DIAGNOSIS — I25119 Atherosclerotic heart disease of native coronary artery with unspecified angina pectoris: Secondary | ICD-10-CM | POA: Diagnosis not present

## 2017-08-13 DIAGNOSIS — N189 Chronic kidney disease, unspecified: Secondary | ICD-10-CM | POA: Diagnosis not present

## 2017-08-16 DIAGNOSIS — N189 Chronic kidney disease, unspecified: Secondary | ICD-10-CM | POA: Diagnosis not present

## 2017-08-16 DIAGNOSIS — I313 Pericardial effusion (noninflammatory): Secondary | ICD-10-CM | POA: Diagnosis not present

## 2017-08-16 DIAGNOSIS — I739 Peripheral vascular disease, unspecified: Secondary | ICD-10-CM | POA: Diagnosis not present

## 2017-08-16 DIAGNOSIS — I5030 Unspecified diastolic (congestive) heart failure: Secondary | ICD-10-CM | POA: Diagnosis not present

## 2017-08-16 DIAGNOSIS — I25119 Atherosclerotic heart disease of native coronary artery with unspecified angina pectoris: Secondary | ICD-10-CM | POA: Diagnosis not present

## 2017-08-16 DIAGNOSIS — E1159 Type 2 diabetes mellitus with other circulatory complications: Secondary | ICD-10-CM | POA: Diagnosis not present

## 2017-08-19 DIAGNOSIS — I25119 Atherosclerotic heart disease of native coronary artery with unspecified angina pectoris: Secondary | ICD-10-CM | POA: Diagnosis not present

## 2017-08-19 DIAGNOSIS — E1159 Type 2 diabetes mellitus with other circulatory complications: Secondary | ICD-10-CM | POA: Diagnosis not present

## 2017-08-19 DIAGNOSIS — I739 Peripheral vascular disease, unspecified: Secondary | ICD-10-CM | POA: Diagnosis not present

## 2017-08-19 DIAGNOSIS — I5030 Unspecified diastolic (congestive) heart failure: Secondary | ICD-10-CM | POA: Diagnosis not present

## 2017-08-19 DIAGNOSIS — N189 Chronic kidney disease, unspecified: Secondary | ICD-10-CM | POA: Diagnosis not present

## 2017-08-19 DIAGNOSIS — I313 Pericardial effusion (noninflammatory): Secondary | ICD-10-CM | POA: Diagnosis not present

## 2017-08-23 DIAGNOSIS — I25119 Atherosclerotic heart disease of native coronary artery with unspecified angina pectoris: Secondary | ICD-10-CM | POA: Diagnosis not present

## 2017-08-23 DIAGNOSIS — N189 Chronic kidney disease, unspecified: Secondary | ICD-10-CM | POA: Diagnosis not present

## 2017-08-23 DIAGNOSIS — E1159 Type 2 diabetes mellitus with other circulatory complications: Secondary | ICD-10-CM | POA: Diagnosis not present

## 2017-08-23 DIAGNOSIS — I5030 Unspecified diastolic (congestive) heart failure: Secondary | ICD-10-CM | POA: Diagnosis not present

## 2017-08-23 DIAGNOSIS — I739 Peripheral vascular disease, unspecified: Secondary | ICD-10-CM | POA: Diagnosis not present

## 2017-08-23 DIAGNOSIS — I313 Pericardial effusion (noninflammatory): Secondary | ICD-10-CM | POA: Diagnosis not present

## 2017-08-24 DIAGNOSIS — I739 Peripheral vascular disease, unspecified: Secondary | ICD-10-CM | POA: Diagnosis not present

## 2017-08-24 DIAGNOSIS — E1159 Type 2 diabetes mellitus with other circulatory complications: Secondary | ICD-10-CM | POA: Diagnosis not present

## 2017-08-24 DIAGNOSIS — I25119 Atherosclerotic heart disease of native coronary artery with unspecified angina pectoris: Secondary | ICD-10-CM | POA: Diagnosis not present

## 2017-08-24 DIAGNOSIS — I5030 Unspecified diastolic (congestive) heart failure: Secondary | ICD-10-CM | POA: Diagnosis not present

## 2017-08-24 DIAGNOSIS — I313 Pericardial effusion (noninflammatory): Secondary | ICD-10-CM | POA: Diagnosis not present

## 2017-08-24 DIAGNOSIS — N189 Chronic kidney disease, unspecified: Secondary | ICD-10-CM | POA: Diagnosis not present

## 2017-08-26 DIAGNOSIS — E1159 Type 2 diabetes mellitus with other circulatory complications: Secondary | ICD-10-CM | POA: Diagnosis not present

## 2017-08-26 DIAGNOSIS — I5030 Unspecified diastolic (congestive) heart failure: Secondary | ICD-10-CM | POA: Diagnosis not present

## 2017-08-26 DIAGNOSIS — I313 Pericardial effusion (noninflammatory): Secondary | ICD-10-CM | POA: Diagnosis not present

## 2017-08-26 DIAGNOSIS — I739 Peripheral vascular disease, unspecified: Secondary | ICD-10-CM | POA: Diagnosis not present

## 2017-08-26 DIAGNOSIS — N189 Chronic kidney disease, unspecified: Secondary | ICD-10-CM | POA: Diagnosis not present

## 2017-08-26 DIAGNOSIS — I25119 Atherosclerotic heart disease of native coronary artery with unspecified angina pectoris: Secondary | ICD-10-CM | POA: Diagnosis not present

## 2017-08-30 DIAGNOSIS — I25119 Atherosclerotic heart disease of native coronary artery with unspecified angina pectoris: Secondary | ICD-10-CM | POA: Diagnosis not present

## 2017-08-30 DIAGNOSIS — I313 Pericardial effusion (noninflammatory): Secondary | ICD-10-CM | POA: Diagnosis not present

## 2017-08-30 DIAGNOSIS — I5030 Unspecified diastolic (congestive) heart failure: Secondary | ICD-10-CM | POA: Diagnosis not present

## 2017-08-30 DIAGNOSIS — I739 Peripheral vascular disease, unspecified: Secondary | ICD-10-CM | POA: Diagnosis not present

## 2017-08-30 DIAGNOSIS — N189 Chronic kidney disease, unspecified: Secondary | ICD-10-CM | POA: Diagnosis not present

## 2017-08-30 DIAGNOSIS — E1159 Type 2 diabetes mellitus with other circulatory complications: Secondary | ICD-10-CM | POA: Diagnosis not present

## 2017-09-02 DIAGNOSIS — E1159 Type 2 diabetes mellitus with other circulatory complications: Secondary | ICD-10-CM | POA: Diagnosis not present

## 2017-09-02 DIAGNOSIS — I739 Peripheral vascular disease, unspecified: Secondary | ICD-10-CM | POA: Diagnosis not present

## 2017-09-02 DIAGNOSIS — N189 Chronic kidney disease, unspecified: Secondary | ICD-10-CM | POA: Diagnosis not present

## 2017-09-02 DIAGNOSIS — I313 Pericardial effusion (noninflammatory): Secondary | ICD-10-CM | POA: Diagnosis not present

## 2017-09-02 DIAGNOSIS — I5030 Unspecified diastolic (congestive) heart failure: Secondary | ICD-10-CM | POA: Diagnosis not present

## 2017-09-02 DIAGNOSIS — I25119 Atherosclerotic heart disease of native coronary artery with unspecified angina pectoris: Secondary | ICD-10-CM | POA: Diagnosis not present

## 2017-09-06 DIAGNOSIS — I739 Peripheral vascular disease, unspecified: Secondary | ICD-10-CM | POA: Diagnosis not present

## 2017-09-06 DIAGNOSIS — I25119 Atherosclerotic heart disease of native coronary artery with unspecified angina pectoris: Secondary | ICD-10-CM | POA: Diagnosis not present

## 2017-09-06 DIAGNOSIS — I313 Pericardial effusion (noninflammatory): Secondary | ICD-10-CM | POA: Diagnosis not present

## 2017-09-06 DIAGNOSIS — I5030 Unspecified diastolic (congestive) heart failure: Secondary | ICD-10-CM | POA: Diagnosis not present

## 2017-09-06 DIAGNOSIS — N189 Chronic kidney disease, unspecified: Secondary | ICD-10-CM | POA: Diagnosis not present

## 2017-09-06 DIAGNOSIS — E1159 Type 2 diabetes mellitus with other circulatory complications: Secondary | ICD-10-CM | POA: Diagnosis not present

## 2017-09-09 ENCOUNTER — Other Ambulatory Visit: Payer: Self-pay | Admitting: Internal Medicine

## 2017-09-09 DIAGNOSIS — I10 Essential (primary) hypertension: Secondary | ICD-10-CM | POA: Diagnosis not present

## 2017-09-09 DIAGNOSIS — M109 Gout, unspecified: Secondary | ICD-10-CM | POA: Diagnosis not present

## 2017-09-09 DIAGNOSIS — I5030 Unspecified diastolic (congestive) heart failure: Secondary | ICD-10-CM | POA: Diagnosis not present

## 2017-09-09 DIAGNOSIS — E785 Hyperlipidemia, unspecified: Secondary | ICD-10-CM | POA: Diagnosis not present

## 2017-09-09 DIAGNOSIS — N189 Chronic kidney disease, unspecified: Secondary | ICD-10-CM | POA: Diagnosis not present

## 2017-09-09 DIAGNOSIS — E1159 Type 2 diabetes mellitus with other circulatory complications: Secondary | ICD-10-CM | POA: Diagnosis not present

## 2017-09-09 DIAGNOSIS — I25119 Atherosclerotic heart disease of native coronary artery with unspecified angina pectoris: Secondary | ICD-10-CM | POA: Diagnosis not present

## 2017-09-09 DIAGNOSIS — M1990 Unspecified osteoarthritis, unspecified site: Secondary | ICD-10-CM | POA: Diagnosis not present

## 2017-09-09 DIAGNOSIS — N401 Enlarged prostate with lower urinary tract symptoms: Secondary | ICD-10-CM | POA: Diagnosis not present

## 2017-09-09 DIAGNOSIS — I313 Pericardial effusion (noninflammatory): Secondary | ICD-10-CM | POA: Diagnosis not present

## 2017-09-09 DIAGNOSIS — J301 Allergic rhinitis due to pollen: Secondary | ICD-10-CM | POA: Diagnosis not present

## 2017-09-09 DIAGNOSIS — I739 Peripheral vascular disease, unspecified: Secondary | ICD-10-CM | POA: Diagnosis not present

## 2017-09-09 DIAGNOSIS — I4891 Unspecified atrial fibrillation: Secondary | ICD-10-CM | POA: Diagnosis not present

## 2017-09-09 NOTE — Telephone Encounter (Signed)
Pt is being followed by Hospice, last OV with [place] 04/18/15. Are you okay with continuing to fill without OV?

## 2017-09-13 DIAGNOSIS — E1159 Type 2 diabetes mellitus with other circulatory complications: Secondary | ICD-10-CM | POA: Diagnosis not present

## 2017-09-13 DIAGNOSIS — I739 Peripheral vascular disease, unspecified: Secondary | ICD-10-CM | POA: Diagnosis not present

## 2017-09-13 DIAGNOSIS — I25119 Atherosclerotic heart disease of native coronary artery with unspecified angina pectoris: Secondary | ICD-10-CM | POA: Diagnosis not present

## 2017-09-13 DIAGNOSIS — I313 Pericardial effusion (noninflammatory): Secondary | ICD-10-CM | POA: Diagnosis not present

## 2017-09-13 DIAGNOSIS — I5030 Unspecified diastolic (congestive) heart failure: Secondary | ICD-10-CM | POA: Diagnosis not present

## 2017-09-13 DIAGNOSIS — N189 Chronic kidney disease, unspecified: Secondary | ICD-10-CM | POA: Diagnosis not present

## 2017-09-16 DIAGNOSIS — N189 Chronic kidney disease, unspecified: Secondary | ICD-10-CM | POA: Diagnosis not present

## 2017-09-16 DIAGNOSIS — M109 Gout, unspecified: Secondary | ICD-10-CM | POA: Diagnosis not present

## 2017-09-16 DIAGNOSIS — I4891 Unspecified atrial fibrillation: Secondary | ICD-10-CM | POA: Diagnosis not present

## 2017-09-16 DIAGNOSIS — I5030 Unspecified diastolic (congestive) heart failure: Secondary | ICD-10-CM | POA: Diagnosis not present

## 2017-09-16 DIAGNOSIS — I10 Essential (primary) hypertension: Secondary | ICD-10-CM | POA: Diagnosis not present

## 2017-09-16 DIAGNOSIS — E1159 Type 2 diabetes mellitus with other circulatory complications: Secondary | ICD-10-CM | POA: Diagnosis not present

## 2017-09-16 DIAGNOSIS — J301 Allergic rhinitis due to pollen: Secondary | ICD-10-CM | POA: Diagnosis not present

## 2017-09-16 DIAGNOSIS — I25119 Atherosclerotic heart disease of native coronary artery with unspecified angina pectoris: Secondary | ICD-10-CM | POA: Diagnosis not present

## 2017-09-16 DIAGNOSIS — M1990 Unspecified osteoarthritis, unspecified site: Secondary | ICD-10-CM

## 2017-09-16 DIAGNOSIS — E785 Hyperlipidemia, unspecified: Secondary | ICD-10-CM | POA: Diagnosis not present

## 2017-09-16 DIAGNOSIS — N401 Enlarged prostate with lower urinary tract symptoms: Secondary | ICD-10-CM | POA: Diagnosis not present

## 2017-09-16 DIAGNOSIS — I313 Pericardial effusion (noninflammatory): Secondary | ICD-10-CM | POA: Diagnosis not present

## 2017-09-16 DIAGNOSIS — I739 Peripheral vascular disease, unspecified: Secondary | ICD-10-CM | POA: Diagnosis not present

## 2017-09-20 DIAGNOSIS — I739 Peripheral vascular disease, unspecified: Secondary | ICD-10-CM | POA: Diagnosis not present

## 2017-09-20 DIAGNOSIS — E1159 Type 2 diabetes mellitus with other circulatory complications: Secondary | ICD-10-CM | POA: Diagnosis not present

## 2017-09-20 DIAGNOSIS — I313 Pericardial effusion (noninflammatory): Secondary | ICD-10-CM | POA: Diagnosis not present

## 2017-09-20 DIAGNOSIS — I5030 Unspecified diastolic (congestive) heart failure: Secondary | ICD-10-CM | POA: Diagnosis not present

## 2017-09-20 DIAGNOSIS — I25119 Atherosclerotic heart disease of native coronary artery with unspecified angina pectoris: Secondary | ICD-10-CM | POA: Diagnosis not present

## 2017-09-20 DIAGNOSIS — N189 Chronic kidney disease, unspecified: Secondary | ICD-10-CM | POA: Diagnosis not present

## 2017-09-23 DIAGNOSIS — E1159 Type 2 diabetes mellitus with other circulatory complications: Secondary | ICD-10-CM | POA: Diagnosis not present

## 2017-09-23 DIAGNOSIS — I5030 Unspecified diastolic (congestive) heart failure: Secondary | ICD-10-CM | POA: Diagnosis not present

## 2017-09-23 DIAGNOSIS — N189 Chronic kidney disease, unspecified: Secondary | ICD-10-CM | POA: Diagnosis not present

## 2017-09-23 DIAGNOSIS — I25119 Atherosclerotic heart disease of native coronary artery with unspecified angina pectoris: Secondary | ICD-10-CM | POA: Diagnosis not present

## 2017-09-23 DIAGNOSIS — I739 Peripheral vascular disease, unspecified: Secondary | ICD-10-CM | POA: Diagnosis not present

## 2017-09-23 DIAGNOSIS — I313 Pericardial effusion (noninflammatory): Secondary | ICD-10-CM | POA: Diagnosis not present

## 2017-09-27 DIAGNOSIS — N189 Chronic kidney disease, unspecified: Secondary | ICD-10-CM | POA: Diagnosis not present

## 2017-09-27 DIAGNOSIS — I739 Peripheral vascular disease, unspecified: Secondary | ICD-10-CM | POA: Diagnosis not present

## 2017-09-27 DIAGNOSIS — E1159 Type 2 diabetes mellitus with other circulatory complications: Secondary | ICD-10-CM | POA: Diagnosis not present

## 2017-09-27 DIAGNOSIS — I5030 Unspecified diastolic (congestive) heart failure: Secondary | ICD-10-CM | POA: Diagnosis not present

## 2017-09-27 DIAGNOSIS — I313 Pericardial effusion (noninflammatory): Secondary | ICD-10-CM | POA: Diagnosis not present

## 2017-09-27 DIAGNOSIS — I25119 Atherosclerotic heart disease of native coronary artery with unspecified angina pectoris: Secondary | ICD-10-CM | POA: Diagnosis not present

## 2017-09-30 DIAGNOSIS — I5030 Unspecified diastolic (congestive) heart failure: Secondary | ICD-10-CM | POA: Diagnosis not present

## 2017-09-30 DIAGNOSIS — N189 Chronic kidney disease, unspecified: Secondary | ICD-10-CM | POA: Diagnosis not present

## 2017-09-30 DIAGNOSIS — E1159 Type 2 diabetes mellitus with other circulatory complications: Secondary | ICD-10-CM | POA: Diagnosis not present

## 2017-09-30 DIAGNOSIS — I25119 Atherosclerotic heart disease of native coronary artery with unspecified angina pectoris: Secondary | ICD-10-CM | POA: Diagnosis not present

## 2017-09-30 DIAGNOSIS — I313 Pericardial effusion (noninflammatory): Secondary | ICD-10-CM | POA: Diagnosis not present

## 2017-09-30 DIAGNOSIS — I739 Peripheral vascular disease, unspecified: Secondary | ICD-10-CM | POA: Diagnosis not present

## 2017-10-04 DIAGNOSIS — I739 Peripheral vascular disease, unspecified: Secondary | ICD-10-CM | POA: Diagnosis not present

## 2017-10-04 DIAGNOSIS — I5030 Unspecified diastolic (congestive) heart failure: Secondary | ICD-10-CM | POA: Diagnosis not present

## 2017-10-04 DIAGNOSIS — I313 Pericardial effusion (noninflammatory): Secondary | ICD-10-CM | POA: Diagnosis not present

## 2017-10-04 DIAGNOSIS — N189 Chronic kidney disease, unspecified: Secondary | ICD-10-CM | POA: Diagnosis not present

## 2017-10-04 DIAGNOSIS — I25119 Atherosclerotic heart disease of native coronary artery with unspecified angina pectoris: Secondary | ICD-10-CM | POA: Diagnosis not present

## 2017-10-04 DIAGNOSIS — E1159 Type 2 diabetes mellitus with other circulatory complications: Secondary | ICD-10-CM | POA: Diagnosis not present

## 2017-10-06 DIAGNOSIS — I313 Pericardial effusion (noninflammatory): Secondary | ICD-10-CM | POA: Diagnosis not present

## 2017-10-06 DIAGNOSIS — N189 Chronic kidney disease, unspecified: Secondary | ICD-10-CM | POA: Diagnosis not present

## 2017-10-06 DIAGNOSIS — I739 Peripheral vascular disease, unspecified: Secondary | ICD-10-CM | POA: Diagnosis not present

## 2017-10-06 DIAGNOSIS — E1159 Type 2 diabetes mellitus with other circulatory complications: Secondary | ICD-10-CM | POA: Diagnosis not present

## 2017-10-06 DIAGNOSIS — I25119 Atherosclerotic heart disease of native coronary artery with unspecified angina pectoris: Secondary | ICD-10-CM | POA: Diagnosis not present

## 2017-10-06 DIAGNOSIS — I5030 Unspecified diastolic (congestive) heart failure: Secondary | ICD-10-CM | POA: Diagnosis not present

## 2017-10-07 DIAGNOSIS — I313 Pericardial effusion (noninflammatory): Secondary | ICD-10-CM | POA: Diagnosis not present

## 2017-10-07 DIAGNOSIS — N189 Chronic kidney disease, unspecified: Secondary | ICD-10-CM | POA: Diagnosis not present

## 2017-10-07 DIAGNOSIS — E1159 Type 2 diabetes mellitus with other circulatory complications: Secondary | ICD-10-CM | POA: Diagnosis not present

## 2017-10-07 DIAGNOSIS — I5030 Unspecified diastolic (congestive) heart failure: Secondary | ICD-10-CM | POA: Diagnosis not present

## 2017-10-07 DIAGNOSIS — I739 Peripheral vascular disease, unspecified: Secondary | ICD-10-CM | POA: Diagnosis not present

## 2017-10-07 DIAGNOSIS — I25119 Atherosclerotic heart disease of native coronary artery with unspecified angina pectoris: Secondary | ICD-10-CM | POA: Diagnosis not present

## 2017-10-10 DIAGNOSIS — E1159 Type 2 diabetes mellitus with other circulatory complications: Secondary | ICD-10-CM | POA: Diagnosis not present

## 2017-10-10 DIAGNOSIS — N401 Enlarged prostate with lower urinary tract symptoms: Secondary | ICD-10-CM | POA: Diagnosis not present

## 2017-10-10 DIAGNOSIS — I25119 Atherosclerotic heart disease of native coronary artery with unspecified angina pectoris: Secondary | ICD-10-CM | POA: Diagnosis not present

## 2017-10-10 DIAGNOSIS — I4891 Unspecified atrial fibrillation: Secondary | ICD-10-CM | POA: Diagnosis not present

## 2017-10-10 DIAGNOSIS — I739 Peripheral vascular disease, unspecified: Secondary | ICD-10-CM | POA: Diagnosis not present

## 2017-10-10 DIAGNOSIS — I5030 Unspecified diastolic (congestive) heart failure: Secondary | ICD-10-CM | POA: Diagnosis not present

## 2017-10-10 DIAGNOSIS — I313 Pericardial effusion (noninflammatory): Secondary | ICD-10-CM | POA: Diagnosis not present

## 2017-10-10 DIAGNOSIS — N189 Chronic kidney disease, unspecified: Secondary | ICD-10-CM | POA: Diagnosis not present

## 2017-10-10 DIAGNOSIS — M1990 Unspecified osteoarthritis, unspecified site: Secondary | ICD-10-CM | POA: Diagnosis not present

## 2017-10-10 DIAGNOSIS — M109 Gout, unspecified: Secondary | ICD-10-CM | POA: Diagnosis not present

## 2017-10-10 DIAGNOSIS — E785 Hyperlipidemia, unspecified: Secondary | ICD-10-CM | POA: Diagnosis not present

## 2017-10-10 DIAGNOSIS — J301 Allergic rhinitis due to pollen: Secondary | ICD-10-CM | POA: Diagnosis not present

## 2017-10-10 DIAGNOSIS — I10 Essential (primary) hypertension: Secondary | ICD-10-CM | POA: Diagnosis not present

## 2017-10-14 DIAGNOSIS — E1159 Type 2 diabetes mellitus with other circulatory complications: Secondary | ICD-10-CM | POA: Diagnosis not present

## 2017-10-14 DIAGNOSIS — I313 Pericardial effusion (noninflammatory): Secondary | ICD-10-CM | POA: Diagnosis not present

## 2017-10-14 DIAGNOSIS — I5030 Unspecified diastolic (congestive) heart failure: Secondary | ICD-10-CM | POA: Diagnosis not present

## 2017-10-14 DIAGNOSIS — N189 Chronic kidney disease, unspecified: Secondary | ICD-10-CM | POA: Diagnosis not present

## 2017-10-14 DIAGNOSIS — I739 Peripheral vascular disease, unspecified: Secondary | ICD-10-CM | POA: Diagnosis not present

## 2017-10-14 DIAGNOSIS — I25119 Atherosclerotic heart disease of native coronary artery with unspecified angina pectoris: Secondary | ICD-10-CM | POA: Diagnosis not present

## 2017-10-15 ENCOUNTER — Telehealth: Payer: Self-pay | Admitting: Internal Medicine

## 2017-10-15 NOTE — Telephone Encounter (Signed)
Copied from [place] [id number]. Topic: General - Other >> Oct 15, 2017  9:14 AM Margot Ables wrote: Reason for CRM: requesting VO for continuation of care for 9/26-11/4. Ok to leave detailed msg at [phone] x [phone]

## 2017-10-15 NOTE — Telephone Encounter (Signed)
LVM with verbal orders per provider.

## 2017-10-18 DIAGNOSIS — I5030 Unspecified diastolic (congestive) heart failure: Secondary | ICD-10-CM | POA: Diagnosis not present

## 2017-10-18 DIAGNOSIS — I25119 Atherosclerotic heart disease of native coronary artery with unspecified angina pectoris: Secondary | ICD-10-CM | POA: Diagnosis not present

## 2017-10-18 DIAGNOSIS — E1159 Type 2 diabetes mellitus with other circulatory complications: Secondary | ICD-10-CM | POA: Diagnosis not present

## 2017-10-18 DIAGNOSIS — N189 Chronic kidney disease, unspecified: Secondary | ICD-10-CM | POA: Diagnosis not present

## 2017-10-18 DIAGNOSIS — I313 Pericardial effusion (noninflammatory): Secondary | ICD-10-CM | POA: Diagnosis not present

## 2017-10-18 DIAGNOSIS — I739 Peripheral vascular disease, unspecified: Secondary | ICD-10-CM | POA: Diagnosis not present

## 2017-10-21 DIAGNOSIS — N189 Chronic kidney disease, unspecified: Secondary | ICD-10-CM | POA: Diagnosis not present

## 2017-10-21 DIAGNOSIS — E1159 Type 2 diabetes mellitus with other circulatory complications: Secondary | ICD-10-CM | POA: Diagnosis not present

## 2017-10-21 DIAGNOSIS — I313 Pericardial effusion (noninflammatory): Secondary | ICD-10-CM | POA: Diagnosis not present

## 2017-10-21 DIAGNOSIS — I25119 Atherosclerotic heart disease of native coronary artery with unspecified angina pectoris: Secondary | ICD-10-CM | POA: Diagnosis not present

## 2017-10-21 DIAGNOSIS — I739 Peripheral vascular disease, unspecified: Secondary | ICD-10-CM | POA: Diagnosis not present

## 2017-10-21 DIAGNOSIS — I5030 Unspecified diastolic (congestive) heart failure: Secondary | ICD-10-CM | POA: Diagnosis not present

## 2017-10-25 DIAGNOSIS — N189 Chronic kidney disease, unspecified: Secondary | ICD-10-CM | POA: Diagnosis not present

## 2017-10-25 DIAGNOSIS — I25119 Atherosclerotic heart disease of native coronary artery with unspecified angina pectoris: Secondary | ICD-10-CM | POA: Diagnosis not present

## 2017-10-25 DIAGNOSIS — I5030 Unspecified diastolic (congestive) heart failure: Secondary | ICD-10-CM | POA: Diagnosis not present

## 2017-10-25 DIAGNOSIS — I313 Pericardial effusion (noninflammatory): Secondary | ICD-10-CM | POA: Diagnosis not present

## 2017-10-25 DIAGNOSIS — I739 Peripheral vascular disease, unspecified: Secondary | ICD-10-CM | POA: Diagnosis not present

## 2017-10-25 DIAGNOSIS — E1159 Type 2 diabetes mellitus with other circulatory complications: Secondary | ICD-10-CM | POA: Diagnosis not present

## 2017-10-28 DIAGNOSIS — I739 Peripheral vascular disease, unspecified: Secondary | ICD-10-CM | POA: Diagnosis not present

## 2017-10-28 DIAGNOSIS — N189 Chronic kidney disease, unspecified: Secondary | ICD-10-CM | POA: Diagnosis not present

## 2017-10-28 DIAGNOSIS — I5030 Unspecified diastolic (congestive) heart failure: Secondary | ICD-10-CM | POA: Diagnosis not present

## 2017-10-28 DIAGNOSIS — E1159 Type 2 diabetes mellitus with other circulatory complications: Secondary | ICD-10-CM | POA: Diagnosis not present

## 2017-10-28 DIAGNOSIS — I25119 Atherosclerotic heart disease of native coronary artery with unspecified angina pectoris: Secondary | ICD-10-CM | POA: Diagnosis not present

## 2017-10-28 DIAGNOSIS — I313 Pericardial effusion (noninflammatory): Secondary | ICD-10-CM | POA: Diagnosis not present

## 2017-11-01 DIAGNOSIS — I313 Pericardial effusion (noninflammatory): Secondary | ICD-10-CM | POA: Diagnosis not present

## 2017-11-01 DIAGNOSIS — E1159 Type 2 diabetes mellitus with other circulatory complications: Secondary | ICD-10-CM | POA: Diagnosis not present

## 2017-11-01 DIAGNOSIS — I25119 Atherosclerotic heart disease of native coronary artery with unspecified angina pectoris: Secondary | ICD-10-CM | POA: Diagnosis not present

## 2017-11-01 DIAGNOSIS — N189 Chronic kidney disease, unspecified: Secondary | ICD-10-CM | POA: Diagnosis not present

## 2017-11-01 DIAGNOSIS — I5030 Unspecified diastolic (congestive) heart failure: Secondary | ICD-10-CM | POA: Diagnosis not present

## 2017-11-01 DIAGNOSIS — I739 Peripheral vascular disease, unspecified: Secondary | ICD-10-CM | POA: Diagnosis not present

## 2017-11-04 DIAGNOSIS — N189 Chronic kidney disease, unspecified: Secondary | ICD-10-CM | POA: Diagnosis not present

## 2017-11-04 DIAGNOSIS — I5030 Unspecified diastolic (congestive) heart failure: Secondary | ICD-10-CM | POA: Diagnosis not present

## 2017-11-04 DIAGNOSIS — I25119 Atherosclerotic heart disease of native coronary artery with unspecified angina pectoris: Secondary | ICD-10-CM | POA: Diagnosis not present

## 2017-11-04 DIAGNOSIS — I739 Peripheral vascular disease, unspecified: Secondary | ICD-10-CM | POA: Diagnosis not present

## 2017-11-04 DIAGNOSIS — I313 Pericardial effusion (noninflammatory): Secondary | ICD-10-CM | POA: Diagnosis not present

## 2017-11-04 DIAGNOSIS — E1159 Type 2 diabetes mellitus with other circulatory complications: Secondary | ICD-10-CM | POA: Diagnosis not present

## 2017-11-08 DIAGNOSIS — I313 Pericardial effusion (noninflammatory): Secondary | ICD-10-CM | POA: Diagnosis not present

## 2017-11-08 DIAGNOSIS — I739 Peripheral vascular disease, unspecified: Secondary | ICD-10-CM | POA: Diagnosis not present

## 2017-11-08 DIAGNOSIS — I5030 Unspecified diastolic (congestive) heart failure: Secondary | ICD-10-CM | POA: Diagnosis not present

## 2017-11-08 DIAGNOSIS — N189 Chronic kidney disease, unspecified: Secondary | ICD-10-CM | POA: Diagnosis not present

## 2017-11-08 DIAGNOSIS — E1159 Type 2 diabetes mellitus with other circulatory complications: Secondary | ICD-10-CM | POA: Diagnosis not present

## 2017-11-08 DIAGNOSIS — I25119 Atherosclerotic heart disease of native coronary artery with unspecified angina pectoris: Secondary | ICD-10-CM | POA: Diagnosis not present

## 2017-11-09 DIAGNOSIS — J301 Allergic rhinitis due to pollen: Secondary | ICD-10-CM | POA: Diagnosis not present

## 2017-11-09 DIAGNOSIS — I313 Pericardial effusion (noninflammatory): Secondary | ICD-10-CM | POA: Diagnosis not present

## 2017-11-09 DIAGNOSIS — I5030 Unspecified diastolic (congestive) heart failure: Secondary | ICD-10-CM | POA: Diagnosis not present

## 2017-11-09 DIAGNOSIS — N189 Chronic kidney disease, unspecified: Secondary | ICD-10-CM | POA: Diagnosis not present

## 2017-11-09 DIAGNOSIS — M1990 Unspecified osteoarthritis, unspecified site: Secondary | ICD-10-CM | POA: Diagnosis not present

## 2017-11-09 DIAGNOSIS — E785 Hyperlipidemia, unspecified: Secondary | ICD-10-CM | POA: Diagnosis not present

## 2017-11-09 DIAGNOSIS — M109 Gout, unspecified: Secondary | ICD-10-CM | POA: Diagnosis not present

## 2017-11-09 DIAGNOSIS — I4891 Unspecified atrial fibrillation: Secondary | ICD-10-CM | POA: Diagnosis not present

## 2017-11-09 DIAGNOSIS — E1159 Type 2 diabetes mellitus with other circulatory complications: Secondary | ICD-10-CM | POA: Diagnosis not present

## 2017-11-09 DIAGNOSIS — N401 Enlarged prostate with lower urinary tract symptoms: Secondary | ICD-10-CM | POA: Diagnosis not present

## 2017-11-09 DIAGNOSIS — I25119 Atherosclerotic heart disease of native coronary artery with unspecified angina pectoris: Secondary | ICD-10-CM | POA: Diagnosis not present

## 2017-11-09 DIAGNOSIS — I739 Peripheral vascular disease, unspecified: Secondary | ICD-10-CM | POA: Diagnosis not present

## 2017-11-09 DIAGNOSIS — I10 Essential (primary) hypertension: Secondary | ICD-10-CM | POA: Diagnosis not present

## 2017-11-11 DIAGNOSIS — I739 Peripheral vascular disease, unspecified: Secondary | ICD-10-CM | POA: Diagnosis not present

## 2017-11-11 DIAGNOSIS — I313 Pericardial effusion (noninflammatory): Secondary | ICD-10-CM | POA: Diagnosis not present

## 2017-11-11 DIAGNOSIS — E1159 Type 2 diabetes mellitus with other circulatory complications: Secondary | ICD-10-CM | POA: Diagnosis not present

## 2017-11-11 DIAGNOSIS — N189 Chronic kidney disease, unspecified: Secondary | ICD-10-CM | POA: Diagnosis not present

## 2017-11-11 DIAGNOSIS — I25119 Atherosclerotic heart disease of native coronary artery with unspecified angina pectoris: Secondary | ICD-10-CM | POA: Diagnosis not present

## 2017-11-11 DIAGNOSIS — I5030 Unspecified diastolic (congestive) heart failure: Secondary | ICD-10-CM | POA: Diagnosis not present

## 2017-11-15 DIAGNOSIS — I25119 Atherosclerotic heart disease of native coronary artery with unspecified angina pectoris: Secondary | ICD-10-CM | POA: Diagnosis not present

## 2017-11-15 DIAGNOSIS — I313 Pericardial effusion (noninflammatory): Secondary | ICD-10-CM | POA: Diagnosis not present

## 2017-11-15 DIAGNOSIS — E1159 Type 2 diabetes mellitus with other circulatory complications: Secondary | ICD-10-CM | POA: Diagnosis not present

## 2017-11-15 DIAGNOSIS — I739 Peripheral vascular disease, unspecified: Secondary | ICD-10-CM | POA: Diagnosis not present

## 2017-11-15 DIAGNOSIS — I5030 Unspecified diastolic (congestive) heart failure: Secondary | ICD-10-CM | POA: Diagnosis not present

## 2017-11-15 DIAGNOSIS — N189 Chronic kidney disease, unspecified: Secondary | ICD-10-CM | POA: Diagnosis not present

## 2017-11-18 DIAGNOSIS — I25119 Atherosclerotic heart disease of native coronary artery with unspecified angina pectoris: Secondary | ICD-10-CM | POA: Diagnosis not present

## 2017-11-18 DIAGNOSIS — I313 Pericardial effusion (noninflammatory): Secondary | ICD-10-CM | POA: Diagnosis not present

## 2017-11-18 DIAGNOSIS — E1159 Type 2 diabetes mellitus with other circulatory complications: Secondary | ICD-10-CM | POA: Diagnosis not present

## 2017-11-18 DIAGNOSIS — I739 Peripheral vascular disease, unspecified: Secondary | ICD-10-CM | POA: Diagnosis not present

## 2017-11-18 DIAGNOSIS — I5030 Unspecified diastolic (congestive) heart failure: Secondary | ICD-10-CM | POA: Diagnosis not present

## 2017-11-18 DIAGNOSIS — N189 Chronic kidney disease, unspecified: Secondary | ICD-10-CM | POA: Diagnosis not present

## 2017-11-22 DIAGNOSIS — I739 Peripheral vascular disease, unspecified: Secondary | ICD-10-CM | POA: Diagnosis not present

## 2017-11-22 DIAGNOSIS — N189 Chronic kidney disease, unspecified: Secondary | ICD-10-CM | POA: Diagnosis not present

## 2017-11-22 DIAGNOSIS — E1159 Type 2 diabetes mellitus with other circulatory complications: Secondary | ICD-10-CM | POA: Diagnosis not present

## 2017-11-22 DIAGNOSIS — I5030 Unspecified diastolic (congestive) heart failure: Secondary | ICD-10-CM | POA: Diagnosis not present

## 2017-11-22 DIAGNOSIS — I25119 Atherosclerotic heart disease of native coronary artery with unspecified angina pectoris: Secondary | ICD-10-CM | POA: Diagnosis not present

## 2017-11-22 DIAGNOSIS — I313 Pericardial effusion (noninflammatory): Secondary | ICD-10-CM | POA: Diagnosis not present

## 2017-11-25 DIAGNOSIS — I313 Pericardial effusion (noninflammatory): Secondary | ICD-10-CM | POA: Diagnosis not present

## 2017-11-25 DIAGNOSIS — I5030 Unspecified diastolic (congestive) heart failure: Secondary | ICD-10-CM | POA: Diagnosis not present

## 2017-11-25 DIAGNOSIS — N189 Chronic kidney disease, unspecified: Secondary | ICD-10-CM | POA: Diagnosis not present

## 2017-11-25 DIAGNOSIS — E1159 Type 2 diabetes mellitus with other circulatory complications: Secondary | ICD-10-CM | POA: Diagnosis not present

## 2017-11-25 DIAGNOSIS — I25119 Atherosclerotic heart disease of native coronary artery with unspecified angina pectoris: Secondary | ICD-10-CM | POA: Diagnosis not present

## 2017-11-25 DIAGNOSIS — I739 Peripheral vascular disease, unspecified: Secondary | ICD-10-CM | POA: Diagnosis not present

## 2017-11-29 DIAGNOSIS — N189 Chronic kidney disease, unspecified: Secondary | ICD-10-CM | POA: Diagnosis not present

## 2017-11-29 DIAGNOSIS — I25119 Atherosclerotic heart disease of native coronary artery with unspecified angina pectoris: Secondary | ICD-10-CM | POA: Diagnosis not present

## 2017-11-29 DIAGNOSIS — I739 Peripheral vascular disease, unspecified: Secondary | ICD-10-CM | POA: Diagnosis not present

## 2017-11-29 DIAGNOSIS — E1159 Type 2 diabetes mellitus with other circulatory complications: Secondary | ICD-10-CM | POA: Diagnosis not present

## 2017-11-29 DIAGNOSIS — I5030 Unspecified diastolic (congestive) heart failure: Secondary | ICD-10-CM | POA: Diagnosis not present

## 2017-11-29 DIAGNOSIS — I313 Pericardial effusion (noninflammatory): Secondary | ICD-10-CM | POA: Diagnosis not present

## 2017-12-02 DIAGNOSIS — I739 Peripheral vascular disease, unspecified: Secondary | ICD-10-CM | POA: Diagnosis not present

## 2017-12-02 DIAGNOSIS — I5030 Unspecified diastolic (congestive) heart failure: Secondary | ICD-10-CM | POA: Diagnosis not present

## 2017-12-02 DIAGNOSIS — N189 Chronic kidney disease, unspecified: Secondary | ICD-10-CM | POA: Diagnosis not present

## 2017-12-02 DIAGNOSIS — I313 Pericardial effusion (noninflammatory): Secondary | ICD-10-CM | POA: Diagnosis not present

## 2017-12-02 DIAGNOSIS — E1159 Type 2 diabetes mellitus with other circulatory complications: Secondary | ICD-10-CM | POA: Diagnosis not present

## 2017-12-02 DIAGNOSIS — I25119 Atherosclerotic heart disease of native coronary artery with unspecified angina pectoris: Secondary | ICD-10-CM | POA: Diagnosis not present

## 2017-12-06 DIAGNOSIS — E1159 Type 2 diabetes mellitus with other circulatory complications: Secondary | ICD-10-CM | POA: Diagnosis not present

## 2017-12-06 DIAGNOSIS — I739 Peripheral vascular disease, unspecified: Secondary | ICD-10-CM | POA: Diagnosis not present

## 2017-12-06 DIAGNOSIS — I25119 Atherosclerotic heart disease of native coronary artery with unspecified angina pectoris: Secondary | ICD-10-CM | POA: Diagnosis not present

## 2017-12-06 DIAGNOSIS — N189 Chronic kidney disease, unspecified: Secondary | ICD-10-CM | POA: Diagnosis not present

## 2017-12-06 DIAGNOSIS — I313 Pericardial effusion (noninflammatory): Secondary | ICD-10-CM | POA: Diagnosis not present

## 2017-12-06 DIAGNOSIS — I5030 Unspecified diastolic (congestive) heart failure: Secondary | ICD-10-CM | POA: Diagnosis not present

## 2017-12-08 DIAGNOSIS — I5030 Unspecified diastolic (congestive) heart failure: Secondary | ICD-10-CM | POA: Diagnosis not present

## 2017-12-08 DIAGNOSIS — I739 Peripheral vascular disease, unspecified: Secondary | ICD-10-CM | POA: Diagnosis not present

## 2017-12-08 DIAGNOSIS — I313 Pericardial effusion (noninflammatory): Secondary | ICD-10-CM | POA: Diagnosis not present

## 2017-12-08 DIAGNOSIS — I25119 Atherosclerotic heart disease of native coronary artery with unspecified angina pectoris: Secondary | ICD-10-CM | POA: Diagnosis not present

## 2017-12-08 DIAGNOSIS — N189 Chronic kidney disease, unspecified: Secondary | ICD-10-CM | POA: Diagnosis not present

## 2017-12-08 DIAGNOSIS — E1159 Type 2 diabetes mellitus with other circulatory complications: Secondary | ICD-10-CM | POA: Diagnosis not present

## 2017-12-09 DIAGNOSIS — I313 Pericardial effusion (noninflammatory): Secondary | ICD-10-CM | POA: Diagnosis not present

## 2017-12-09 DIAGNOSIS — I5030 Unspecified diastolic (congestive) heart failure: Secondary | ICD-10-CM | POA: Diagnosis not present

## 2017-12-09 DIAGNOSIS — I739 Peripheral vascular disease, unspecified: Secondary | ICD-10-CM | POA: Diagnosis not present

## 2017-12-09 DIAGNOSIS — N189 Chronic kidney disease, unspecified: Secondary | ICD-10-CM | POA: Diagnosis not present

## 2017-12-09 DIAGNOSIS — E1159 Type 2 diabetes mellitus with other circulatory complications: Secondary | ICD-10-CM | POA: Diagnosis not present

## 2017-12-09 DIAGNOSIS — I25119 Atherosclerotic heart disease of native coronary artery with unspecified angina pectoris: Secondary | ICD-10-CM | POA: Diagnosis not present

## 2017-12-10 ENCOUNTER — Telehealth: Payer: Self-pay | Admitting: Internal Medicine

## 2017-12-10 DIAGNOSIS — N189 Chronic kidney disease, unspecified: Secondary | ICD-10-CM | POA: Diagnosis not present

## 2017-12-10 DIAGNOSIS — M1990 Unspecified osteoarthritis, unspecified site: Secondary | ICD-10-CM | POA: Diagnosis not present

## 2017-12-10 DIAGNOSIS — I4891 Unspecified atrial fibrillation: Secondary | ICD-10-CM | POA: Diagnosis not present

## 2017-12-10 DIAGNOSIS — E785 Hyperlipidemia, unspecified: Secondary | ICD-10-CM | POA: Diagnosis not present

## 2017-12-10 DIAGNOSIS — I739 Peripheral vascular disease, unspecified: Secondary | ICD-10-CM | POA: Diagnosis not present

## 2017-12-10 DIAGNOSIS — I5030 Unspecified diastolic (congestive) heart failure: Secondary | ICD-10-CM | POA: Diagnosis not present

## 2017-12-10 DIAGNOSIS — I10 Essential (primary) hypertension: Secondary | ICD-10-CM | POA: Diagnosis not present

## 2017-12-10 DIAGNOSIS — J301 Allergic rhinitis due to pollen: Secondary | ICD-10-CM | POA: Diagnosis not present

## 2017-12-10 DIAGNOSIS — I313 Pericardial effusion (noninflammatory): Secondary | ICD-10-CM | POA: Diagnosis not present

## 2017-12-10 DIAGNOSIS — E1159 Type 2 diabetes mellitus with other circulatory complications: Secondary | ICD-10-CM | POA: Diagnosis not present

## 2017-12-10 DIAGNOSIS — M109 Gout, unspecified: Secondary | ICD-10-CM | POA: Diagnosis not present

## 2017-12-10 DIAGNOSIS — N401 Enlarged prostate with lower urinary tract symptoms: Secondary | ICD-10-CM | POA: Diagnosis not present

## 2017-12-10 DIAGNOSIS — I25119 Atherosclerotic heart disease of native coronary artery with unspecified angina pectoris: Secondary | ICD-10-CM | POA: Diagnosis not present

## 2017-12-10 NOTE — Telephone Encounter (Signed)
Ok with me 

## 2017-12-10 NOTE — Telephone Encounter (Signed)
Called Patty to make her aware it was ok per PCP.

## 2017-12-10 NOTE — Telephone Encounter (Signed)
Copied from Lydia [phone]. Topic: Quick Communication - See Telephone Encounter >> Dec 10, 2017  2:25 PM Conception Chancy, NT wrote: CRM for notification. See Telephone encounter for: 12/10/17.  Chong Sicilian is calling from [facility] and states the patient meets criteria continue hospice care and they will be doing a recertification unless [name] suggest otherwise.   Cb# [phone]

## 2017-12-13 DIAGNOSIS — N189 Chronic kidney disease, unspecified: Secondary | ICD-10-CM | POA: Diagnosis not present

## 2017-12-13 DIAGNOSIS — I739 Peripheral vascular disease, unspecified: Secondary | ICD-10-CM | POA: Diagnosis not present

## 2017-12-13 DIAGNOSIS — I25119 Atherosclerotic heart disease of native coronary artery with unspecified angina pectoris: Secondary | ICD-10-CM | POA: Diagnosis not present

## 2017-12-13 DIAGNOSIS — I5030 Unspecified diastolic (congestive) heart failure: Secondary | ICD-10-CM | POA: Diagnosis not present

## 2017-12-13 DIAGNOSIS — I313 Pericardial effusion (noninflammatory): Secondary | ICD-10-CM | POA: Diagnosis not present

## 2017-12-13 DIAGNOSIS — E1159 Type 2 diabetes mellitus with other circulatory complications: Secondary | ICD-10-CM | POA: Diagnosis not present

## 2017-12-15 DIAGNOSIS — I313 Pericardial effusion (noninflammatory): Secondary | ICD-10-CM | POA: Diagnosis not present

## 2017-12-15 DIAGNOSIS — I25119 Atherosclerotic heart disease of native coronary artery with unspecified angina pectoris: Secondary | ICD-10-CM | POA: Diagnosis not present

## 2017-12-15 DIAGNOSIS — N189 Chronic kidney disease, unspecified: Secondary | ICD-10-CM | POA: Diagnosis not present

## 2017-12-15 DIAGNOSIS — E1159 Type 2 diabetes mellitus with other circulatory complications: Secondary | ICD-10-CM | POA: Diagnosis not present

## 2017-12-15 DIAGNOSIS — I739 Peripheral vascular disease, unspecified: Secondary | ICD-10-CM | POA: Diagnosis not present

## 2017-12-15 DIAGNOSIS — I5030 Unspecified diastolic (congestive) heart failure: Secondary | ICD-10-CM | POA: Diagnosis not present

## 2017-12-16 DIAGNOSIS — I313 Pericardial effusion (noninflammatory): Secondary | ICD-10-CM | POA: Diagnosis not present

## 2017-12-16 DIAGNOSIS — N189 Chronic kidney disease, unspecified: Secondary | ICD-10-CM | POA: Diagnosis not present

## 2017-12-16 DIAGNOSIS — I25119 Atherosclerotic heart disease of native coronary artery with unspecified angina pectoris: Secondary | ICD-10-CM | POA: Diagnosis not present

## 2017-12-16 DIAGNOSIS — I5030 Unspecified diastolic (congestive) heart failure: Secondary | ICD-10-CM | POA: Diagnosis not present

## 2017-12-16 DIAGNOSIS — E1159 Type 2 diabetes mellitus with other circulatory complications: Secondary | ICD-10-CM | POA: Diagnosis not present

## 2017-12-16 DIAGNOSIS — I739 Peripheral vascular disease, unspecified: Secondary | ICD-10-CM | POA: Diagnosis not present

## 2017-12-17 DIAGNOSIS — N189 Chronic kidney disease, unspecified: Secondary | ICD-10-CM | POA: Diagnosis not present

## 2017-12-17 DIAGNOSIS — I313 Pericardial effusion (noninflammatory): Secondary | ICD-10-CM | POA: Diagnosis not present

## 2017-12-17 DIAGNOSIS — E1159 Type 2 diabetes mellitus with other circulatory complications: Secondary | ICD-10-CM | POA: Diagnosis not present

## 2017-12-17 DIAGNOSIS — I5030 Unspecified diastolic (congestive) heart failure: Secondary | ICD-10-CM | POA: Diagnosis not present

## 2017-12-17 DIAGNOSIS — I25119 Atherosclerotic heart disease of native coronary artery with unspecified angina pectoris: Secondary | ICD-10-CM | POA: Diagnosis not present

## 2017-12-17 DIAGNOSIS — I739 Peripheral vascular disease, unspecified: Secondary | ICD-10-CM | POA: Diagnosis not present

## 2017-12-19 DIAGNOSIS — N189 Chronic kidney disease, unspecified: Secondary | ICD-10-CM | POA: Diagnosis not present

## 2017-12-19 DIAGNOSIS — I25119 Atherosclerotic heart disease of native coronary artery with unspecified angina pectoris: Secondary | ICD-10-CM | POA: Diagnosis not present

## 2017-12-19 DIAGNOSIS — I5030 Unspecified diastolic (congestive) heart failure: Secondary | ICD-10-CM | POA: Diagnosis not present

## 2017-12-19 DIAGNOSIS — I739 Peripheral vascular disease, unspecified: Secondary | ICD-10-CM | POA: Diagnosis not present

## 2017-12-19 DIAGNOSIS — E1159 Type 2 diabetes mellitus with other circulatory complications: Secondary | ICD-10-CM | POA: Diagnosis not present

## 2017-12-19 DIAGNOSIS — I313 Pericardial effusion (noninflammatory): Secondary | ICD-10-CM | POA: Diagnosis not present

## 2017-12-20 DIAGNOSIS — I5030 Unspecified diastolic (congestive) heart failure: Secondary | ICD-10-CM | POA: Diagnosis not present

## 2017-12-20 DIAGNOSIS — I313 Pericardial effusion (noninflammatory): Secondary | ICD-10-CM | POA: Diagnosis not present

## 2017-12-20 DIAGNOSIS — E1159 Type 2 diabetes mellitus with other circulatory complications: Secondary | ICD-10-CM | POA: Diagnosis not present

## 2017-12-20 DIAGNOSIS — N189 Chronic kidney disease, unspecified: Secondary | ICD-10-CM | POA: Diagnosis not present

## 2017-12-20 DIAGNOSIS — I25119 Atherosclerotic heart disease of native coronary artery with unspecified angina pectoris: Secondary | ICD-10-CM | POA: Diagnosis not present

## 2017-12-20 DIAGNOSIS — I739 Peripheral vascular disease, unspecified: Secondary | ICD-10-CM | POA: Diagnosis not present

## 2017-12-22 DIAGNOSIS — I25119 Atherosclerotic heart disease of native coronary artery with unspecified angina pectoris: Secondary | ICD-10-CM | POA: Diagnosis not present

## 2017-12-22 DIAGNOSIS — I5030 Unspecified diastolic (congestive) heart failure: Secondary | ICD-10-CM | POA: Diagnosis not present

## 2017-12-22 DIAGNOSIS — N189 Chronic kidney disease, unspecified: Secondary | ICD-10-CM | POA: Diagnosis not present

## 2017-12-22 DIAGNOSIS — I313 Pericardial effusion (noninflammatory): Secondary | ICD-10-CM | POA: Diagnosis not present

## 2017-12-22 DIAGNOSIS — I739 Peripheral vascular disease, unspecified: Secondary | ICD-10-CM | POA: Diagnosis not present

## 2017-12-22 DIAGNOSIS — E1159 Type 2 diabetes mellitus with other circulatory complications: Secondary | ICD-10-CM | POA: Diagnosis not present

## 2017-12-23 DIAGNOSIS — I739 Peripheral vascular disease, unspecified: Secondary | ICD-10-CM | POA: Diagnosis not present

## 2017-12-23 DIAGNOSIS — E1159 Type 2 diabetes mellitus with other circulatory complications: Secondary | ICD-10-CM | POA: Diagnosis not present

## 2017-12-23 DIAGNOSIS — I313 Pericardial effusion (noninflammatory): Secondary | ICD-10-CM | POA: Diagnosis not present

## 2017-12-23 DIAGNOSIS — I5030 Unspecified diastolic (congestive) heart failure: Secondary | ICD-10-CM | POA: Diagnosis not present

## 2017-12-23 DIAGNOSIS — N189 Chronic kidney disease, unspecified: Secondary | ICD-10-CM | POA: Diagnosis not present

## 2017-12-23 DIAGNOSIS — I25119 Atherosclerotic heart disease of native coronary artery with unspecified angina pectoris: Secondary | ICD-10-CM | POA: Diagnosis not present

## 2017-12-24 DIAGNOSIS — N189 Chronic kidney disease, unspecified: Secondary | ICD-10-CM | POA: Diagnosis not present

## 2017-12-24 DIAGNOSIS — I313 Pericardial effusion (noninflammatory): Secondary | ICD-10-CM | POA: Diagnosis not present

## 2017-12-24 DIAGNOSIS — E1159 Type 2 diabetes mellitus with other circulatory complications: Secondary | ICD-10-CM | POA: Diagnosis not present

## 2017-12-24 DIAGNOSIS — I5030 Unspecified diastolic (congestive) heart failure: Secondary | ICD-10-CM | POA: Diagnosis not present

## 2017-12-24 DIAGNOSIS — I25119 Atherosclerotic heart disease of native coronary artery with unspecified angina pectoris: Secondary | ICD-10-CM | POA: Diagnosis not present

## 2017-12-24 DIAGNOSIS — I739 Peripheral vascular disease, unspecified: Secondary | ICD-10-CM | POA: Diagnosis not present

## 2017-12-27 DIAGNOSIS — I5030 Unspecified diastolic (congestive) heart failure: Secondary | ICD-10-CM | POA: Diagnosis not present

## 2017-12-27 DIAGNOSIS — E1159 Type 2 diabetes mellitus with other circulatory complications: Secondary | ICD-10-CM | POA: Diagnosis not present

## 2017-12-27 DIAGNOSIS — I25119 Atherosclerotic heart disease of native coronary artery with unspecified angina pectoris: Secondary | ICD-10-CM | POA: Diagnosis not present

## 2017-12-27 DIAGNOSIS — I739 Peripheral vascular disease, unspecified: Secondary | ICD-10-CM | POA: Diagnosis not present

## 2017-12-27 DIAGNOSIS — I313 Pericardial effusion (noninflammatory): Secondary | ICD-10-CM | POA: Diagnosis not present

## 2017-12-27 DIAGNOSIS — N189 Chronic kidney disease, unspecified: Secondary | ICD-10-CM | POA: Diagnosis not present

## 2017-12-30 DIAGNOSIS — I5030 Unspecified diastolic (congestive) heart failure: Secondary | ICD-10-CM | POA: Diagnosis not present

## 2017-12-30 DIAGNOSIS — I739 Peripheral vascular disease, unspecified: Secondary | ICD-10-CM | POA: Diagnosis not present

## 2017-12-30 DIAGNOSIS — I25119 Atherosclerotic heart disease of native coronary artery with unspecified angina pectoris: Secondary | ICD-10-CM | POA: Diagnosis not present

## 2017-12-30 DIAGNOSIS — I313 Pericardial effusion (noninflammatory): Secondary | ICD-10-CM | POA: Diagnosis not present

## 2017-12-30 DIAGNOSIS — N189 Chronic kidney disease, unspecified: Secondary | ICD-10-CM | POA: Diagnosis not present

## 2017-12-30 DIAGNOSIS — E1159 Type 2 diabetes mellitus with other circulatory complications: Secondary | ICD-10-CM | POA: Diagnosis not present

## 2017-12-31 DIAGNOSIS — I313 Pericardial effusion (noninflammatory): Secondary | ICD-10-CM | POA: Diagnosis not present

## 2017-12-31 DIAGNOSIS — N189 Chronic kidney disease, unspecified: Secondary | ICD-10-CM | POA: Diagnosis not present

## 2017-12-31 DIAGNOSIS — I5030 Unspecified diastolic (congestive) heart failure: Secondary | ICD-10-CM | POA: Diagnosis not present

## 2017-12-31 DIAGNOSIS — I739 Peripheral vascular disease, unspecified: Secondary | ICD-10-CM | POA: Diagnosis not present

## 2017-12-31 DIAGNOSIS — I25119 Atherosclerotic heart disease of native coronary artery with unspecified angina pectoris: Secondary | ICD-10-CM | POA: Diagnosis not present

## 2017-12-31 DIAGNOSIS — E1159 Type 2 diabetes mellitus with other circulatory complications: Secondary | ICD-10-CM | POA: Diagnosis not present

## 2018-01-03 DIAGNOSIS — I313 Pericardial effusion (noninflammatory): Secondary | ICD-10-CM | POA: Diagnosis not present

## 2018-01-03 DIAGNOSIS — I739 Peripheral vascular disease, unspecified: Secondary | ICD-10-CM | POA: Diagnosis not present

## 2018-01-03 DIAGNOSIS — N189 Chronic kidney disease, unspecified: Secondary | ICD-10-CM | POA: Diagnosis not present

## 2018-01-03 DIAGNOSIS — I5030 Unspecified diastolic (congestive) heart failure: Secondary | ICD-10-CM | POA: Diagnosis not present

## 2018-01-03 DIAGNOSIS — I25119 Atherosclerotic heart disease of native coronary artery with unspecified angina pectoris: Secondary | ICD-10-CM | POA: Diagnosis not present

## 2018-01-03 DIAGNOSIS — E1159 Type 2 diabetes mellitus with other circulatory complications: Secondary | ICD-10-CM | POA: Diagnosis not present

## 2018-01-05 ENCOUNTER — Telehealth: Payer: Self-pay | Admitting: Internal Medicine

## 2018-01-05 DIAGNOSIS — N189 Chronic kidney disease, unspecified: Secondary | ICD-10-CM | POA: Diagnosis not present

## 2018-01-05 DIAGNOSIS — E1159 Type 2 diabetes mellitus with other circulatory complications: Secondary | ICD-10-CM | POA: Diagnosis not present

## 2018-01-05 DIAGNOSIS — I739 Peripheral vascular disease, unspecified: Secondary | ICD-10-CM | POA: Diagnosis not present

## 2018-01-05 DIAGNOSIS — I313 Pericardial effusion (noninflammatory): Secondary | ICD-10-CM | POA: Diagnosis not present

## 2018-01-05 DIAGNOSIS — I25119 Atherosclerotic heart disease of native coronary artery with unspecified angina pectoris: Secondary | ICD-10-CM | POA: Diagnosis not present

## 2018-01-05 DIAGNOSIS — I5030 Unspecified diastolic (congestive) heart failure: Secondary | ICD-10-CM | POA: Diagnosis not present

## 2018-01-05 NOTE — Telephone Encounter (Signed)
12/21/2017 [facility] [employer] brought D/C to be signed. Took D/C to [name] at [facility]. Call [phone] Peter Garter and Barbarann Ehlers when ready to Pick Up.  PWR 12/15/2017  Received D/C back from Dr. Syliva Overman Peter Garter and Barbarann Ehlers [phone] they will pick up today. Pwr 01/07/18

## 2018-01-09 DEATH — deceased
